# Patient Record
Sex: Female | Born: 1935 | Race: White | Hispanic: No | Marital: Married | Smoking: Former smoker
Health system: Southern US, Community
[De-identification: ages and names within clinical notes are randomized; demographics above are authoritative.]

## PROBLEM LIST (undated history)

## (undated) DIAGNOSIS — Z9989 Dependence on other enabling machines and devices: Secondary | ICD-10-CM

## (undated) DIAGNOSIS — I5042 Chronic combined systolic (congestive) and diastolic (congestive) heart failure: Secondary | ICD-10-CM

## (undated) DIAGNOSIS — N183 Chronic kidney disease, stage 3 unspecified: Secondary | ICD-10-CM

## (undated) DIAGNOSIS — I350 Nonrheumatic aortic (valve) stenosis: Secondary | ICD-10-CM

## (undated) DIAGNOSIS — G4733 Obstructive sleep apnea (adult) (pediatric): Secondary | ICD-10-CM

## (undated) DIAGNOSIS — I739 Peripheral vascular disease, unspecified: Secondary | ICD-10-CM

## (undated) DIAGNOSIS — D649 Anemia, unspecified: Secondary | ICD-10-CM

## (undated) DIAGNOSIS — M19072 Primary osteoarthritis, left ankle and foot: Secondary | ICD-10-CM

## (undated) DIAGNOSIS — E559 Vitamin D deficiency, unspecified: Secondary | ICD-10-CM

## (undated) DIAGNOSIS — I4819 Other persistent atrial fibrillation: Secondary | ICD-10-CM

## (undated) DIAGNOSIS — I779 Disorder of arteries and arterioles, unspecified: Secondary | ICD-10-CM

## (undated) DIAGNOSIS — M19071 Primary osteoarthritis, right ankle and foot: Secondary | ICD-10-CM

## (undated) DIAGNOSIS — E119 Type 2 diabetes mellitus without complications: Secondary | ICD-10-CM

## (undated) DIAGNOSIS — I10 Essential (primary) hypertension: Secondary | ICD-10-CM

## (undated) HISTORY — DX: Chronic kidney disease, stage 3 unspecified: N18.30

## (undated) HISTORY — DX: Chronic kidney disease, stage 3 (moderate): N18.3

## (undated) HISTORY — PX: CHOLECYSTECTOMY: SHX55

## (undated) HISTORY — DX: Essential (primary) hypertension: I10

## (undated) HISTORY — DX: Chronic combined systolic (congestive) and diastolic (congestive) heart failure: I50.42

## (undated) HISTORY — DX: Other persistent atrial fibrillation: I48.19

## (undated) HISTORY — DX: Nonrheumatic aortic (valve) stenosis: I35.0

---

## 2014-08-23 ENCOUNTER — Inpatient Hospital Stay (HOSPITAL_COMMUNITY)
Admission: EM | Admit: 2014-08-23 | Discharge: 2014-08-28 | DRG: 308 | Disposition: A | Discharge status: Home / Self Care | Payer: Medicare Other | Attending: Cardiology | Admitting: Cardiology

## 2014-08-23 ENCOUNTER — Encounter (HOSPITAL_COMMUNITY): Payer: Self-pay | Admitting: Emergency Medicine

## 2014-08-23 ENCOUNTER — Emergency Department (HOSPITAL_COMMUNITY): Payer: Medicare Other

## 2014-08-23 DIAGNOSIS — M19071 Primary osteoarthritis, right ankle and foot: Secondary | ICD-10-CM | POA: Diagnosis present

## 2014-08-23 DIAGNOSIS — M19072 Primary osteoarthritis, left ankle and foot: Secondary | ICD-10-CM | POA: Diagnosis present

## 2014-08-23 DIAGNOSIS — N184 Chronic kidney disease, stage 4 (severe): Secondary | ICD-10-CM | POA: Diagnosis present

## 2014-08-23 DIAGNOSIS — Z87891 Personal history of nicotine dependence: Secondary | ICD-10-CM | POA: Diagnosis not present

## 2014-08-23 DIAGNOSIS — I4891 Unspecified atrial fibrillation: Secondary | ICD-10-CM | POA: Diagnosis not present

## 2014-08-23 DIAGNOSIS — Z79899 Other long term (current) drug therapy: Secondary | ICD-10-CM

## 2014-08-23 DIAGNOSIS — I481 Persistent atrial fibrillation: Secondary | ICD-10-CM | POA: Diagnosis present

## 2014-08-23 DIAGNOSIS — E6609 Other obesity due to excess calories: Secondary | ICD-10-CM | POA: Diagnosis present

## 2014-08-23 DIAGNOSIS — K5900 Constipation, unspecified: Secondary | ICD-10-CM | POA: Diagnosis present

## 2014-08-23 DIAGNOSIS — M79605 Pain in left leg: Secondary | ICD-10-CM | POA: Diagnosis not present

## 2014-08-23 DIAGNOSIS — I10 Essential (primary) hypertension: Secondary | ICD-10-CM | POA: Diagnosis not present

## 2014-08-23 DIAGNOSIS — Q231 Congenital insufficiency of aortic valve: Secondary | ICD-10-CM

## 2014-08-23 DIAGNOSIS — D649 Anemia, unspecified: Secondary | ICD-10-CM | POA: Diagnosis present

## 2014-08-23 DIAGNOSIS — I5031 Acute diastolic (congestive) heart failure: Secondary | ICD-10-CM | POA: Diagnosis present

## 2014-08-23 DIAGNOSIS — E119 Type 2 diabetes mellitus without complications: Secondary | ICD-10-CM | POA: Diagnosis present

## 2014-08-23 DIAGNOSIS — I4510 Unspecified right bundle-branch block: Secondary | ICD-10-CM | POA: Diagnosis present

## 2014-08-23 DIAGNOSIS — N183 Chronic kidney disease, stage 3 (moderate): Secondary | ICD-10-CM

## 2014-08-23 DIAGNOSIS — E559 Vitamin D deficiency, unspecified: Secondary | ICD-10-CM | POA: Diagnosis present

## 2014-08-23 DIAGNOSIS — R609 Edema, unspecified: Secondary | ICD-10-CM

## 2014-08-23 DIAGNOSIS — I77810 Thoracic aortic ectasia: Secondary | ICD-10-CM | POA: Diagnosis present

## 2014-08-23 DIAGNOSIS — M25561 Pain in right knee: Secondary | ICD-10-CM | POA: Diagnosis not present

## 2014-08-23 DIAGNOSIS — Z794 Long term (current) use of insulin: Secondary | ICD-10-CM

## 2014-08-23 DIAGNOSIS — R0609 Other forms of dyspnea: Secondary | ICD-10-CM | POA: Diagnosis present

## 2014-08-23 DIAGNOSIS — E785 Hyperlipidemia, unspecified: Secondary | ICD-10-CM | POA: Diagnosis present

## 2014-08-23 DIAGNOSIS — G4733 Obstructive sleep apnea (adult) (pediatric): Secondary | ICD-10-CM | POA: Diagnosis present

## 2014-08-23 DIAGNOSIS — Z6838 Body mass index (BMI) 38.0-38.9, adult: Secondary | ICD-10-CM

## 2014-08-23 DIAGNOSIS — I129 Hypertensive chronic kidney disease with stage 1 through stage 4 chronic kidney disease, or unspecified chronic kidney disease: Secondary | ICD-10-CM | POA: Diagnosis present

## 2014-08-23 HISTORY — DX: Primary osteoarthritis, right ankle and foot: M19.072

## 2014-08-23 HISTORY — DX: Disorder of arteries and arterioles, unspecified: I77.9

## 2014-08-23 HISTORY — DX: Anemia, unspecified: D64.9

## 2014-08-23 HISTORY — DX: Obstructive sleep apnea (adult) (pediatric): G47.33

## 2014-08-23 HISTORY — DX: Type 2 diabetes mellitus without complications: E11.9

## 2014-08-23 HISTORY — DX: Dependence on other enabling machines and devices: Z99.89

## 2014-08-23 HISTORY — DX: Vitamin D deficiency, unspecified: E55.9

## 2014-08-23 HISTORY — DX: Primary osteoarthritis, right ankle and foot: M19.071

## 2014-08-23 HISTORY — DX: Peripheral vascular disease, unspecified: I73.9

## 2014-08-23 LAB — ABO/RH: ABO/RH(D): O POS

## 2014-08-23 LAB — COMPREHENSIVE METABOLIC PANEL
ALT: 14 U/L (ref 14–54)
ANION GAP: 9 (ref 5–15)
AST: 16 U/L (ref 15–41)
Albumin: 3.7 g/dL (ref 3.5–5.0)
Alkaline Phosphatase: 58 U/L (ref 38–126)
BUN: 16 mg/dL (ref 6–20)
CHLORIDE: 105 mmol/L (ref 101–111)
CO2: 24 mmol/L (ref 22–32)
Calcium: 8.8 mg/dL — ABNORMAL LOW (ref 8.9–10.3)
Creatinine, Ser: 1.15 mg/dL — ABNORMAL HIGH (ref 0.44–1.00)
GFR calc Af Amer: 52 mL/min — ABNORMAL LOW (ref >60)
GFR, EST NON AFRICAN AMERICAN: 45 mL/min — AB (ref >60)
Glucose, Bld: 150 mg/dL — ABNORMAL HIGH (ref 65–99)
Potassium: 4.2 mmol/L (ref 3.5–5.1)
Sodium: 138 mmol/L (ref 135–145)
TOTAL PROTEIN: 6.7 g/dL (ref 6.5–8.1)
Total Bilirubin: 0.5 mg/dL (ref 0.3–1.2)

## 2014-08-23 LAB — URINE MICROSCOPIC-ADD ON

## 2014-08-23 LAB — URINALYSIS, ROUTINE W REFLEX MICROSCOPIC
Bilirubin Urine: NEGATIVE
Glucose, UA: NEGATIVE mg/dL
Hgb urine dipstick: NEGATIVE
KETONES UR: NEGATIVE mg/dL
Nitrite: NEGATIVE
PH: 6.5 (ref 5.0–8.0)
PROTEIN: NEGATIVE mg/dL
Specific Gravity, Urine: 1.007 (ref 1.005–1.030)
UROBILINOGEN UA: 0.2 mg/dL (ref 0.0–1.0)

## 2014-08-23 LAB — PROTIME-INR
INR: 1.32 (ref 0.00–1.49)
PROTHROMBIN TIME: 16.5 s — AB (ref 11.6–15.2)

## 2014-08-23 LAB — CBC WITH DIFFERENTIAL/PLATELET
Basophils Absolute: 0.1 10*3/uL (ref 0.0–0.1)
Basophils Relative: 1 % (ref 0–1)
Eosinophils Absolute: 0.4 10*3/uL (ref 0.0–0.7)
Eosinophils Relative: 6 % — ABNORMAL HIGH (ref 0–5)
HCT: 36.8 % (ref 36.0–46.0)
Hemoglobin: 11.6 g/dL — ABNORMAL LOW (ref 12.0–15.0)
LYMPHS ABS: 1.9 10*3/uL (ref 0.7–4.0)
Lymphocytes Relative: 27 % (ref 12–46)
MCH: 27.4 pg (ref 26.0–34.0)
MCHC: 31.5 g/dL (ref 30.0–36.0)
MCV: 87 fL (ref 78.0–100.0)
MONOS PCT: 6 % (ref 3–12)
Monocytes Absolute: 0.4 10*3/uL (ref 0.1–1.0)
Neutro Abs: 4.3 10*3/uL (ref 1.7–7.7)
Neutrophils Relative %: 60 % (ref 43–77)
Platelets: 238 10*3/uL (ref 150–400)
RBC: 4.23 MIL/uL (ref 3.87–5.11)
RDW: 14.1 % (ref 11.5–15.5)
WBC: 7 10*3/uL (ref 4.0–10.5)

## 2014-08-23 LAB — BRAIN NATRIURETIC PEPTIDE: B Natriuretic Peptide: 234.7 pg/mL — ABNORMAL HIGH (ref 0.0–100.0)

## 2014-08-23 LAB — TROPONIN I
Troponin I: <0.03 ng/mL (ref <0.031)
Troponin I: <0.03 ng/mL (ref <0.031)

## 2014-08-23 LAB — GLUCOSE, CAPILLARY
GLUCOSE-CAPILLARY: 84 mg/dL (ref 65–99)
GLUCOSE-CAPILLARY: 105 mg/dL — AB (ref 65–99)

## 2014-08-23 LAB — TYPE AND SCREEN
ABO/RH(D): O POS
Antibody Screen: NEGATIVE

## 2014-08-23 LAB — MRSA PCR SCREENING: MRSA by PCR: NEGATIVE

## 2014-08-23 MED ORDER — METOPROLOL TARTRATE 12.5 MG HALF TABLET
12.5000 mg | ORAL_TABLET | Freq: Two times a day (BID) | ORAL | Status: DC
  Administered 2014-08-23: 12.5 mg via ORAL
  Filled 2014-08-23 (×3): qty 1

## 2014-08-23 MED ORDER — INSULIN ASPART 100 UNIT/ML ~~LOC~~ SOLN
0.0000 [IU] | Freq: Every day | SUBCUTANEOUS | Status: DC
  Administered 2014-08-25: 3 [IU] via SUBCUTANEOUS
  Administered 2014-08-26: 2 [IU] via SUBCUTANEOUS
  Administered 2014-08-27 (×2): 3 [IU] via SUBCUTANEOUS

## 2014-08-23 MED ORDER — HEPARIN BOLUS VIA INFUSION
4000.0000 [IU] | Freq: Once | INTRAVENOUS | Status: AC
  Administered 2014-08-23: 4000 [IU] via INTRAVENOUS
  Filled 2014-08-23: qty 4000

## 2014-08-23 MED ORDER — DILTIAZEM HCL 100 MG IV SOLR
5.0000 mg/h | INTRAVENOUS | Status: DC
  Administered 2014-08-23: 7.5 mg/h via INTRAVENOUS
  Administered 2014-08-23: 5 mg/h via INTRAVENOUS

## 2014-08-23 MED ORDER — HEPARIN (PORCINE) IN NACL 100-0.45 UNIT/ML-% IJ SOLN
1000.0000 [IU]/h | INTRAMUSCULAR | Status: DC
  Administered 2014-08-23: 1000 [IU]/h via INTRAVENOUS
  Filled 2014-08-23 (×2): qty 250

## 2014-08-23 MED ORDER — FUROSEMIDE 10 MG/ML IJ SOLN
40.0000 mg | Freq: Once | INTRAMUSCULAR | Status: AC
  Administered 2014-08-23: 40 mg via INTRAVENOUS
  Filled 2014-08-23: qty 4

## 2014-08-23 MED ORDER — ACETAMINOPHEN 325 MG PO TABS
650.0000 mg | ORAL_TABLET | ORAL | Status: DC | PRN
  Administered 2014-08-23: 650 mg via ORAL
  Filled 2014-08-23: qty 2

## 2014-08-23 MED ORDER — SODIUM CHLORIDE 0.9 % IV SOLN: INTRAVENOUS | Status: DC

## 2014-08-23 MED ORDER — INSULIN ASPART 100 UNIT/ML ~~LOC~~ SOLN
0.0000 [IU] | Freq: Three times a day (TID) | SUBCUTANEOUS | Status: DC
  Administered 2014-08-24: 2 [IU] via SUBCUTANEOUS
  Administered 2014-08-24: 5 [IU] via SUBCUTANEOUS
  Administered 2014-08-25: 2 [IU] via SUBCUTANEOUS
  Administered 2014-08-25: 7 [IU] via SUBCUTANEOUS
  Administered 2014-08-25: 2 [IU] via SUBCUTANEOUS
  Administered 2014-08-26 – 2014-08-27 (×4): 3 [IU] via SUBCUTANEOUS
  Administered 2014-08-27: 2 [IU] via SUBCUTANEOUS
  Administered 2014-08-27: 1 [IU] via SUBCUTANEOUS
  Administered 2014-08-28: 2 [IU] via SUBCUTANEOUS

## 2014-08-23 MED ORDER — ONDANSETRON HCL 4 MG/2ML IJ SOLN: 4.0000 mg | Freq: Four times a day (QID) | INTRAMUSCULAR | Status: DC | PRN

## 2014-08-23 MED ORDER — ASPIRIN EC 81 MG PO TBEC
81.0000 mg | DELAYED_RELEASE_TABLET | Freq: Every day | ORAL | Status: DC
  Administered 2014-08-23 – 2014-08-28 (×6): 81 mg via ORAL
  Filled 2014-08-23 (×6): qty 1

## 2014-08-23 MED ORDER — ALPRAZOLAM 0.25 MG PO TABS
0.2500 mg | ORAL_TABLET | Freq: Two times a day (BID) | ORAL | Status: DC | PRN
  Administered 2014-08-23: 0.25 mg via ORAL

## 2014-08-23 MED ORDER — DILTIAZEM LOAD VIA INFUSION
15.0000 mg | Freq: Once | INTRAVENOUS | Status: AC
  Administered 2014-08-23: 15 mg via INTRAVENOUS
  Filled 2014-08-23: qty 15

## 2014-08-23 NOTE — H&P (Signed)
Lisa [name] is an 79 y.o. female.    Primary Cardiologist:new  No primary care provider on file. followed at [place] Complaint:  HPI: 79 year old female with hx heart murmur, OSA with CPAP that she uses at times, +HTN and diabetes, from [place] speaks [place] as does her husband, presents from PCP - She was there for Echo for DOE at [facility] and found to be in a fib with RVR at 165.  Was given adenosine 6 mg then 12 mg by EMS without change.  Now on IV dilt HR has improved to 108.    With interpretor she states she had significant fatigue about 10 days ago.  No cardiac Hx. And no strokes.  Today she went for Echo, she does have some DOE.  Troponin pending.  . EKG A fib with RVR and RBBB & LPFB.   On visit at [facility] she denies chest pain some DOE and no awareness or racing HR.    Last Nuc 2011 per their records, I do not have results.     Past Medical History  Diagnosis Date  . CHF (congestive heart failure)   . Anemia   . Hypertension   . Diabetes mellitus without complication   . Heart murmur   . Vitamin D deficiency   . CKD (chronic kidney disease) stage 4, GFR 15-29 ml/min     here stage 3  . Carotid arterial disease   . Osteoarthritis of both ankles   . H/O aortic valve disorder   . OSA on CPAP     Past Surgical History  Procedure Laterality Date  . Cholecystectomy      History reviewed. No pertinent family history.- no CAD  Social History:  reports that she has quit smoking. She has never used smokeless tobacco. She reports that she does not drink alcohol or use illicit drugs.  Married and her husband is in [place] as well.  Allergies: No Known Allergies  OUTPATIENT MEDICATIONS: No current facility-administered medications on file prior to encounter.   No current outpatient prescriptions on file prior to encounter.   On INSULIN ONCE a Day. Water pill prn HTN meds.   Results for orders placed or performed during the hospital  encounter of 08/23/14 (from the past 48 hour(s))  Comprehensive metabolic panel     Status: Abnormal   Collection Time: 08/23/14  4:33 PM  Result Value Ref Range   Sodium 138 135 - 145 mmol/L   Potassium 4.2 3.5 - 5.1 mmol/L   Chloride 105 101 - 111 mmol/L   CO2 24 22 - 32 mmol/L   Glucose, Bld 150 (H) 65 - 99 mg/dL   BUN 16 6 - 20 mg/dL   Creatinine, Ser 1.15 (H) 0.44 - 1.00 mg/dL   Calcium 8.8 (L) 8.9 - 10.3 mg/dL   Total Protein 6.7 6.5 - 8.1 g/dL   Albumin 3.7 3.5 - 5.0 g/dL   AST 16 15 - 41 U/L   ALT 14 14 - 54 U/L   Alkaline Phosphatase 58 38 - 126 U/L   Total Bilirubin 0.5 0.3 - 1.2 mg/dL   GFR calc non Af Amer 45 (L) >60 mL/min   GFR calc Af Amer 52 (L) >60 mL/min    Comment: (NOTE) The eGFR has been calculated using the CKD EPI equation. This calculation has not been validated in all clinical situations. eGFR's persistently <60 mL/min signify possible Chronic Kidney Disease.  Anion gap 9 5 - 15  Brain natriuretic peptide     Status: Abnormal   Collection Time: 08/23/14  4:33 PM  Result Value Ref Range   B Natriuretic Peptide 234.7 (H) 0.0 - 100.0 pg/mL  Troponin I     Status: None   Collection Time: 08/23/14  4:33 PM  Result Value Ref Range   Troponin I <0.03 <0.031 ng/mL    Comment:        NO INDICATION OF MYOCARDIAL INJURY.   CBC with Differential     Status: Abnormal   Collection Time: 08/23/14  4:33 PM  Result Value Ref Range   WBC 7.0 4.0 - 10.5 K/uL   RBC 4.23 3.87 - 5.11 MIL/uL   Hemoglobin 11.6 (L) 12.0 - 15.0 g/dL   HCT 36.8 36.0 - 46.0 %   MCV 87.0 78.0 - 100.0 fL   MCH 27.4 26.0 - 34.0 pg   MCHC 31.5 30.0 - 36.0 g/dL   RDW 14.1 11.5 - 15.5 %   Platelets 238 150 - 400 K/uL   Neutrophils Relative % 60 43 - 77 %   Neutro Abs 4.3 1.7 - 7.7 K/uL   Lymphocytes Relative 27 12 - 46 %   Lymphs Abs 1.9 0.7 - 4.0 K/uL   Monocytes Relative 6 3 - 12 %   Monocytes Absolute 0.4 0.1 - 1.0 K/uL   Eosinophils Relative 6 (H) 0 - 5 %   Eosinophils Absolute  0.4 0.0 - 0.7 K/uL   Basophils Relative 1 0 - 1 %   Basophils Absolute 0.1 0.0 - 0.1 K/uL  Protime-INR     Status: Abnormal   Collection Time: 08/23/14  4:33 PM  Result Value Ref Range   Prothrombin Time 16.5 (H) 11.6 - 15.2 seconds   INR 1.32 0.00 - 1.49  Type and screen     Status: None (Preliminary result)   Collection Time: 08/23/14  4:53 PM  Result Value Ref Range   ABO/RH(D) O POS    Antibody Screen PENDING    Sample Expiration 08/26/2014    Dg Chest Port 1 View  08/23/2014   CLINICAL DATA:  Dyspnea.  Tachycardia.  Congestive heart failure.  EXAM: PORTABLE CHEST - 1 VIEW  COMPARISON:  None.  FINDINGS: Large patient habitus noted. Moderate cardiomegaly demonstrated. Diffuse interstitial prominence is suspicious for mild interstitial edema. No evidence of pulmonary consolidation.  IMPRESSION: Cardiomegaly and diffuse interstitial prominence suspicious for mild edema/congestive heart failure. No focal consolidation.   Electronically Signed   By: Earle Gell M.D.   On: 08/23/2014 17:41    ROS: Pending- pt does not speak [place], with translator. General:no colds or fevers, no weight changes Skin:no rashes or ulcers HEENT:no blurred vision, no congestion CV:see HPI PUL:see HPI GI:no diarrhea constipation or melena, no indigestion GU:no hematuria, no dysuria MS:no joint pain, no claudication, fx arm awhile back after tripping.  Neuro:no syncope, no lightheadedness Endo:+ diabetes, no thyroid disease   Blood pressure 106/89, pulse 98, temperature 98.2 F (36.8 C), temperature source Oral, resp. rate 26, SpO2 93 %. PE: General:Pleasant affect, NAD, though she becomes anxious when I put her head down briefly- to check JVD.,  Skin:Warm and dry, brisk capillary refill HEENT:normocephalic, sclera clear, mucus membranes moist Neck:supple, tr JVD flat, Increase of SOB, no bruits  Heart:Irreg irreg with soft murmur, no gallup, rub or click, chest wall with circular burn + Electrode burn  from clinic.   Lungs: with rales in bases, no rhonchi, or  wheezes KXF:GHWEX, soft, non tender, + BS, do not palpate liver spleen or masses Ext:tr lower ext edema, 2+ pedal pulses, 2+ radial pulses Neuro:appears alert and oriented answers questions approp. With interpreter . MAE, follows commands, + facial symmetry    Assessment/Plan Principal Problem:   Atrial fibrillation with RVR- now on dilt drip with improvement of HR. Troponin pending.    CBC stable. INR 1.32. Would begin IV heparin CHA2DS2Vasc score of 5.  Continue dilt.  Echo to eval. With her DOE.  Follow serial troponins. She is on HTN meds, insulin once a day and prn water pill.  Will ask family to bring in meds.   Edema will give IV LAsix.   CKD is actually 3 today.          [place] Nurse Practitioner Certified [place] Pager [phone] or after 5pm or weekends call [phone] 08/23/2014, 5:50 PM  As above, patient seen and examined. Briefly she is a 79 year old female with past medical history of diabetes mellitus and hypertension with new onset atrial fibrillation. Patient does not speak [place] and history is obtained with the assistance of an interpreter. Patient complains of dyspnea on exertion for approximately 10 days. No chest pain or palpitations. Seen in [facility] for above symptoms and found to be in atrial fibrillation and transferred. Electrocardiogram shows atrial fibrillation with right bundle branch block. Plan to admit. Check echocardiogram and TSH. Control heart rate with Cardizem. Embolic risk factors of age greater than 63, diabetes mellitus, hypertension and female sex. CHADSvasc 5. Will treat with IV heparin. If LV function normal and no further procedure necessary would then transition to apixaban. If rate control and symptoms improved could plan outpatient cardioversion in approximately 4 weeks. If she does not convert and rate is difficult to control could proceed with TEE  guided cardioversion. She is mildly volume overloaded from her atrial fibrillation. We'll gently diurese and follow renal function. Kirk Ruths

## 2014-08-23 NOTE — ED Notes (Addendum)
Per ems: pt from pcp for eval of tachycardia, pt went in for regular check up and was found to be in afib with HR of 146 initially, EMS administered 6mg  then 12 mg of adenosine with minimal change on heart monitor. Pt has no complaints, nad noted. Pt speaks ser[place].

## 2014-08-23 NOTE — ED Notes (Signed)
Pt placed in a gown and hooked up to the monitor with the 5 lead, BP cuff and pulse ox 

## 2014-08-23 NOTE — ED Notes (Signed)
[place] interpreter at bedside, Dr. Stanford Breed at bedside.

## 2014-08-23 NOTE — Progress Notes (Signed)
ANTICOAGULATION CONSULT NOTE - Initial Consult  Pharmacy Consult for heparin  Indication: atrial fibrillation  No Known Allergies  Patient Measurements: Weight: 213 lb (96.616 kg) Heparin Dosing Weight: 72.8 kg  Vital Signs: Temp: 98.2 F (36.8 C) (07/20 1635) Temp Source: Oral (07/20 1635) BP: 117/65 mmHg (07/20 1833) Pulse Rate: 130 (07/20 1833)  Labs:  Recent Labs  08/23/14 1633  HGB 11.6*  HCT 36.8  PLT 238  LABPROT 16.5*  INR 1.32  CREATININE 1.15*  TROPONINI <0.03    CrCl cannot be calculated (Unknown ideal weight.).   Medical History: Past Medical History  Diagnosis Date  . CHF (congestive heart failure)   . Anemia   . Hypertension   . Diabetes mellitus without complication   . Heart murmur   . Vitamin D deficiency   . CKD (chronic kidney disease) stage 4, GFR 15-29 ml/min     here stage 3  . Carotid arterial disease   . Osteoarthritis of both ankles   . H/O aortic valve disorder   . OSA on CPAP     Medications:   (Not in a hospital admission)  Assessment: 79 yo female with new onset AFib with RVR, rec'd adenosine pta by ems without respone, now on dilt gtt. Initiating heparin gtt, hgb 11.6, plts wnl.  Goal of Therapy:  Heparin level 0.3-0.7 units/ml Monitor platelets by anticoagulation protocol: Yes   Plan:  Heparin 4000 units x1 then 1000 units/hr Daily HL, CBC, check first level tonight Monitor s/sx bleeding     Hughes Better, PharmD, BCPS Clinical Pharmacist 08/23/2014 6:56 PM

## 2014-08-23 NOTE — ED Provider Notes (Signed)
CSN: [id number]     Arrival date & time 08/23/14  1621 History   First MD Initiated Contact with Patient 08/23/14 1623     Chief Complaint  Patient presents with  . Tachycardia     (Consider location/radiation/quality/duration/timing/severity/associated sxs/prior Treatment) HPI Significant language barrier. Interpreter phone used however interpreter was having difficulty with communication with patient. Most history is from the outpatient cardiology note brought with the patient. She was having an echocardiogram done at [facility] cardiology in [facility], she was noted to have normal cardiac anatomy but left ventricular EF low range of 45-50%. She was in H of fibrillation with rapid ventricular response and thus transferred via EMS to the emergency department for treatment. The note indicates that the patient was being seen for shortness of breath that was increased with exertion and lying supine. Via the interpreter phone the patient denies chest pain. There is no documentation of fever or productive cough. The note from the cardiology clinic indicates that the patient is taking her Eliiquis, but not a prescribed beta blocker. Past Medical History  Diagnosis Date  . CHF (congestive heart failure)   . Anemia   . Hypertension   . Diabetes mellitus without complication   . Heart murmur   . Vitamin D deficiency   . CKD (chronic kidney disease) stage 4, GFR 15-29 ml/min     here stage 3  . Carotid arterial disease   . Osteoarthritis of both ankles   . H/O aortic valve disorder   . OSA on CPAP    Past Surgical History  Procedure Laterality Date  . Cholecystectomy     History reviewed. No pertinent family history. History  Substance Use Topics  . Smoking status: Former Research scientist (life sciences)  . Smokeless tobacco: Never Used  . Alcohol Use: No   OB History    No data available     Review of Systems Unable to obtain review of systems due to communication barrier despite interpreter phone. View  systems provided in the outpatient note is reviewed without positive for.   Allergies  Review of patient's allergies indicates no known allergies.  Home Medications   Prior to Admission medications   Not on File   BP 126/70 mmHg  Pulse 85  Temp(Src) 97.6 F (36.4 C) (Oral)  Resp 23  Wt 213 lb (96.616 kg)  SpO2 97% Physical Exam  Constitutional:  Patient is moderately obese. She is alert and nontoxic. She does not have acute respiratory distress. She appears slightly pale and diaphoretic.  HENT:  Head: Normocephalic and atraumatic.  Eyes: EOM are normal. Pupils are equal, round, and reactive to light.  Neck: Neck supple.  Cardiovascular:  Tachycardic irregularly irregular heart rhythm.  Pulmonary/Chest: Effort normal and breath sounds normal. No respiratory distress.  Abdominal: Soft. Bowel sounds are normal. She exhibits no distension. There is no tenderness.  Musculoskeletal:  1-2+ pitting edema bilateral lower extremity.  Neurological: She is alert. No cranial nerve deficit. She exhibits normal muscle tone. Coordination normal.  Skin: Skin is warm and dry. There is pallor.    ED Course  Procedures (including critical care time) Labs Review Labs Reviewed  COMPREHENSIVE METABOLIC PANEL - Abnormal; Notable for the following:    Glucose, Bld 150 (*)    Creatinine, Ser 1.15 (*)    Calcium 8.8 (*)    GFR calc non Af Amer 45 (*)    GFR calc Af Amer 52 (*)    All other components within normal limits  BRAIN NATRIURETIC  PEPTIDE - Abnormal; Notable for the following:    B Natriuretic Peptide 234.7 (*)    All other components within normal limits  CBC WITH DIFFERENTIAL/PLATELET - Abnormal; Notable for the following:    Hemoglobin 11.6 (*)    Eosinophils Relative 6 (*)    All other components within normal limits  PROTIME-INR - Abnormal; Notable for the following:    Prothrombin Time 16.5 (*)    All other components within normal limits  URINALYSIS, ROUTINE W REFLEX  MICROSCOPIC (NOT AT [facility]) - Abnormal; Notable for the following:    Leukocytes, UA TRACE (*)    All other components within normal limits  GLUCOSE, CAPILLARY - Abnormal; Notable for the following:    Glucose-Capillary 105 (*)    All other components within normal limits  MRSA PCR SCREENING  TROPONIN I  TROPONIN I  URINE MICROSCOPIC-ADD ON  TSH  GLUCOSE, CAPILLARY  TROPONIN I  TROPONIN I  CBC  HEPARIN LEVEL (UNFRACTIONATED)  MAGNESIUM  T4, FREE  HEMOGLOBIN [id number]  BASIC METABOLIC PANEL  LIPID PANEL  TYPE AND SCREEN  ABO/RH    Imaging Review Dg Chest Port 1 View  08/23/2014   CLINICAL DATA:  Dyspnea.  Tachycardia.  Congestive heart failure.  EXAM: PORTABLE CHEST - 1 VIEW  COMPARISON:  None.  FINDINGS: Large patient habitus noted. Moderate cardiomegaly demonstrated. Diffuse interstitial prominence is suspicious for mild interstitial edema. No evidence of pulmonary consolidation.  IMPRESSION: Cardiomegaly and diffuse interstitial prominence suspicious for mild edema/congestive heart failure. No focal consolidation.   Electronically Signed   By: Earle Gell M.D.   On: 08/23/2014 17:41     EKG Interpretation   Date/Time:  Wednesday August 23 2014 17:08:15 EDT Ventricular Rate:  102 PR Interval:  176 QRS Duration: 144 QT Interval:  383 QTC Calculation: 499 R Axis:   88 Text Interpretation:  Sinus tachycardia with irregular rate Right atrial  enlargement Right bundle branch block agree. rate improved Confirmed by  Johnney Killian, MD, Jeannie Done [phone]) on 08/23/2014 5:53:03 PM     CRITICAL CARE Performed by: Charlesetta Shanks   Total critical care time: 45  Critical care time was exclusive of separately billable procedures and treating other patients.  Critical care was necessary to treat or prevent imminent or life-threatening deterioration.  Critical care was time spent personally by me on the following activities: development of treatment plan with patient and/or surrogate as well as  nursing, discussions with consultants, evaluation of patient's response to treatment, examination of patient, obtaining history from patient or surrogate, ordering and performing treatments and interventions, ordering and review of laboratory studies, ordering and review of radiographic studies, pulse oximetry and re-evaluation of patient's condition. MDM   Final diagnoses:  Atrial fibrillation with rapid ventricular response   Treatment was initiated with Cardizem bolus and drip. Patient responded positively to this. She denied active chest pain at the time of initiated treatment. Cardiology was consults it for further management.    Charlesetta Shanks, MD 08/24/14 [phone]

## 2014-08-23 NOTE — Progress Notes (Signed)
    Called to see patient by nursing. Patient shifting in bed, trying to get out of bed, indicating pain in her left leg behind the knee. Communication very limited, she is from [place] and speaks [place] only. No family members present, phone call placed to her son who translated for [place]. She stated that she was having a pain behind her left knee that happened suddenly and was already resolving around the time that I assessed her. She indicated that this does happen periodically. There was concern by nursing that there was an area of induration behind the knee, although not overly tender on my examination, distal pulses one plus, no definite cords. Etiology not clear, possibly a cramp or potentially Baker's cyst. DVT felt to be less likely, she is being placed on heparin in any event with associated recent diagnosis of atrial fibrillation. Patient being treated with analgesics. Continue to monitor. We will order lower extremity venous Dopplers for tomorrow morning.  Satira Sark, M.D., F.A.C.C.

## 2014-08-24 ENCOUNTER — Inpatient Hospital Stay (HOSPITAL_COMMUNITY): Payer: Medicare Other

## 2014-08-24 DIAGNOSIS — I4891 Unspecified atrial fibrillation: Secondary | ICD-10-CM

## 2014-08-24 DIAGNOSIS — I10 Essential (primary) hypertension: Secondary | ICD-10-CM

## 2014-08-24 DIAGNOSIS — M79605 Pain in left leg: Secondary | ICD-10-CM

## 2014-08-24 LAB — GLUCOSE, CAPILLARY
GLUCOSE-CAPILLARY: 190 mg/dL — AB (ref 65–99)
GLUCOSE-CAPILLARY: 267 mg/dL — AB (ref 65–99)
GLUCOSE-CAPILLARY: 173 mg/dL — AB (ref 65–99)
Glucose-Capillary: 89 mg/dL (ref 65–99)

## 2014-08-24 LAB — LIPID PANEL
Cholesterol: 128 mg/dL (ref 0–200)
HDL: 30 mg/dL — ABNORMAL LOW (ref >40)
LDL Cholesterol: 83 mg/dL (ref 0–99)
Total CHOL/HDL Ratio: 4.3 RATIO
Triglycerides: 77 mg/dL (ref <150)
VLDL: 15 mg/dL (ref 0–40)

## 2014-08-24 LAB — BASIC METABOLIC PANEL
ANION GAP: 8 (ref 5–15)
BUN: 17 mg/dL (ref 6–20)
CHLORIDE: 105 mmol/L (ref 101–111)
CO2: 27 mmol/L (ref 22–32)
Calcium: 8.9 mg/dL (ref 8.9–10.3)
Creatinine, Ser: 1.16 mg/dL — ABNORMAL HIGH (ref 0.44–1.00)
GFR calc Af Amer: 52 mL/min — ABNORMAL LOW (ref >60)
GFR calc non Af Amer: 45 mL/min — ABNORMAL LOW (ref >60)
Glucose, Bld: 134 mg/dL — ABNORMAL HIGH (ref 65–99)
Potassium: 4.1 mmol/L (ref 3.5–5.1)
Sodium: 140 mmol/L (ref 135–145)

## 2014-08-24 LAB — MAGNESIUM: Magnesium: 2.1 mg/dL (ref 1.7–2.4)

## 2014-08-24 LAB — HEPARIN LEVEL (UNFRACTIONATED)
HEPARIN UNFRACTIONATED: 1.02 [IU]/mL — AB (ref 0.30–0.70)
Heparin Unfractionated: 1.44 IU/mL — ABNORMAL HIGH (ref 0.30–0.70)
Heparin Unfractionated: 1.42 IU/mL — ABNORMAL HIGH (ref 0.30–0.70)

## 2014-08-24 LAB — T4, FREE: Free T4: 1.29 ng/dL — ABNORMAL HIGH (ref 0.61–1.12)

## 2014-08-24 LAB — CBC
HEMATOCRIT: 35 % — AB (ref 36.0–46.0)
Hemoglobin: 10.9 g/dL — ABNORMAL LOW (ref 12.0–15.0)
MCH: 27.1 pg (ref 26.0–34.0)
MCHC: 31.1 g/dL (ref 30.0–36.0)
MCV: 87.1 fL (ref 78.0–100.0)
PLATELETS: 215 10*3/uL (ref 150–400)
RBC: 4.02 MIL/uL (ref 3.87–5.11)
RDW: 14 % (ref 11.5–15.5)
WBC: 7.3 10*3/uL (ref 4.0–10.5)

## 2014-08-24 LAB — TSH: TSH: 2.057 u[IU]/mL (ref 0.350–4.500)

## 2014-08-24 LAB — TROPONIN I: Troponin I: <0.03 ng/mL (ref <0.031)

## 2014-08-24 MED ORDER — HEPARIN (PORCINE) IN NACL 100-0.45 UNIT/ML-% IJ SOLN
500.0000 [IU]/h | INTRAMUSCULAR | Status: AC
  Administered 2014-08-25: 500 [IU]/h via INTRAVENOUS
  Filled 2014-08-24: qty 250

## 2014-08-24 MED ORDER — METOPROLOL TARTRATE 50 MG PO TABS
50.0000 mg | ORAL_TABLET | Freq: Two times a day (BID) | ORAL | Status: DC
  Administered 2014-08-24 (×2): 50 mg via ORAL
  Filled 2014-08-24 (×3): qty 1
  Filled 2014-08-24: qty 2
  Filled 2014-08-24: qty 1

## 2014-08-24 MED ORDER — PERFLUTREN LIPID MICROSPHERE
1.0000 mL | INTRAVENOUS | Status: AC | PRN
  Administered 2014-08-24: 2 mL via INTRAVENOUS
  Filled 2014-08-24: qty 10

## 2014-08-24 MED ORDER — MAGNESIUM HYDROXIDE 400 MG/5ML PO SUSP
30.0000 mL | Freq: Every evening | ORAL | Status: DC | PRN
  Administered 2014-08-24: 30 mL via ORAL
  Filled 2014-08-24 (×2): qty 30

## 2014-08-24 MED ORDER — HEPARIN (PORCINE) IN NACL 100-0.45 UNIT/ML-% IJ SOLN
800.0000 [IU]/h | INTRAMUSCULAR | Status: DC
  Filled 2014-08-24: qty 250

## 2014-08-24 MED ORDER — FUROSEMIDE 10 MG/ML IJ SOLN
40.0000 mg | Freq: Once | INTRAMUSCULAR | Status: AC
  Administered 2014-08-24: 40 mg via INTRAVENOUS
  Filled 2014-08-24: qty 4

## 2014-08-24 MED ORDER — LISINOPRIL 2.5 MG PO TABS
2.5000 mg | ORAL_TABLET | Freq: Every day | ORAL | Status: DC
  Administered 2014-08-24 – 2014-08-27 (×4): 2.5 mg via ORAL
  Filled 2014-08-24 (×4): qty 1

## 2014-08-24 NOTE — Progress Notes (Signed)
Placed patient on CPAP of 6. At this time patient tolerating well. Rt will continue to monitor.

## 2014-08-24 NOTE — Progress Notes (Signed)
ANTICOAGULATION CONSULT NOTE - Follow Up Consult  Pharmacy Consult for heparin Indication: atrial fibrillation   Labs:  Recent Labs  08/23/14 1633 08/23/14 2240 08/24/14 0407 08/24/14 0615  HGB 11.6*  --  10.9*  --   HCT 36.8  --  35.0*  --   PLT 238  --  215  --   LABPROT 16.5*  --   --   --   INR 1.32  --   --   --   HEPARINUNFRC  --   --  1.44* 1.42*  CREATININE 1.15*  --  1.16*  --   TROPONINI <0.03 <0.03 <0.03 <0.03     Assessment: 79yo female supratherapeutic on heparin with initial dosing for Afib; repeated earlier lab as pt does not speak [place] and had multiple bandages on each arm so could not verify lab was drawn correctly, second attempt was verified by RN to have been drawn correctly.  Goal of Therapy:  Heparin level 0.3-0.7 units/ml   Plan:  Will hold heparin gtt x1hr then decrease rate to 800 units/hr and check level in [place], PharmD, BCPS  08/24/2014,7:33 AM

## 2014-08-24 NOTE — Progress Notes (Signed)
TELEMETRY: Reviewed telemetry pt in Atrial fibrillation rate 120s.Danley Danker Vitals:   08/24/14 0359 08/24/14 0500 08/24/14 0600 08/24/14 0727  BP: 122/68  118/38 142/66  Pulse: 67  76 94  Temp: 97.5 F (36.4 C)   97.4 F (36.3 C)  TempSrc: Oral   Oral  Resp: 18  20 23   Weight:  97.1 kg (214 lb 1.1 oz)    SpO2: 100%  99% 100%    Intake/Output Summary (Last 24 hours) at 08/24/14 0946 Last data filed at 08/24/14 0600  Gross per 24 hour  Intake 380.21 ml  Output   2175 ml  Net -1794.79 ml   Filed Weights   08/23/14 1833 08/24/14 0500  Weight: 96.616 kg (213 lb) 97.1 kg (214 lb 1.1 oz)    Subjective Patient does not speak [place]. No interpreter or family member available. Appears comfortable.   Marland Kitchen aspirin EC  81 mg Oral Daily  . insulin aspart  0-5 Units Subcutaneous QHS  . insulin aspart  0-9 Units Subcutaneous TID WC  . metoprolol tartrate  12.5 mg Oral BID   . sodium chloride 10 mL/hr at 08/23/14 2115  . diltiazem (CARDIZEM) infusion Stopped (08/24/14 0205)  . heparin      LABS: Basic Metabolic Panel:  Recent Labs  08/23/14 1633 08/24/14 0407  NA 138 140  K 4.2 4.1  CL 105 105  CO2 24 27  GLUCOSE 150* 134*  BUN 16 17  CREATININE 1.15* 1.16*  CALCIUM 8.8* 8.9  MG  --  2.1   Liver Function Tests:  Recent Labs  08/23/14 1633  AST 16  ALT 14  ALKPHOS 58  BILITOT 0.5  PROT 6.7  ALBUMIN 3.7   No results for input(s): LIPASE, AMYLASE in the last 72 hours. CBC:  Recent Labs  08/23/14 1633 08/24/14 0407  WBC 7.0 7.3  NEUTROABS 4.3  --   HGB 11.6* 10.9*  HCT 36.8 35.0*  MCV 87.0 87.1  PLT 238 215   Cardiac Enzymes:  Recent Labs  08/23/14 2240 08/24/14 0407 08/24/14 0615  TROPONINI <0.03 <0.03 <0.03   BNP: No results for input(s): PROBNP in the last 72 hours. D-Dimer: No results for input(s): DDIMER in the last 72 hours. Hemoglobin A1C: No results for input(s): HGBA1C in the last 72 hours. Fasting Lipid Panel:  Recent Labs  08/24/14 0407  CHOL 128  HDL 30*  LDLCALC 83  TRIG 77  CHOLHDL 4.3   Thyroid Function Tests:  Recent Labs  08/23/14 2240  TSH 2.057     Radiology/Studies:  Dg Chest Port 1 View  08/23/2014   CLINICAL DATA:  Dyspnea.  Tachycardia.  Congestive heart failure.  EXAM: PORTABLE CHEST - 1 VIEW  COMPARISON:  None.  FINDINGS: Large patient habitus noted. Moderate cardiomegaly demonstrated. Diffuse interstitial prominence is suspicious for mild interstitial edema. No evidence of pulmonary consolidation.  IMPRESSION: Cardiomegaly and diffuse interstitial prominence suspicious for mild edema/congestive heart failure. No focal consolidation.   Electronically Signed   By: Earle Gell M.D.   On: 08/23/2014 17:41    PHYSICAL EXAM General: Well developed, well nourished, in no acute distress. Head: Normocephalic, atraumatic, sclera non-icteric, oropharynx is clear Neck: Negative for carotid bruits. JVD not elevated. No adenopathy Lungs: Rales in bases.  Breathing is unlabored. Heart: IRRR S1 S2 with soft murmur. No  rubs, or gallops.  Abdomen: Soft, non-tender, non-distended with normoactive bowel sounds. No hepatomegaly. No rebound/guarding. No obvious abdominal masses. Msk:  Strength and tone appears  normal for age. Extremities: No clubbing, cyanosis or edema.  Distal pedal pulses are 2+ and equal bilaterally. Neuro: Alert and oriented X 3. Moves all extremities spontaneously. Psych:  Responds to questions appropriately with a normal affect.  ASSESSMENT AND PLAN: 1. Atrial fibrillation with RVR. Rate well controlled overnight on IV diltiazem. IV diltiazem turned off and rate creeping up. Will increase metoprolol to 50 mg bid. If rate still fast can resume IV diltiazem until oral meds adjusted. On IV heparin. Will need to transition to oral anticoagulation. If LV function normal will transition to Eliquis.  2. Acute CHF. EF is unknown. Awaiting Echo results. Will give Lasix 40 mg IV once. Good  diuresis with lasix yesterday.  3. CKD 4. DM on insulin  Present on Admission:  . Atrial fibrillation with RVR . HTN (hypertension)  Signed, Peter [place], [place] 08/24/2014 9:46 AM

## 2014-08-24 NOTE — Progress Notes (Signed)
  Echocardiogram 2D Echocardiogram with Definity has been performed.  Lisa [name] 08/24/2014, 11:19 AM

## 2014-08-24 NOTE — Progress Notes (Signed)
ANTICOAGULATION CONSULT NOTE - Follow Up Consult  Pharmacy Consult for heparin Indication: atrial fibrillation   Labs:  Recent Labs  08/23/14 1633 08/23/14 2240 08/24/14 0407 08/24/14 0615 08/24/14 1335  HGB 11.6*  --  10.9*  --   --   HCT 36.8  --  35.0*  --   --   PLT 238  --  215  --   --   LABPROT 16.5*  --   --   --   --   INR 1.32  --   --   --   --   HEPARINUNFRC  --   --  1.44* 1.42* 1.02*  CREATININE 1.15*  --  1.16*  --   --   TROPONINI <0.03 <0.03 <0.03 <0.03  --      Assessment: 79yo female supratherapeutic on heparin with initial dosing for Afib; repeated earlier lab as pt does not speak English  Heparin level still elevated on 800 units/hr. No bleeding noted. Will reduce rate.  Goal of Therapy:  Heparin level 0.3-0.7 units/ml   Plan:  Will hold heparin gtt x1hr then decrease rate to 500 units/hr and check level in 8hr.  Erin Hearing PharmD., BCPS Clinical Pharmacist Pager [phone] 08/24/2014 3:11 PM

## 2014-08-24 NOTE — Progress Notes (Signed)
Left lower extremity venous duplex completed.  Left:  No evidence of DVT, superficial thrombosis, or Baker's cyst.  Right:  Negative for DVT in the common femoral vein.  

## 2014-08-24 NOTE — Care Management Note (Signed)
Case Management Note  Patient Details  Name: Lisa [name] MRN: [id number] Date of Birth: September 01, 1935  Subjective/Objective:    Adm w at fib                Action/Plan: lives w husband, pcp Engineer, technical sales, has medicaid for meds   Expected Discharge Date:                  Expected Discharge Plan:  [place]  In-House Referral:     Discharge planning Services  CM Consult  Post Acute Care Choice:    Choice offered to:     DME Arranged:    DME Agency:     HH Arranged:    [place] Agency:     Status of Service:     Medicare Important Message Given:    Date Medicare IM Given:    Medicare IM give by:    Date Additional Medicare IM Given:    Additional Medicare Important Message give by:     If discussed at [place] of Stay Meetings, dates discussed:    Additional Comments: ur review done, will follow for dc needs as pt progresses  Lacretia Leigh, RN 08/24/2014, 9:11 AM

## 2014-08-24 NOTE — Progress Notes (Signed)
Upon review of telemetry over past 3-4 hrs, this RN questioned that there were some changes in the pt's QRS complex and was concerned about HR increased to 110-120s at rest and as high as 150-160s with activities such as turning and being placed on bedpan. Pt with no complaints of chest pain nor appears to be experiencing increased shortness of breath. MD on call notified and EKG performed per his order. While performing EKG, it was noticed that telemetry electrodes with poor placement and so those were changed. EKG added to paper chart and MD on call to review. At this time HR is WNL and pt continues to be in no apparent distress. Will continue to monitor patient's condition.

## 2014-08-25 DIAGNOSIS — I5031 Acute diastolic (congestive) heart failure: Secondary | ICD-10-CM | POA: Diagnosis present

## 2014-08-25 LAB — CBC
HCT: 35.5 % — ABNORMAL LOW (ref 36.0–46.0)
Hemoglobin: 11 g/dL — ABNORMAL LOW (ref 12.0–15.0)
MCH: 27.1 pg (ref 26.0–34.0)
MCHC: 31 g/dL (ref 30.0–36.0)
MCV: 87.4 fL (ref 78.0–100.0)
PLATELETS: 227 10*3/uL (ref 150–400)
RBC: 4.06 MIL/uL (ref 3.87–5.11)
RDW: 14 % (ref 11.5–15.5)
WBC: 6.3 10*3/uL (ref 4.0–10.5)

## 2014-08-25 LAB — BASIC METABOLIC PANEL
Anion gap: 8 (ref 5–15)
BUN: 24 mg/dL — ABNORMAL HIGH (ref 6–20)
CALCIUM: 9.2 mg/dL (ref 8.9–10.3)
CHLORIDE: 102 mmol/L (ref 101–111)
CO2: 31 mmol/L (ref 22–32)
Creatinine, Ser: 1.09 mg/dL — ABNORMAL HIGH (ref 0.44–1.00)
GFR calc Af Amer: 56 mL/min — ABNORMAL LOW (ref >60)
GFR calc non Af Amer: 48 mL/min — ABNORMAL LOW (ref >60)
GLUCOSE: 204 mg/dL — AB (ref 65–99)
POTASSIUM: 4.3 mmol/L (ref 3.5–5.1)
Sodium: 141 mmol/L (ref 135–145)

## 2014-08-25 LAB — HEMOGLOBIN A1C
Hgb A1c MFr Bld: 8.8 % — ABNORMAL HIGH (ref 4.8–5.6)
Mean Plasma Glucose: 206 mg/dL

## 2014-08-25 LAB — GLUCOSE, CAPILLARY
GLUCOSE-CAPILLARY: 320 mg/dL — AB (ref 65–99)
Glucose-Capillary: 192 mg/dL — ABNORMAL HIGH (ref 65–99)
Glucose-Capillary: 279 mg/dL — ABNORMAL HIGH (ref 65–99)

## 2014-08-25 LAB — HEPARIN LEVEL (UNFRACTIONATED)
HEPARIN UNFRACTIONATED: 0.51 [IU]/mL (ref 0.30–0.70)
HEPARIN UNFRACTIONATED: 0.46 [IU]/mL (ref 0.30–0.70)

## 2014-08-25 MED ORDER — INSULIN GLARGINE 100 UNIT/ML ~~LOC~~ SOLN
20.0000 [IU] | Freq: Every day | SUBCUTANEOUS | Status: DC
  Administered 2014-08-25 – 2014-08-27 (×3): 20 [IU] via SUBCUTANEOUS
  Filled 2014-08-25 (×4): qty 0.2

## 2014-08-25 MED ORDER — RIVAROXABAN 15 MG PO TABS
15.0000 mg | ORAL_TABLET | Freq: Every day | ORAL | Status: DC
  Administered 2014-08-25: 15 mg via ORAL
  Filled 2014-08-25: qty 1

## 2014-08-25 MED ORDER — FENOFIBRIC ACID 135 MG PO CPDR: 135.0000 mg | DELAYED_RELEASE_CAPSULE | Freq: Every day | ORAL | Status: DC

## 2014-08-25 MED ORDER — METOPROLOL TARTRATE 25 MG PO TABS
75.0000 mg | ORAL_TABLET | Freq: Two times a day (BID) | ORAL | Status: DC
  Administered 2014-08-25 (×2): 75 mg via ORAL
  Filled 2014-08-25 (×6): qty 1

## 2014-08-25 MED ORDER — SENNOSIDES-DOCUSATE SODIUM 8.6-50 MG PO TABS
1.0000 | ORAL_TABLET | Freq: Every day | ORAL | Status: DC | PRN
  Administered 2014-08-26 – 2014-08-27 (×2): 1 via ORAL
  Filled 2014-08-25 (×2): qty 1

## 2014-08-25 MED ORDER — ROSUVASTATIN CALCIUM 10 MG PO TABS
20.0000 mg | ORAL_TABLET | Freq: Every day | ORAL | Status: DC
  Administered 2014-08-25 – 2014-08-28 (×4): 20 mg via ORAL
  Filled 2014-08-25: qty 1
  Filled 2014-08-25 (×3): qty 2

## 2014-08-25 MED ORDER — FUROSEMIDE 20 MG PO TABS
20.0000 mg | ORAL_TABLET | Freq: Every day | ORAL | Status: DC
  Administered 2014-08-25 – 2014-08-28 (×4): 20 mg via ORAL
  Filled 2014-08-25 (×4): qty 1

## 2014-08-25 NOTE — Progress Notes (Signed)
Patient: Lisa [name] / Admit Date: 08/23/2014 / Date of Encounter: 08/25/2014, 7:59 AM   Subjective: Nurse says that she was able to communicate with patient yesterday using [place] translator on the phone. Apparently there was a difference in religious views with the [place]. We tried to get the [place] translator on the phone but unfortunately there are no [place] speakers available right now. We called her son who helped to translate over speaker phone. The patient denies any CP or SOB. She says she feels fine except needs something to help her have a stool.   Objective: Telemetry: atrial fib with variable rates, 90s-150s Physical Exam: Blood pressure 160/120, pulse 120, temperature 97.3 F (36.3 C), temperature source Oral, resp. rate 23, height 5\' 2"  (1.575 m), weight 211 lb 6.7 oz (95.9 kg), SpO2 94 %. Body mass index is 38.66 kg/(m^2). General: Well developed obese F in no acute distress. Head: Normocephalic, atraumatic, sclera non-icteric, no xanthomas, nares are without discharge. Neck: JVP not elevated. Lungs: Mild crackles at bases. No wheezes or rhonchi. Breathing is unlabored. Heart: Irregularly irregular, tachycardic at times, S1 S2 with soft SEM at RUSB. Abdomen: Soft, non-tender, non-distended with normoactive bowel sounds. No rebound/guarding. Extremities: No clubbing or cyanosis. No edema. Distal pedal pulses are 2+ and equal bilaterally. Neuro: Alert and oriented X 3. Moves all extremities spontaneously. Psych:  Responds to questions appropriately with a normal affect.   Intake/Output Summary (Last 24 hours) at 08/25/14 0759 Last data filed at 08/25/14 0700  Gross per 24 hour  Intake   1344 ml  Output   1850 ml  Net   -506 ml    Inpatient Medications:  . aspirin EC  81 mg Oral Daily  . insulin aspart  0-5 Units Subcutaneous QHS  . insulin aspart  0-9 Units Subcutaneous TID WC  . lisinopril  2.5 mg Oral Daily  . metoprolol tartrate  50 mg Oral  BID   Infusions:  . sodium chloride 10 mL/hr at 08/23/14 2115  . diltiazem (CARDIZEM) infusion Stopped (08/24/14 0205)  . heparin 500 Units/hr (08/25/14 0400)    Labs:  Recent Labs  08/24/14 0407 08/25/14 0400  NA 140 141  K 4.1 4.3  CL 105 102  CO2 27 31  GLUCOSE 134* 204*  BUN 17 24*  CREATININE 1.16* 1.09*  CALCIUM 8.9 9.2  MG 2.1  --     Recent Labs  08/23/14 1633  AST 16  ALT 14  ALKPHOS 58  BILITOT 0.5  PROT 6.7  ALBUMIN 3.7    Recent Labs  08/23/14 1633 08/24/14 0407 08/25/14 0400  WBC 7.0 7.3 6.3  NEUTROABS 4.3  --   --   HGB 11.6* 10.9* 11.0*  HCT 36.8 35.0* 35.5*  MCV 87.0 87.1 87.4  PLT 238 215 227    Recent Labs  08/23/14 1633 08/23/14 2240 08/24/14 0407 08/24/14 0615  TROPONINI <0.03 <0.03 <0.03 <0.03   Invalid input(s): POCBNP  Recent Labs  08/23/14 2240  HGBA1C 8.8*     Radiology/Studies:  Dg Chest Port 1 View  08/23/2014   CLINICAL DATA:  Dyspnea.  Tachycardia.  Congestive heart failure.  EXAM: PORTABLE CHEST - 1 VIEW  COMPARISON:  None.  FINDINGS: Large patient habitus noted. Moderate cardiomegaly demonstrated. Diffuse interstitial prominence is suspicious for mild interstitial edema. No evidence of pulmonary consolidation.  IMPRESSION: Cardiomegaly and diffuse interstitial prominence suspicious for mild edema/congestive heart failure. No focal consolidation.   Electronically Signed   By: Sharrie Rothman.D.  On: 08/23/2014 17:41     Assessment and Plan   1. Newly recognized atrial fib of unknown duration - rates still variable - overall trend is not bad (90s) but she still has spikes to the 120s-150s. She is asymptomatic with this. Will increase metoprolol to 75mg  BID. If HR remains elevated would further increase to 100mg  BID. Will place consult to transition from heparin to Xarelto per pharmacy.  2. Acute diastolic CHF - EF [id number] with no prior to compare to but Dr. [place] actually feels the EF is normal by images. He  reviewed with Dr. Meda Coffee who agrees. She diuresed well with IV Lasix. Will resume oral home Lasix today.  3. Bicuspid aortic valve with mild AS and moderately dilated ascending aorta - will need to follow clinically as outpatient to determine further workup for the dilated aorta.   4. HTN - follow BP with med changes.  5. Diabetes mellitus - uncontrolled A1C 8.8 - she was reportedly on Actos at home which may need to d/c permanently given CHF. She was reportedly on Lantus 50 units QHS. CBGs here are quite labile, 80s-260s. Will ask pharmacy to clarify the insulin dose before we re-order. She is on SSI for now.  6. Hyperlipidemia - resume home regimen.  7. Abnormal free T4 - question lab error versus subclinical hyperthyroidism. Will repeat free T4 and T3 in AM.  8. Constipation - rx PRN laxative.  Signed, Melina Copa PA-C Pager: [phone]  Patient seen and examined and history reviewed. Agree with above findings and plan. I did review Echo findings with Dr. Meda Coffee and we agree that LV function is normal. Study limited by increased HR and poor apical images. Overall HR control improved but HR jumps quickly with activity. Will push dose of metoprolol. Transition to oral anticoagulant today. Plan anticoagulation for 4 weeks then consider elective DCCV. She still has some rales on exam- will start po lasix today. May transfer to telemetry today.  Jaymarie Yeakel [place], [place] 08/25/2014 8:49 AM

## 2014-08-25 NOTE — Progress Notes (Signed)
ANTICOAGULATION CONSULT NOTE - Follow Up Consult  Pharmacy Consult for Heparin  Indication: atrial fibrillation  No Known Allergies  Patient Measurements: Height: 5\' 2"  (157.5 cm) (estimate) Weight: 214 lb 1.1 oz (97.1 kg) IBW/kg (Calculated) : 50.1  Vital Signs: Temp: 98.8 F (37.1 C) (07/21 2130) Temp Source: Oral (07/21 2130) BP: 173/81 mmHg (07/21 2319) Pulse Rate: 104 (07/21 2319)  Labs:  Recent Labs  08/23/14 1633 08/23/14 2240  08/24/14 0407 08/24/14 0615 08/24/14 1335 08/24/14 2353  HGB 11.6*  --   --  10.9*  --   --   --   HCT 36.8  --   --  35.0*  --   --   --   PLT 238  --   --  215  --   --   --   LABPROT 16.5*  --   --   --   --   --   --   INR 1.32  --   --   --   --   --   --   HEPARINUNFRC  --   --   < > 1.44* 1.42* 1.02* 0.51  CREATININE 1.15*  --   --  1.16*  --   --   --   TROPONINI <0.03 <0.03  --  <0.03 <0.03  --   --   < > = values in this interval not displayed.  Estimated Creatinine Clearance: 44.9 mL/min (by C-G formula based on Cr of 1.16).   Assessment: Therapeutic heparin level x 1 after rate multiple rate decreases  Goal of Therapy:  Heparin level 0.3-0.7 units/ml Monitor platelets by anticoagulation protocol: Yes   Plan:  -Continue heparin at 500 units/hr -Confirmatory HL with AM labs -Daily CBC/HL -Monitor for bleeding  Narda Bonds 08/25/2014,12:51 AM

## 2014-08-25 NOTE — Progress Notes (Signed)
Patient's home dose of Lantus was clarified to be 40units QHS. Diabetes coordinator recommends starting 20 QHS. I agree. Will order. Montague Corella PA-C

## 2014-08-25 NOTE — Progress Notes (Signed)
ANTICOAGULATION CONSULT NOTE - Follow Up Consult  Pharmacy Consult for Heparin  Indication: atrial fibrillation  No Known Allergies  Patient Measurements: Height: 5\' 2"  (157.5 cm) (estimate) Weight: 211 lb 6.7 oz (95.9 kg) IBW/kg (Calculated) : 50.1  Vital Signs: Temp: 97.3 F (36.3 C) (07/22 0743) Temp Source: Oral (07/22 0743) BP: 160/120 mmHg (07/22 0743) Pulse Rate: 120 (07/22 0743)  Labs:  Recent Labs  08/23/14 1633 08/23/14 2240  08/24/14 0407 08/24/14 0615 08/24/14 1335 08/24/14 2353 08/25/14 0400  HGB 11.6*  --   --  10.9*  --   --   --  11.0*  HCT 36.8  --   --  35.0*  --   --   --  35.5*  PLT 238  --   --  215  --   --   --  227  LABPROT 16.5*  --   --   --   --   --   --   --   INR 1.32  --   --   --   --   --   --   --   HEPARINUNFRC  --   --   < > 1.44* 1.42* 1.02* 0.51 0.46  CREATININE 1.15*  --   --  1.16*  --   --   --  1.09*  TROPONINI <0.03 <0.03  --  <0.03 <0.03  --   --   --   < > = values in this interval not displayed.  Estimated Creatinine Clearance: 47.4 mL/min (by C-G formula based on Cr of 1.09).   Assessment:  79 year old female admitted 08/23/2014 from PCP found to be in afib with RVR at 165. Given adenosine 6 mg then 12 mg by EMS without change.   PMH heart murmur, HTN, and diabetes  Pharmacy consulted to dose heparin.   Anticoagulation: Dopplers negative for dvt, heparin gtt for new onset AFib H/H, plt stable. HL 0.46 (goal 0.3-0.7) and therapeutic on heparin 500 units/hr  Goal of Therapy:  Heparin level 0.3-0.7 units/ml Monitor platelets by anticoagulation protocol: Yes   Plan:  -Continue heparin at 500 units/hr -Daily CBC/HL -Monitor for bleeding  Wynelle Fanny 08/25/2014,10:15 AM  Addendum: Transitioning to xarelto for afib. CrCl is <77mL/hr.   Plan: - Start xarelto 15mg  PO daily with supper - DC heparin gtt at the time of heparin discontinuation - Monitor renal fxn, S&S of bleeding  Salome Arnt, PharmD,  BCPS Pager # [phone] 08/25/2014 12:54 PM

## 2014-08-25 NOTE — Progress Notes (Addendum)
Inpatient Diabetes Program Recommendations  AACE/ADA: New Consensus Statement on Inpatient Glycemic Control (2013)  Target Ranges:  Prepandial:   less than 140 mg/dL      Peak postprandial:   less than 180 mg/dL (1-2 hours)      Critically ill patients:  140 - 180 mg/dL   Results for JOVONA, NEAL (MRN [id number]) as of 08/25/2014 11:21  Ref. Range 08/24/2014 07:27 08/24/2014 11:33 08/24/2014 16:10 08/24/2014 21:54  Glucose-Capillary Latest Ref Range: 65-99 mg/dL 89 190 (H) 267 (H) 173 (H)   Results for HASSET, STONIS (MRN [id number]) as of 08/25/2014 11:21  Ref. Range 08/25/2014 04:00  Glucose Latest Ref Range: 65-99 mg/dL 204 (H)    Admit with: A Fib w/ RVR  History: DM, CHF, HTN, CKD  Home DM Meds: Lantus 50 units QHS       Actos 45 mg daily  Current DM Orders: Novolog Sensitive SSI (0-9 units) TID AC + HS    -Note per MD notes that patient will likely need to stop taking Actos at time of discharge given CHF issues.  Agree.  -Note also that patient takes Lantus at home.  Pharmacy to clarify home dose.    MD- If you plan to restart Lantus, would not restart at home dose just yet.    Could start with weight based dose and titrate as needed to achieve desired fasting glucose level (80-130 mg/dl).  Recommend Lantus 20 units QHS (0.2 units/kg dosing based on weight of 96 kg)    Will follow Wyn Quaker RN, MSN, CDE Diabetes Coordinator Inpatient Glycemic Control Team Team Pager: [phone] (8a-5p)

## 2014-08-26 LAB — CBC
HCT: 37.2 % (ref 36.0–46.0)
Hemoglobin: 11.8 g/dL — ABNORMAL LOW (ref 12.0–15.0)
MCH: 27.4 pg (ref 26.0–34.0)
MCHC: 31.7 g/dL (ref 30.0–36.0)
MCV: 86.5 fL (ref 78.0–100.0)
Platelets: 233 10*3/uL (ref 150–400)
RBC: 4.3 MIL/uL (ref 3.87–5.11)
RDW: 13.9 % (ref 11.5–15.5)
WBC: 7.1 10*3/uL (ref 4.0–10.5)

## 2014-08-26 LAB — GLUCOSE, CAPILLARY
Glucose-Capillary: 228 mg/dL — ABNORMAL HIGH (ref 65–99)
Glucose-Capillary: 210 mg/dL — ABNORMAL HIGH (ref 65–99)
Glucose-Capillary: 239 mg/dL — ABNORMAL HIGH (ref 65–99)
Glucose-Capillary: 237 mg/dL — ABNORMAL HIGH (ref 65–99)

## 2014-08-26 LAB — BASIC METABOLIC PANEL
ANION GAP: 7 (ref 5–15)
BUN: 26 mg/dL — ABNORMAL HIGH (ref 6–20)
CALCIUM: 9.4 mg/dL (ref 8.9–10.3)
CHLORIDE: 102 mmol/L (ref 101–111)
CO2: 28 mmol/L (ref 22–32)
Creatinine, Ser: 1.05 mg/dL — ABNORMAL HIGH (ref 0.44–1.00)
GFR calc Af Amer: 58 mL/min — ABNORMAL LOW (ref >60)
GFR calc non Af Amer: 50 mL/min — ABNORMAL LOW (ref >60)
GLUCOSE: 246 mg/dL — AB (ref 65–99)
POTASSIUM: 4.2 mmol/L (ref 3.5–5.1)
SODIUM: 137 mmol/L (ref 135–145)

## 2014-08-26 LAB — T4, FREE: Free T4: 1.12 ng/dL (ref 0.61–1.12)

## 2014-08-26 MED ORDER — METOPROLOL TARTRATE 50 MG PO TABS: 50.0000 mg | ORAL_TABLET | Freq: Three times a day (TID) | ORAL | Status: DC

## 2014-08-26 MED ORDER — DIGOXIN 0.25 MG/ML IJ SOLN
0.2500 mg | Freq: Four times a day (QID) | INTRAMUSCULAR | Status: AC
  Administered 2014-08-26 (×2): 0.25 mg via INTRAVENOUS
  Filled 2014-08-26 (×3): qty 1

## 2014-08-26 MED ORDER — RIVAROXABAN 20 MG PO TABS
20.0000 mg | ORAL_TABLET | Freq: Every day | ORAL | Status: DC
  Administered 2014-08-26 – 2014-08-27 (×2): 20 mg via ORAL
  Filled 2014-08-26 (×2): qty 1

## 2014-08-26 MED ORDER — METOPROLOL TARTRATE 50 MG PO TABS
50.0000 mg | ORAL_TABLET | Freq: Three times a day (TID) | ORAL | Status: DC
  Administered 2014-08-26 – 2014-08-27 (×6): 50 mg via ORAL
  Filled 2014-08-26 (×5): qty 1

## 2014-08-26 NOTE — Progress Notes (Signed)
Subjective:  History was obtained through the translator by telephone.  She complains of constipation and some mild fullness in her epigastric area.  No shortness of breath at rest but does get tired with activity.  Continues with atrial fibrillation and is rapid at times.  Blood pressure mildly elevated.  Objective:  Vital Signs in the last 24 hours: BP 155/68 mmHg  Pulse 82  Temp(Src) 97.8 F (36.6 C) (Oral)  Resp 21  Ht 5\' 2"  (1.575 m)  Wt 96.389 kg (212 lb 8 oz)  BMI 38.86 kg/m2  SpO2 97%  Physical Exam: Obese [place] woman currently in no acute distress Lungs:  Clear  Cardiac:  Irregular rhythm, normal S1 and S2, no S3 Abdomen:  Soft, nontender, no masses, quite obese Extremities:  No edema present  Intake/Output from previous day: 07/22 0701 - 07/23 0700 In: 280 [P.O.:220; I.V.:60] Out: 450 [Urine:450] Weight Filed Weights   08/25/14 0600 08/25/14 1211 08/26/14 0601  Weight: 95.9 kg (211 lb 6.7 oz) 95.9 kg (211 lb 6.7 oz) 96.389 kg (212 lb 8 oz)    Lab Results: Basic Metabolic Panel:  Recent Labs  08/25/14 0400 08/26/14 0444  NA 141 137  K 4.3 4.2  CL 102 102  CO2 31 28  GLUCOSE 204* 246*  BUN 24* 26*  CREATININE 1.09* 1.05*    CBC:  Recent Labs  08/23/14 1633  08/25/14 0400 08/26/14 0444  WBC 7.0  < > 6.3 7.1  NEUTROABS 4.3  --   --   --   HGB 11.6*  < > 11.0* 11.8*  HCT 36.8  < > 35.5* 37.2  MCV 87.0  < > 87.4 86.5  PLT 238  < > 227 233  < > = values in this interval not displayed.  BNP    Component Value Date/Time   BNP 234.7* 08/23/2014 1633    PROTIME: Lab Results  Component Value Date   INR 1.32 08/23/2014    Telemetry: Atrial fibrillation with variable response with some rates going up as high as 150  Assessment/Plan:  1.  Persistent atrial fibrillation with variable response 2.  Long-term anticoagulation  3.  Hypertension  Recommendations:  Add Lanoxin to try to smooth out the rate-related variabilities with her  atrial fibrillation.  Discussed with patient through the translator and son separately over his phone that she'll need to be anticoagulated and then have cardioversion down the road.  Hopefully discharge Monday.      Kerry Hough  MD [facility] Card[facility]  08/26/2014, 9:24 AM

## 2014-08-26 NOTE — Progress Notes (Signed)
Pharmacy note: Xarelto  79 yo female with afib on xarelto. SCr= 1.05 and CrCl ~ 70 (using total body weight). Plans noted for cardioversion at a later time.  Plan -Change xarelto to 20mg  daily (due > 50 by TBW)  Hildred Laser, Pharm D 08/26/2014 12:53 PM

## 2014-08-27 LAB — GLUCOSE, CAPILLARY
GLUCOSE-CAPILLARY: 178 mg/dL — AB (ref 65–99)
GLUCOSE-CAPILLARY: 208 mg/dL — AB (ref 65–99)
GLUCOSE-CAPILLARY: 271 mg/dL — AB (ref 65–99)
Glucose-Capillary: 133 mg/dL — ABNORMAL HIGH (ref 65–99)

## 2014-08-27 LAB — CBC
HCT: 37.6 % (ref 36.0–46.0)
Hemoglobin: 12 g/dL (ref 12.0–15.0)
MCH: 27.2 pg (ref 26.0–34.0)
MCHC: 31.9 g/dL (ref 30.0–36.0)
MCV: 85.3 fL (ref 78.0–100.0)
Platelets: 217 10*3/uL (ref 150–400)
RBC: 4.41 MIL/uL (ref 3.87–5.11)
RDW: 13.6 % (ref 11.5–15.5)
WBC: 7.1 10*3/uL (ref 4.0–10.5)

## 2014-08-27 LAB — BASIC METABOLIC PANEL
Anion gap: 9 (ref 5–15)
BUN: 22 mg/dL — ABNORMAL HIGH (ref 6–20)
CALCIUM: 9.3 mg/dL (ref 8.9–10.3)
CO2: 28 mmol/L (ref 22–32)
Chloride: 101 mmol/L (ref 101–111)
Creatinine, Ser: 0.97 mg/dL (ref 0.44–1.00)
GFR calc Af Amer: >60 mL/min (ref >60)
GFR, EST NON AFRICAN AMERICAN: 55 mL/min — AB (ref >60)
Glucose, Bld: 154 mg/dL — ABNORMAL HIGH (ref 65–99)
Potassium: 3.6 mmol/L (ref 3.5–5.1)
SODIUM: 138 mmol/L (ref 135–145)

## 2014-08-27 MED ORDER — DIGOXIN 125 MCG PO TABS
0.1250 mg | ORAL_TABLET | Freq: Every day | ORAL | Status: DC
  Administered 2014-08-27 – 2014-08-28 (×2): 0.125 mg via ORAL
  Filled 2014-08-27 (×2): qty 1

## 2014-08-27 MED ORDER — LISINOPRIL 10 MG PO TABS
10.0000 mg | ORAL_TABLET | Freq: Every day | ORAL | Status: DC
  Administered 2014-08-27: 10 mg via ORAL
  Filled 2014-08-27: qty 1

## 2014-08-27 MED ORDER — LISINOPRIL 20 MG PO TABS
20.0000 mg | ORAL_TABLET | Freq: Every day | ORAL | Status: DC
  Administered 2014-08-28: 20 mg via ORAL
  Filled 2014-08-27: qty 1

## 2014-08-27 MED ORDER — PANTOPRAZOLE SODIUM 40 MG PO TBEC
40.0000 mg | DELAYED_RELEASE_TABLET | Freq: Every day | ORAL | Status: DC
  Administered 2014-08-27: 40 mg via ORAL
  Filled 2014-08-27: qty 1

## 2014-08-27 MED ORDER — LISINOPRIL 10 MG PO TABS
10.0000 mg | ORAL_TABLET | ORAL | Status: AC
  Administered 2014-08-27: 10 mg via ORAL
  Filled 2014-08-27: qty 1

## 2014-08-27 NOTE — Progress Notes (Signed)
Subjective:  History was obtained through the translator by telephone.  He had mild epigastric discomfort last night but better today.  Breathing is better.  Her atrial fibrillation rate with the addition of digoxin is better controlled today.    Objective:  Vital Signs in the last 24 hours: BP 178/88 mmHg  Pulse 88  Temp(Src) 98.5 F (36.9 C) (Oral)  Resp 18  Ht 5\' 2"  (1.575 m)  Wt 92.897 kg (204 lb 12.8 oz)  BMI 37.45 kg/m2  SpO2 92%  Physical Exam: Obese [place] woman currently in no acute distress Lungs:  Clear  Cardiac:  Irregular rhythm, normal S1 and S2, no S3, 1/6 systolic murmur Abdomen:  Soft, nontender, no masses, quite obese Extremities:  No edema present  Intake/Output from previous day:   Weight Filed Weights   08/25/14 1211 08/26/14 0601 08/27/14 0458  Weight: 95.9 kg (211 lb 6.7 oz) 96.389 kg (212 lb 8 oz) 92.897 kg (204 lb 12.8 oz)    Lab Results: Basic Metabolic Panel:  Recent Labs  08/26/14 0444 08/27/14 0420  NA 137 138  K 4.2 3.6  CL 102 101  CO2 28 28  GLUCOSE 246* 154*  BUN 26* 22*  CREATININE 1.05* 0.97    CBC:  Recent Labs  08/26/14 0444 08/27/14 0420  WBC 7.1 7.1  HGB 11.8* 12.0  HCT 37.2 37.6  MCV 86.5 85.3  PLT 233 217    BNP    Component Value Date/Time   BNP 234.7* 08/23/2014 1633    PROTIME: Lab Results  Component Value Date   INR 1.32 08/23/2014    Telemetry: Atrial fibrillation with variable response with some rates going up as high as 150  Assessment/Plan:  1.  Persistent atrial fibrillation with variable response 2.  Long-term anticoagulation  3.  Hypertension 4.  Combined systolic and diastolic heart failure with weight down 7 pounds  Recommendations:  Discussed with patient with translator over phone.  Plan discharge tomorrow if stable.  Atrial fibrillation rate is better control.  Add low-dose digoxin orally to help with rapid response.  Increase lisinopril dose because of blood pressure.  Add  proton pump inhibitor because of abdominal pain.   Kerry Hough  MD [facility]  08/27/2014, 9:08 AM

## 2014-08-27 NOTE — Progress Notes (Signed)
HR 58-60s on tele, still in A-fib.  0200 dose of digoxin not given d/t low HR.  Jodell Cipro

## 2014-08-28 ENCOUNTER — Telehealth: Payer: Self-pay | Admitting: Nurse Practitioner

## 2014-08-28 LAB — CBC
HCT: 40.4 % (ref 36.0–46.0)
Hemoglobin: 13.2 g/dL (ref 12.0–15.0)
MCH: 27.7 pg (ref 26.0–34.0)
MCHC: 32.7 g/dL (ref 30.0–36.0)
MCV: 84.7 fL (ref 78.0–100.0)
PLATELETS: 265 10*3/uL (ref 150–400)
RBC: 4.77 MIL/uL (ref 3.87–5.11)
RDW: 13.6 % (ref 11.5–15.5)
WBC: 9.1 10*3/uL (ref 4.0–10.5)

## 2014-08-28 LAB — T3: T3 TOTAL: 124 ng/dL (ref 71–180)

## 2014-08-28 LAB — GLUCOSE, CAPILLARY
GLUCOSE-CAPILLARY: 210 mg/dL — AB (ref 65–99)
Glucose-Capillary: 176 mg/dL — ABNORMAL HIGH (ref 65–99)

## 2014-08-28 MED ORDER — SENNOSIDES-DOCUSATE SODIUM 8.6-50 MG PO TABS: 1.0000 | ORAL_TABLET | Freq: Every day | ORAL | Status: DC | PRN

## 2014-08-28 MED ORDER — METOPROLOL TARTRATE 50 MG PO TABS
75.0000 mg | ORAL_TABLET | Freq: Two times a day (BID) | ORAL | Status: DC
  Administered 2014-08-28: 75 mg via ORAL
  Filled 2014-08-28 (×2): qty 1

## 2014-08-28 MED ORDER — LANTUS 100 UNIT/ML ~~LOC~~ SOLN: 20.0000 [IU] | Freq: Every day | SUBCUTANEOUS | Status: DC

## 2014-08-28 MED ORDER — DOCUSATE SODIUM 100 MG PO CAPS
100.0000 mg | ORAL_CAPSULE | Freq: Once | ORAL | Status: AC
  Administered 2014-08-28: 100 mg via ORAL
  Filled 2014-08-28: qty 1

## 2014-08-28 MED ORDER — FUROSEMIDE 20 MG PO TABS
20.0000 mg | ORAL_TABLET | Freq: Every day | ORAL | Status: DC
Start: 2014-08-28 — End: 2015-08-06

## 2014-08-28 MED ORDER — LISINOPRIL 20 MG PO TABS: 20.0000 mg | ORAL_TABLET | Freq: Every day | ORAL | Status: DC

## 2014-08-28 MED ORDER — DIGOXIN 125 MCG PO TABS: 0.1250 mg | ORAL_TABLET | Freq: Every day | ORAL | Status: DC

## 2014-08-28 MED ORDER — METOPROLOL TARTRATE 75 MG PO TABS: 75.0000 mg | ORAL_TABLET | Freq: Two times a day (BID) | ORAL | Status: DC

## 2014-08-28 MED ORDER — RIVAROXABAN 20 MG PO TABS: 20.0000 mg | ORAL_TABLET | Freq: Every day | ORAL | Status: DC

## 2014-08-28 NOTE — Telephone Encounter (Addendum)
Patient contacted regarding discharge from [facility] on 08/28/2014.  Patient understands to follow up with provider Tarri Fuller on 08/08 at 09:30 at [facility]. Patient understands discharge instructions? Yes Patient understands medications and regiment? Yes Patient understands to bring all medications to this visit? Yes  Pt phone is disconnected, spoke with pt Grand daughter who stated pt is still at the hospital and will be discharge today.  Spoke with pt daughter, Who stated her mom is doing great.

## 2014-08-28 NOTE — Discharge Summary (Signed)
Physician Discharge Summary     Cardiologist:  [place]  Patient ID: Lisa [name] MRN: [id number] DOB/AGE: 02/14/1935 79 y.o.  Admit date: 08/23/2014 Discharge date: 08/28/2014  Admission Diagnoses:  Afib RVR  Discharge Diagnoses:  Principal Problem:   Atrial fibrillation with RVR Active Problems:   HTN (hypertension)   DM type 2 (diabetes mellitus, type 2)   Acute diastolic CHF (congestive heart failure)   Right knee pain  Discharged Condition: stable  Hospital Course:   79 year old female with hx heart murmur, OSA with CPAP that she uses at times, +HTN and diabetes, from [place] speaks [place] as does her husband.  She presented from PCP.  She was there for Echo for DOE at [facility] and found to be in a fib with RVR at 165. Was given adenosine 6 mg then 12 mg by EMS without change. She was started on IV dilt HR has improved to 108. With interpretor, she stated she had significant fatigue about 10 days ago. No cardiac Hx. and no strokes. She does have some DOE. Troponin negative times three. EKG A fib with RVR and RBBB & LPFB.  She denied chest pain.  No awareness or racing HR. Last Nuc 2011 per their records, no results.   The patient was admitted and continued on IV dilt and started on IV heparin.  She was also volume overloaded an given IV lasix.  Her weight decreased by 12# and the dose was changed to 20mg  daily.  She ruled out for MI.  2D echo revealed and EF of 40-45% with no wall motion abnormalities, functional bicuspid AV with mild AS.  CHADSVASC score of 5 so Xarelto was started and heparin stopped.  IV diltiazem was stopped and metoprolol added.  This was increased to 75mg  BID.  Digoxin was also added and rate control was achieved.  She will be scheduled for follow up and considered for DCCV after 30 days of anticoagulation.  TSH, T#/4 WNL.  Lisinopril was also increased to 20mg  daily for better BP control.  She complained of right medial knee pain.  LE  venous dopplers were negative for DVT.  This is likely degenerative.  CBGs were labile.  Diabetes coordinator consulted and recommended decreasing lantus to 20mg .  Actos stopped due to CHF.  The patient was seen by Dr. Burt Knack who felt she was stable for DC home.   Consults: Diabetes [place]  Significant Diagnostic Studies:   Echo Study Conclusions  - Left ventricle: The cavity size was normal. There was moderate concentric hypertrophy. Systolic function was mildly to moderately reduced. The estimated ejection fraction was in the range of 40% to 45%. Wall motion was normal; there were no regional wall motion abnormalities. - Aortic valve: Functionally bicuspid; moderately thickened, moderately calcified leaflets. Valve mobility was restricted. There was mild stenosis. Mean gradient (S): 9 mm Hg. Peak gradient (S): 18 mm Hg. - Aortic root: The aortic root was normal in size. - Ascending aorta: The ascending aorta was moderately dilated measuring 44 mm. - Mitral valve: Calcified annulus. Mildly thickened leaflets . There was trivial regurgitation. - Left atrium: The atrium was mildly dilated. - Right ventricle: Systolic function was normal. - Right atrium: The atrium was normal in size. - Tricuspid valve: Structurally normal valve. There was mild-moderate regurgitation. - Pulmonary arteries: Systolic pressure was mildly increased. PA peak pressure: 39 mm Hg (S). - Inferior vena cava: The vessel was normal in size. - Pericardium, extracardiac: There was no pericardial effusion.  Impressions:  -  Mildly decreased LVEF, estimate can be affected by rapid atrial fibrillation. Functionally bicuspid aortic valve with mild aortic stenosis and moderately dilated ascending aorta measuring 44 mm.  PORTABLE CHEST - 1 VIEW  COMPARISON: None.  FINDINGS: Large patient habitus noted. Moderate cardiomegaly demonstrated. Diffuse interstitial prominence is  suspicious for mild interstitial edema. No evidence of pulmonary consolidation.  IMPRESSION: Cardiomegaly and diffuse interstitial prominence suspicious for mild edema/congestive heart failure. No focal consolidation.   Treatments:  See Above.   Discharge Exam: Blood pressure 148/82, pulse 61, temperature 98.3 F (36.8 C), temperature source Oral, resp. rate 18, height 5\' 2"  (1.575 m), weight 200 lb 4.8 oz (90.855 kg), SpO2 95 %.   Disposition: Final discharge disposition not confirmed      Discharge Instructions    Diet - low sodium heart healthy    Complete by:  As directed      Increase activity slowly    Complete by:  As directed             Medication List    STOP taking these medications        pioglitazone 45 MG tablet  Commonly known as:  ACTOS     valsartan 320 MG tablet  Commonly known as:  DIOVAN      TAKE these medications        CRESTOR 20 MG tablet  Generic drug:  rosuvastatin  Take 20 mg by mouth daily.     digoxin 0.125 MG tablet  Commonly known as:  LANOXIN  Take 1 tablet (0.125 mg total) by mouth daily.     Fenofibric Acid 135 MG Cpdr  Take 135 mg by mouth daily.     furosemide 20 MG tablet  Commonly known as:  LASIX  Take 20 mg by mouth daily.     LANTUS 100 UNIT/ML injection  Generic drug:  insulin glargine  Inject 0.2 mLs (20 Units total) into the skin at bedtime.     lisinopril 20 MG tablet  Commonly known as:  PRINIVIL,ZESTRIL  Take 1 tablet (20 mg total) by mouth daily.     Metoprolol Tartrate 75 MG Tabs  Take 75 mg by mouth 2 (two) times daily.     NAPROXEN DR 500 MG EC tablet  Generic drug:  naproxen  Take 500 mg by mouth daily.     rivaroxaban 20 MG Tabs tablet  Commonly known as:  XARELTO  Take 1 tablet (20 mg total) by mouth daily with supper.     Vitamin D (Ergocalciferol) 50000 UNITS Caps capsule  Commonly known as:  DRISDOL  Take 50,000 Units by mouth once a week.       Follow-up Information    Follow  up with Murray Hodgkins, NP On 09/07/2014.   Specialties:  Nurse Practitioner, Cardiology, Radiology   Why:  9:30 AM   Contact information:   [id number] [place] [address] [phone]      Greater than 30 minutes was spent completing the patient's discharge.   SignedTarri Fuller, [place] 08/28/2014, 9:04 AM

## 2014-08-28 NOTE — Telephone Encounter (Signed)
2wk TCM w/ Ignacia Bayley 8/4 @ 9:30 per Gaspar Bidding

## 2014-08-28 NOTE — Care Management Note (Addendum)
Case Management Note  Patient Details  Name: Lisa [name] MRN: [id number] Date of Birth: August 24, 1935  Subjective/Objective:     Pt admitted for Afib. Plan to return home with family support.               Action/Plan: CM did provide pt with 30 day free xarelto card. MD please write Rx for 30 day free no refills and the original Rx with refills. Pt should have co pay of $3.00 with Medicaid. CM did call [facility] and medication is available. No further needs from CM at this time.    Expected Discharge Date:                  Expected Discharge Plan:  Home/Self Care  In-House Referral:  NA  Discharge planning Services  CM Consult  Post Acute Care Choice:  NA Choice offered to:  NA  DME Arranged:  N/A DME Agency:  NA  HH Arranged:  NA HH Agency:  NA  Status of Service:  Completed, signed off  Medicare Important Message Given:    Date Medicare IM Given:    Medicare IM give by:    Date Additional Medicare IM Given:    Additional Medicare Important Message give by:     If discussed at [place] of Stay Meetings, dates discussed:    Additional Comments:  Bethena Roys, RN 08/28/2014, 10:20 AM

## 2014-08-28 NOTE — Progress Notes (Signed)
Subjective: Appears to still have some right   Objective: Vital signs in last 24 hours: Temp:  [98.3 F (36.8 C)-99 F (37.2 C)] 98.3 F (36.8 C) (07/25 0454) Pulse Rate:  [61-104] 61 (07/25 0454) Resp:  [18] 18 (07/24 1523) BP: (148-198)/(72-95) 148/82 mmHg (07/25 0454) SpO2:  [95 %-99 %] 95 % (07/25 0454) Weight:  [200 lb 4.8 oz (90.855 kg)] 200 lb 4.8 oz (90.855 kg) (07/25 0454) Last BM Date: 08/26/14  Intake/Output from previous day: 07/24 0701 - 07/25 0700 In: 300 [P.O.:300] Out: -  Intake/Output this shift:    Medications Scheduled Meds: . aspirin EC  81 mg Oral Daily  . digoxin  0.125 mg Oral Daily  . furosemide  20 mg Oral Daily  . insulin aspart  0-5 Units Subcutaneous QHS  . insulin aspart  0-9 Units Subcutaneous TID WC  . insulin glargine  20 Units Subcutaneous QHS  . lisinopril  20 mg Oral Daily  . metoprolol tartrate  50 mg Oral TID  . pantoprazole  40 mg Oral Q1200  . rivaroxaban  20 mg Oral Q supper  . rosuvastatin  20 mg Oral Daily   Continuous Infusions: . sodium chloride 10 mL/hr at 08/25/14 0800   PRN Meds:.acetaminophen, ALPRAZolam, magnesium hydroxide, ondansetron (ZOFRAN) IV, senna-docusate  PE: General appearance: alert, cooperative and no distress Lungs: clear to auscultation bilaterally Heart: irregularly irregular rhythm and 1/6 sys MM Extremities: No LEE Pulses: 2+ and symmetric Skin: Warm and dry Neurologic: Grossly normal  Lab Results:   Recent Labs  08/26/14 0444 08/27/14 0420 08/28/14 0307  WBC 7.1 7.1 9.1  HGB 11.8* 12.0 13.2  HCT 37.2 37.6 40.4  PLT 233 217 265   BMET  Recent Labs  08/26/14 0444 08/27/14 0420  NA 137 138  K 4.2 3.6  CL 102 101  CO2 28 28  GLUCOSE 246* 154*  BUN 26* 22*  CREATININE 1.05* 0.97  CALCIUM 9.4 9.3    Assessment/Plan  Principal Problem:   Atrial fibrillation with RVR Rate controlled on digoxin. Metoprolol 50 TID.  Also on Xarelto.  Consider DCCV in 30 days.   HTN  (hypertension) Lisinopril increased yesterday but she has not received the increased dose yet.  BP a little better this morning.  Follow up in office.      DM type 2 (diabetes mellitus, type 2)  SS and Lantus.  A1C 8.8.    Acute diastolic CHF (congestive heart failure) EF 40-45%, Wall motion normal. Wt down 12 pounds.  SCr stable.  Continue Lasix 20mg  daily.  Follow in office.     Right knee pain, medial  LE venous dopplers negative for DVT.  Foot warm.  Follow up with PCP if continues.    LOS: 5 days    HAGER, BRYAN PA-C 08/28/2014 7:46 AM  Patient seen, examined. Available data reviewed. Agree with findings, assessment, and plan as outlined by Tarri Fuller, PA-C. The patient is interviewed and examined with use of a [place] translator via telephone. The patient currently denies any cardiac symptoms. Her only complaint is medial right knee pain. The right knee is examined and there is no obvious swelling, deformity, or point tenderness. Cardiac exam shows the heart is irregularly irregular, but rate controlled, with a grade 2/6 systolic ejection murmur heard throughout. The lung fields are clear. There is no peripheral edema. I agree with the plans as outlined above. The patient will need close outpatient follow-up to ensure compliance with her medical program. Beta  blocker will be consolidated to twice daily dosing. She will continue on Xarelto for anticoagulation. Would be reasonable to consider elective cardioversion in 30 days if she is symptomatic. She appears compensated with respect to her congestive heart failure. Her weight is down 12 pounds and creatinine is stable as outlined above. I agree that she should be discharged on low-dose oral Lasix.  Sherren Mocha, M.D. 08/28/2014 8:35 AM

## 2014-08-30 NOTE — Telephone Encounter (Signed)
LMTC

## 2014-09-07 ENCOUNTER — Encounter: Payer: Self-pay | Admitting: Nurse Practitioner

## 2014-09-07 ENCOUNTER — Ambulatory Visit (INDEPENDENT_AMBULATORY_CARE_PROVIDER_SITE_OTHER): Payer: Medicare Other | Admitting: Nurse Practitioner

## 2014-09-07 ENCOUNTER — Telehealth: Payer: Self-pay | Admitting: *Deleted

## 2014-09-07 VITALS — BP 152/80 | HR 70 | Ht 62.0 in | Wt 202.0 lb

## 2014-09-07 DIAGNOSIS — Q230 Congenital stenosis of aortic valve: Secondary | ICD-10-CM | POA: Insufficient documentation

## 2014-09-07 DIAGNOSIS — I5042 Chronic combined systolic (congestive) and diastolic (congestive) heart failure: Secondary | ICD-10-CM | POA: Diagnosis not present

## 2014-09-07 DIAGNOSIS — I10 Essential (primary) hypertension: Secondary | ICD-10-CM | POA: Diagnosis not present

## 2014-09-07 DIAGNOSIS — I4819 Other persistent atrial fibrillation: Secondary | ICD-10-CM

## 2014-09-07 DIAGNOSIS — N183 Chronic kidney disease, stage 3 unspecified: Secondary | ICD-10-CM | POA: Insufficient documentation

## 2014-09-07 DIAGNOSIS — E119 Type 2 diabetes mellitus without complications: Secondary | ICD-10-CM

## 2014-09-07 DIAGNOSIS — N184 Chronic kidney disease, stage 4 (severe): Secondary | ICD-10-CM | POA: Insufficient documentation

## 2014-09-07 DIAGNOSIS — I4891 Unspecified atrial fibrillation: Secondary | ICD-10-CM

## 2014-09-07 DIAGNOSIS — G4733 Obstructive sleep apnea (adult) (pediatric): Secondary | ICD-10-CM | POA: Insufficient documentation

## 2014-09-07 DIAGNOSIS — I481 Persistent atrial fibrillation: Secondary | ICD-10-CM

## 2014-09-07 DIAGNOSIS — Z9989 Dependence on other enabling machines and devices: Secondary | ICD-10-CM

## 2014-09-07 DIAGNOSIS — I350 Nonrheumatic aortic (valve) stenosis: Secondary | ICD-10-CM | POA: Diagnosis not present

## 2014-09-07 DIAGNOSIS — I5043 Acute on chronic combined systolic (congestive) and diastolic (congestive) heart failure: Secondary | ICD-10-CM | POA: Insufficient documentation

## 2014-09-07 DIAGNOSIS — Q231 Congenital insufficiency of aortic valve: Secondary | ICD-10-CM

## 2014-09-07 MED ORDER — LANTUS 100 UNIT/ML ~~LOC~~ SOLN: 44.0000 [IU] | Freq: Every day | SUBCUTANEOUS | Status: DC

## 2014-09-07 MED ORDER — VALSARTAN 320 MG PO TABS: 320.0000 mg | ORAL_TABLET | Freq: Every day | ORAL | Status: DC

## 2014-09-07 NOTE — Progress Notes (Signed)
Patient Name: Lisa [name] Date of Encounter: 09/07/2014  Primary Care Provider:  [facility] Primary Cardiologist:  B. Stanford Breed, MD   Chief Complaint  79 year old female status post recent hospital physician for A. fib and heart failure who presents for follow-up.  Past Medical History   Past Medical History  Diagnosis Date  . Chronic combined systolic and diastolic CHF (congestive heart failure)     a. 08/2014 Echo: EF 40-45%, no rwma, bicuspid AoV, mild AS, mod dil Asc Ao, PASP 26mmHg.  Marland Kitchen Anemia   . Essential hypertension   . Diabetes mellitus without complication   . Vitamin D deficiency   . CKD (chronic kidney disease), stage III   . Carotid arterial disease   . Osteoarthritis of both ankles   . Mild aortic stenosis     a. 08/2014 Echo: mild AS, bicuspid AoV.  . OSA on CPAP   . Persistent atrial fibrillation     a. Dx 08/2013, CHA2DS2VASc=5-->Xarelto.   Past Surgical History  Procedure Laterality Date  . Cholecystectomy      Allergies  No Known Allergies  HPI  79 year old female with a prior history of hypertension, diabetes, stage III kidney disease, obesity, and sleep apnea. She had been experiencing progressive dyspnea on exertion and was sent for an echocardiogram and subsequent found to be in A. fib with RVR. She was admitted to [facility] where she was volume overloaded. She was diuresed and lost 12 pounds with clinical improvement. With regards to atrial fibrillation, she was placed on metoprolol and digoxin with reasonable rate control. Xarelto was added. Echocardiogram showed an EF of 40-45% and she was placed on beta blocker and ACE inhibitor therapy. Plan was for rate control and anticoagulation for 4 weeks followed by cardioversion.  Since discharge, she has done very well. Her weight has been stable and she has had no recurrence of dyspnea or edema. She denies chest pain or palpitations. She has been having a nagging cough which she attributes to  taking lisinopril. Notably, she had previously been on valsartan but this was apparently discontinued at discharge in favor of lisinopril. It is not clear if the inpatient team realized that she was previously on valsartan. She denies PND, orthopnea, dizziness, syncope, or early satiety. She has not been weighing herself daily.  Home Medications  Prior to Admission medications   Medication Sig Start Date End Date Taking? Authorizing Provider  B-D INS SYR ULTRAFINE .5CC/29G 29G X 1/2" 0.5 ML MISC USE UTD TO INJECT INSULIN TWICE DAILY 06/03/14  Yes Historical Provider, MD  Choline Fenofibrate (FENOFIBRIC ACID) 135 MG CPDR Take 135 mg by mouth daily. 08/08/14  Yes Historical Provider, MD  CRESTOR 20 MG tablet Take 20 mg by mouth daily. 06/03/14  Yes Historical Provider, MD  digoxin (LANOXIN) 0.125 MG tablet Take 1 tablet (0.125 mg total) by mouth daily. 08/28/14  Yes Brett Canales, PA-C  furosemide (LASIX) 20 MG tablet Take 1 tablet (20 mg total) by mouth daily. 08/28/14  Yes Brett Canales, PA-C  LANTUS 100 UNIT/ML injection Inject 0.2 mLs (20 Units total) into the skin at bedtime. 08/28/14  Yes Brett Canales, PA-C  lisinopril (PRINIVIL,ZESTRIL) 20 MG tablet Take 1 tablet (20 mg total) by mouth daily. 08/28/14  Yes Brett Canales, PA-C  metoprolol tartrate (LOPRESSOR) 25 MG tablet TK 3 TS PO BID 08/28/14  Yes Historical Provider, MD  NAPROXEN DR 500 MG EC tablet Take 500 mg by mouth daily. 06/21/14  Yes Historical  Provider, MD  rivaroxaban (XARELTO) 20 MG TABS tablet Take 1 tablet (20 mg total) by mouth daily with supper. 08/28/14  Yes Einar Pheasant Hager, PA-C  senna-docusate (SENOKOT-S) 8.6-50 MG per tablet Take 1 tablet by mouth daily as needed for mild constipation. 08/28/14  Yes Brett Canales, PA-C  Vitamin D, Ergocalciferol, (DRISDOL) 50000 UNITS CAPS capsule Take 50,000 Units by mouth once a week. 08/08/14  Yes Historical Provider, MD    Review of Systems  As above, patient has been doing well since discharge.  She denies chest pain, dyspnea, palpitations, PND, orthopnea, dizziness, syncope, edema.  All other systems reviewed and are otherwise negative except as noted above.  Physical Exam  VS:  BP 152/80 mmHg  Pulse 70  Ht 5\' 2"  (1.575 m)  Wt 202 lb (91.627 kg)  BMI 36.94 kg/m2 , BMI Body mass index is 36.94 kg/(m^2). GEN: Well nourished, well developed, in no acute distress. HEENT: normal. Neck: Supple,  obese, no JVD, carotid bruits, or masses. Cardiac: Irregularly irregular, 2/6 systolic ejection murmur at the right upper sternal border, no rubs, or gallops. No clubbing, cyanosis, edema.  Radials/DP/PT 2+ and equal bilaterally.  Respiratory:  Respirations regular and unlabored, clear to auscultation bilaterally. GI: Soft, nontender, nondistended, BS + x 4. MS: no deformity or atrophy. Skin: warm and dry, no rash. Neuro:  Strength and sensation are intact. Psych: Normal affect.  Accessory Clinical Findings  ECG - atrial fibrillation, 70, right bundle branch block, no acute ST or T changes.  Assessment & Plan  1.  Persistent atrial fibrillation: Patient was recently hospitalized with A. fib and diastolic heart failure. She has been rate controlled on beta blocker and digoxin therapy and has since been doing well. She denies palpitations, chest pain, or dyspnea. Heart rate is 70 today. She is anticoagulated with Xarelto and is tolerating well. Her granddaughter says that she had labs drawn at [facility] the other day. We discussed through an interpreter, the management of atrial fibrillation and role of anticoagulation and eventual cardioversion. Patient has verbalized understanding and has been compliant with anticoagulation. She is not sure that she would want to proceed with cardioversion if she continues to feel well. That being the case, we will continue her current beta blocker and digoxin dosing and plan to follow-up in 2-3 weeks to reevaluate her clinical status and decide at that  point whether or not we would pursue cardioversion versus leaving her in A. fib with ongoing anticoagulation.  2. Chronic combined systolic and diastolic congestive heart failure: EF 40-45% by echo during hospitalization. She was diuresed 12 pounds. Her weight is stable compared to her discharge weight and she has been feeling well without recurrence of dyspnea or edema. She has not been weighing herself daily and I have recommended to her family that they purchased a scale so that she may or weigh herself.  We discussed the importance of daily weights, sodium restriction, medication compliance, and symptom reporting and she verbalizes understanding. Her blood pressure is elevated today. She had previously been on valsartan 320 mg however this was discontinued during hospitalization and instead she was placed on lisinopril 20 mg. She's been coughing with lisinopril. I am going to discontinue lisinopril and I will resume her previous home dose of valsartan. Continue beta blocker therapy along with digoxin.  3. Essential hypertension: As above, blood pressure is elevated today. We will discontinue lisinopril in favor of the higher dose of ARB therapy that she was previously taking.  4. Stage 3-4 chronic kidney disease: Creatinine was stable throughout her hospitalization and she was prescribed the normal dose of Xarelto. Her granddaughter says that she had labs checked earlier this week at [place] and therefore I will not repeat labs today. We will need to monitor her renal function closely as an outpatient as if renal function worsened, we would have to reduce her dose of Xarelto.  5. Type 2 diabetes mellitus: At discharge, patient was advised to discontinue pioglitazone therapy secondary to LV dysfunction. She has been taking it daily. I have reiterated that she needs to discontinue pioglitazone therapy and have asked her to increase her Lantus to 44 units at bedtime. She will have ongoing diabetes  follow-up at [facility].  6. Morbid obesity: The patient is relatively sedentary and has no plans to begin an exercise regimen.  7. Mild aortic stenosis/bicuspid aortic valve: This was noted on recent echo. Follow-up echo in 1 year.  8.  Disposition: Follow-up with Dr. Stanford Breed in 2-3 weeks to reevaluate rhythm and volume status and determine whether or not she would be willing to undergo cardioversion.    Murray Hodgkins, NP 09/07/2014, 10:28 AM

## 2014-09-07 NOTE — Patient Instructions (Addendum)
Medication Instructions:   START TAKING  VALSARTAN 320 MG ONCE A DAY  LANTUS 44 UNITS AT BEDTIME   STOP TAKING LISNOPRIL AND PIOGLITAZONE    Labwork:  NONE ORDER TODAY   Testing/Procedures:  NONE ORDER TODAY  Follow-Up:  IN 2 TO 3 WEEKS  Any Other Special Instructions Will Be Listed Below (If Applicable).

## 2014-09-28 ENCOUNTER — Encounter: Payer: Self-pay | Admitting: Cardiology

## 2014-09-28 ENCOUNTER — Ambulatory Visit (INDEPENDENT_AMBULATORY_CARE_PROVIDER_SITE_OTHER): Payer: Medicare Other | Admitting: Cardiology

## 2014-09-28 VITALS — BP 154/90 | HR 92 | Ht 62.0 in | Wt 201.8 lb

## 2014-09-28 DIAGNOSIS — I5031 Acute diastolic (congestive) heart failure: Secondary | ICD-10-CM

## 2014-09-28 DIAGNOSIS — I481 Persistent atrial fibrillation: Secondary | ICD-10-CM | POA: Diagnosis not present

## 2014-09-28 DIAGNOSIS — I4891 Unspecified atrial fibrillation: Secondary | ICD-10-CM

## 2014-09-28 DIAGNOSIS — I4819 Other persistent atrial fibrillation: Secondary | ICD-10-CM

## 2014-09-28 DIAGNOSIS — I10 Essential (primary) hypertension: Secondary | ICD-10-CM | POA: Diagnosis not present

## 2014-09-28 DIAGNOSIS — I350 Nonrheumatic aortic (valve) stenosis: Secondary | ICD-10-CM

## 2014-09-28 MED ORDER — METOPROLOL TARTRATE 25 MG PO TABS: 100.0000 mg | ORAL_TABLET | Freq: Two times a day (BID) | ORAL | Status: DC

## 2014-09-28 MED ORDER — ROSUVASTATIN CALCIUM 10 MG PO TABS: 10.0000 mg | ORAL_TABLET | Freq: Every day | ORAL | Status: DC

## 2014-09-28 NOTE — Progress Notes (Signed)
Cardiology Office Note   Date:  09/28/2014   ID:  Clydie, Schenker October 15, 1935, MRN [id number]  PCP:  No primary care provider on file.  Cardiologist:  Dr. Oval Linsey - she does not remember Dr. Stanford Breed    Chief Complaint  Patient presents with  . Atrial Fibrillation    feels the fluttering at times      History of Present Illness: Lisa [name] is a 79 y.o. female who presents for atrial fib follow up.  Pt is from [place] and speaks no english.  She is here with her husband and interpreter.   prior history of hypertension, diabetes, stage III kidney disease, obesity, and sleep apnea. She had been experiencing progressive dyspnea on exertion and was sent for an echocardiogram and subsequent found to be in A. fib with RVR. She was admitted to [facility] where she was volume overloaded. She was diuresed and lost 12 pounds with clinical improvement. With regards to atrial fibrillation, she was placed on metoprolol and digoxin with reasonable rate control. Xarelto was added. Echocardiogram showed an EF of 40-45% and she was placed on beta blocker and ACE inhibitor therapy. Plan was for rate control and anticoagulation for 4 weeks followed by cardioversion.  CHADSVASC score of 5 so continues on Xarelto and has not missed any doses.    She is doing well no complaints, no SOB, no chest pain.  She does feel a fluttering at times.  Her BP is elevated today.  Her HR is 92 as well.    We discussed at length DCCV, but pt does not seem to grasp what we have described, her husband does, but part of the pt's fear is that she will not wake up.   She states someone stopped her crestor.  Past Medical History  Diagnosis Date  . Chronic combined systolic and diastolic CHF (congestive heart failure)     a. 08/2014 Echo: EF 40-45%, no rwma, bicuspid AoV, mild AS, mod dil Asc Ao, PASP 43mmHg.  Marland Kitchen Anemia   . Essential hypertension   . Diabetes mellitus without complication   . Vitamin D deficiency     . CKD (chronic kidney disease), stage III   . Carotid arterial disease   . Osteoarthritis of both ankles   . Mild aortic stenosis     a. 08/2014 Echo: mild AS, bicuspid AoV.  . OSA on CPAP   . Persistent atrial fibrillation     a. Dx 08/2013, CHA2DS2VASc=5-->Xarelto.    Past Surgical History  Procedure Laterality Date  . Cholecystectomy       Current Outpatient Prescriptions  Medication Sig Dispense Refill  . B-D INS SYR ULTRAFINE .5CC/29G 29G X 1/2" 0.5 ML MISC USE UTD TO INJECT INSULIN TWICE DAILY  6  . digoxin (LANOXIN) 0.125 MG tablet Take 1 tablet (0.125 mg total) by mouth daily. 30 tablet 11  . furosemide (LASIX) 20 MG tablet Take 1 tablet (20 mg total) by mouth daily. 30 tablet 11  . LANTUS 100 UNIT/ML injection Inject 0.44 mLs (44 Units total) into the skin at bedtime. 10 mL 2  . metoprolol tartrate (LOPRESSOR) 25 MG tablet Take 4 tablets (100 mg total) by mouth 2 (two) times daily. 360 tablet 11  . rivaroxaban (XARELTO) 20 MG TABS tablet Take 1 tablet (20 mg total) by mouth daily with supper. 30 tablet 11  . valsartan (DIOVAN) 320 MG tablet Take 1 tablet (320 mg total) by mouth daily. 30 tablet 6  . rosuvastatin (CRESTOR) 10  MG tablet Take 1 tablet (10 mg total) by mouth daily. 90 tablet 3   No current facility-administered medications for this visit.    Allergies:   Lisinopril    Social History:  The patient  reports that she has quit smoking. She has never used smokeless tobacco. She reports that she does not drink alcohol or use illicit drugs.   Family History:  The patient's family history includes Stroke in her sister. There is no history of Heart attack.    ROS:  General:no colds or fevers,  weight has decreased Skin:no rashes or ulcers HEENT:no blurred vision, no congestion CV:see HPI PUL:see HPI GI:no diarrhea constipation or melena, no indigestion GU:no hematuria, no dysuria MS:no joint pain, no claudication Neuro:no syncope, no lightheadedness Endo:+  diabetes mostly stable unless she eats more carbs, no thyroid disease  Wt Readings from Last 3 Encounters:  09/28/14 201 lb 12.8 oz (91.536 kg)  09/07/14 202 lb (91.627 kg)  08/28/14 200 lb 4.8 oz (90.855 kg)     PHYSICAL EXAM: VS:  BP 154/90 mmHg  Pulse 92  Ht 5\' 2"  (1.575 m)  Wt 201 lb 12.8 oz (91.536 kg)  BMI 36.90 kg/m2 , BMI Body mass index is 36.9 kg/(m^2). General:Pleasant affect, NAD Skin:Warm and dry, brisk capillary refill HEENT:normocephalic, sclera clear, mucus membranes moist Neck:supple, no JVD, + carotid bruits  Heart:Irreg irreg  With 2/6 systolic murmur, no gallup, rub or click Lungs:clear without rales, rhonchi, or wheezes [id number], non tender, + BS, do not palpate liver spleen or masses Ext:no to trace lower ext edema, 1+ pedal pulses, 2+ radial pulses Neuro:alert and oriented X 3, MAE, follows commands, + facial symmetry    EKG:  EKG is ordered today. The ekg ordered today demonstrates atrial fib with RBBB no acute changes. Rate of 92.   Recent Labs: 08/23/2014: ALT 14; B Natriuretic Peptide 234.7*; TSH 2.057 08/24/2014: Magnesium 2.1 08/27/2014: BUN 22*; Creatinine, Ser 0.97; Potassium 3.6; Sodium 138 08/28/2014: Hemoglobin 13.2; Platelets 265    Lipid Panel    Component Value Date/Time   CHOL 128 08/24/2014 0407   TRIG 77 08/24/2014 0407   HDL 30* 08/24/2014 0407   CHOLHDL 4.3 08/24/2014 0407   VLDL 15 08/24/2014 0407   LDLCALC 83 08/24/2014 0407       Other studies Reviewed: Additional studies/ records that were reviewed today include: .  ECHO: Study Conclusions  - Left ventricle: The cavity size was normal. There was moderate concentric hypertrophy. Systolic function was mildly to moderately reduced. The estimated ejection fraction was in the range of 40% to 45%. Wall motion was normal; there were no regional wall motion abnormalities. - Aortic valve: Functionally bicuspid; moderately thickened, moderately calcified leaflets.  Valve mobility was restricted. There was mild stenosis. Mean gradient (S): 9 mm Hg. Peak gradient (S): 18 mm Hg. - Aortic root: The aortic root was normal in size. - Ascending aorta: The ascending aorta was moderately dilated measuring 44 mm. - Mitral valve: Calcified annulus. Mildly thickened leaflets . There was trivial regurgitation. - Left atrium: The atrium was mildly dilated. - Right ventricle: Systolic function was normal. - Right atrium: The atrium was normal in size. - Tricuspid valve: Structurally normal valve. There was mild-moderate regurgitation. - Pulmonary arteries: Systolic pressure was mildly increased. PA peak pressure: 39 mm Hg (S). - Inferior vena cava: The vessel was normal in size. - Pericardium, extracardiac: There was no pericardial effusion.  Impressions:  - Mildly decreased LVEF, estimate can be affected by  rapid atrial fibrillation. Functionally bicuspid aortic valve with mild aortic stenosis and moderately dilated ascending aorta measuring 44 mm.   ASSESSMENT AND PLAN:  1.  A fib with decreased rate control at times.  BP is elevated also  Increase metoprolol to 100 BID, we discussed DCCV today at length but pt is unsure, we agreed to have better control and follow up with Dr. Oval Linsey in 3 weeks or so with interpreter to further discuss.  Instructed not to miss any xarelto.  We did give pamphlet on DCCV and interpreter will go over.  2. No chest pain. troponins were negative.  3. Decreased EF but is on ARB. May be lower due to the a fib.   4. Re added crestor 10   Current medicines are reviewed with the patient today.  The patient Has no concerns regarding medicines.  The following changes have been made:  See above Labs/ tests ordered today include:see above  Disposition:   FU:  see above  Signed, Isaiah Serge, NP  09/28/2014 1:23 PM    [facility] [place], [place] [place], [place] Phone: [phone]; Fax: [phone] cv

## 2014-09-28 NOTE — Patient Instructions (Addendum)
Medication Instructions:  Your physician has recommended you make the following change in your medication:  1.  Start Crestor 10 mg take 1 tablet daily 2.  Increase your Metoprolol 25 mg taking 4 tablets twice a day   Labwork: None ordered  Testing/Procedures: None ordered  Follow-Up: Your physician recommends that you schedule a follow-up appointment with Dr. Skeet Latch in 2-3 weeks   Any Other Special Instructions Will Be Listed Below (If Applicable).     Electrical Cardioversion Electrical cardioversion is the delivery of a jolt of electricity to change the rhythm of the heart. Sticky patches or metal paddles are placed on the chest to deliver the electricity from a device. This is done to restore a normal rhythm. A rhythm that is too fast or not regular keeps the heart from pumping well. Electrical cardioversion is done in an emergency if:   There is low or no blood pressure as a result of the heart rhythm.   Normal rhythm must be restored as fast as possible to protect the brain and heart from further damage.   It may save a life. Cardioversion may be done for heart rhythms that are not immediately life threatening, such as atrial fibrillation or flutter, in which:   The heart is beating too fast or is not regular.   Medicine to change the rhythm has not worked.   It is safe to wait in order to allow time for preparation.  Symptoms of the abnormal rhythm are bothersome.  The risk of stroke and other serious problems can be reduced. LET [facility] CARE PROVIDER KNOW ABOUT:   Any allergies you have.  All medicines you are taking, including vitamins, herbs, eye drops, creams, and over-the-counter medicines.  Previous problems you or members of your family have had with the use of anesthetics.   Any blood disorders you have.   Previous surgeries you have had.   Medical conditions you have. RISKS AND COMPLICATIONS  Generally, this is a safe  procedure. However, problems can occur and include:   Breathing problems related to the anesthetic used.  A blood clot that breaks free and travels to other parts of your body. This could cause a stroke or other problems. The risk of this is lowered by use of blood-thinning medicine (anticoagulant) prior to the procedure.  Cardiac arrest (rare). BEFORE THE PROCEDURE   You may have tests to detect blood clots in your heart and to evaluate heart function.  You may start taking anticoagulants so your blood does not clot as easily.   Medicines may be given to help stabilize your heart rate and rhythm. PROCEDURE  You will be given medicine through an IV tube to reduce discomfort and make you sleepy (sedative).   An electrical shock will be delivered. AFTER THE PROCEDURE Your heart rhythm will be watched to make sure it does not change.  Document Released: 01/10/2002 Document Revised: 06/06/2013 Document Reviewed: 08/04/2012 [facility] Patient Information 2015 [place], [place]. This information is not intended to replace advice given to you by your health care provider. Make sure you discuss any questions you have with your health care provider.

## 2014-10-31 NOTE — Progress Notes (Signed)
Cardiology Office Note   Date:  11/01/2014   ID:  Riva Walli, DOB 1935-04-03, MRN [id number]  PCP:  No primary care provider on file.  Cardiologist:   Sharol Harness, MD   Chief Complaint  Patient presents with  . Follow-up  . Dizziness    A FEW DAYS      History of Present Illness: Lisa [name] is a 79 y.o. female with hypertension, persistent atrial fibrillation, chronic systolic (LVEF [id number]) and diastolic heart failure, bicuspid aortic valve with mild AS, OSA, CKD IV and DM Type 2 with who presents for a follow up on atrial fibrillation.  She is accompanied by her husband and speaks via a Optometrist.  Ms. Gonterman was seen by Cecilie Kicks on 8/25 and discussed DCCV.  However, she did not understand the concept and presents today to continue the discussion.  Her heart rate was 97 bpm and metoprolol was increased to 100 mg twice a day. Sensing lower she is feeling much better. She denies any chest pain, palpitations, lightheadedness, dizziness, shortness of breath or lower extremity edema. She is feeling quite well and is without complaint at this time.  She continues to take Xarelto as prescribed    Past Medical History  Diagnosis Date  . Chronic combined systolic and diastolic CHF (congestive heart failure)     a. 08/2014 Echo: EF 40-45%, no rwma, bicuspid AoV, mild AS, mod dil Asc Ao, PASP 45mmHg.  Marland Kitchen Anemia   . Essential hypertension   . Diabetes mellitus without complication   . Vitamin D deficiency   . CKD (chronic kidney disease), stage III   . Carotid arterial disease   . Osteoarthritis of both ankles   . Mild aortic stenosis     a. 08/2014 Echo: mild AS, bicuspid AoV.  . OSA on CPAP   . Persistent atrial fibrillation     a. Dx 08/2013, CHA2DS2VASc=5-->Xarelto.    Past Surgical History  Procedure Laterality Date  . Cholecystectomy       Current Outpatient Prescriptions  Medication Sig Dispense Refill  . B-D INS SYR ULTRAFINE .5CC/29G 29G X 1/2"  0.5 ML MISC USE UTD TO INJECT INSULIN TWICE DAILY  6  . digoxin (LANOXIN) 0.125 MG tablet Take 1 tablet (0.125 mg total) by mouth daily. 30 tablet 11  . furosemide (LASIX) 20 MG tablet Take 1 tablet (20 mg total) by mouth daily. 30 tablet 11  . LANTUS 100 UNIT/ML injection Inject 0.44 mLs (44 Units total) into the skin at bedtime. 10 mL 2  . metoprolol tartrate (LOPRESSOR) 25 MG tablet Take 4 tablets (100 mg total) by mouth 2 (two) times daily. 360 tablet 11  . rivaroxaban (XARELTO) 20 MG TABS tablet Take 1 tablet (20 mg total) by mouth daily with supper. 30 tablet 11  . rosuvastatin (CRESTOR) 10 MG tablet Take 1 tablet (10 mg total) by mouth daily. 90 tablet 3  . valsartan (DIOVAN) 320 MG tablet Take 1 tablet (320 mg total) by mouth daily. 30 tablet 6   No current facility-administered medications for this visit.    Allergies:   Lisinopril    Social History:  The patient  reports that she has quit smoking. She has never used smokeless tobacco. She reports that she does not drink alcohol or use illicit drugs.   Family History:  The patient's family history includes Stroke in her sister. There is no history of Heart attack.    ROS:  Please see the history of present  illness.   Otherwise, review of systems are positive for none.   All other systems are reviewed and negative.    PHYSICAL EXAM: VS:  BP 158/80 mmHg  Pulse 77  Ht 5' (1.524 m)  Wt 86.637 kg (191 lb)  BMI 37.30 kg/m2 , BMI Body mass index is 37.3 kg/(m^2). GENERAL:  Well appearing HEENT:  Pupils equal round and reactive, fundi not visualized, oral mucosa unremarkable NECK:  No jugular venous distention, waveform within normal limits, carotid upstroke brisk and symmetric, no bruits, no thyromegaly LYMPHATICS:  No cervical adenopathy LUNGS:  Clear to auscultation bilaterally HEART:  Irregularly irregular. PMI not displaced or sustained,S1 and S2 within normal limits, no S3, no S4, no clicks, no rubs, III/VI early-peaking  crescendo-decrescendo murmur at LUSB with radiation to the carotids ABD:  Flat, positive bowel sounds normal in frequency in pitch, no bruits, no rebound, no guarding, no midline pulsatile mass, no hepatomegaly, no splenomegaly EXT:  2 plus pulses throughout, no edema, no cyanosis no clubbing SKIN:  No rashes no nodules NEURO:  Cranial nerves II through XII grossly intact, motor grossly intact throughout PSYCH:  Cognitively intact, oriented to person place and time    EKG:  EKG is ordered today. The ekg ordered today demonstrates atrial fibrillation rate 77 bpm.  RBBB.   Recent Labs: 08/23/2014: ALT 14; B Natriuretic Peptide 234.7*; TSH 2.057 08/24/2014: Magnesium 2.1 08/27/2014: BUN 22*; Creatinine, Ser 0.97; Potassium 3.6; Sodium 138 08/28/2014: Hemoglobin 13.2; Platelets 265    Lipid Panel    Component Value Date/Time   CHOL 128 08/24/2014 0407   TRIG 77 08/24/2014 0407   HDL 30* 08/24/2014 0407   CHOLHDL 4.3 08/24/2014 0407   VLDL 15 08/24/2014 0407   LDLCALC 83 08/24/2014 0407      Wt Readings from Last 3 Encounters:  11/01/14 86.637 kg (191 lb)  09/28/14 91.536 kg (201 lb 12.8 oz)  09/07/14 91.627 kg (202 lb)    Echo 08/24/14: Study Conclusions  - Left ventricle: The cavity size was normal. There was moderate concentric hypertrophy. Systolic function was mildly to moderately reduced. The estimated ejection fraction was in the range of 40% to 45%. Wall motion was normal; there were no regional wall motion abnormalities. - Aortic valve: Functionally bicuspid; moderately thickened, moderately calcified leaflets. Valve mobility was restricted. There was mild stenosis. Mean gradient (S): 9 mm Hg. Peak gradient (S): 18 mm Hg. - Aortic root: The aortic root was normal in size. - Ascending aorta: The ascending aorta was moderately dilated measuring 44 mm. - Mitral valve: Calcified annulus. Mildly thickened leaflets . There was trivial regurgitation. -  Left atrium: The atrium was mildly dilated. - Right ventricle: Systolic function was normal. - Right atrium: The atrium was normal in size. - Tricuspid valve: Structurally normal valve. There was mild-moderate regurgitation. - Pulmonary arteries: Systolic pressure was mildly increased. PA peak pressure: 39 mm Hg (S). - Inferior vena cava: The vessel was normal in size. - Pericardium, extracardiac: There was no pericardial effusion.  Other studies Reviewed: Additional studies/ records that were reviewed today include: . Review of the above records demonstrates:  Please see elsewhere in the note.     ASSESSMENT AND PLAN:  # Persistent atrial fibrillation:  CHA2DS2-VASc is 5.  Rates are better controlled on the increased dose of metoprolol. She does not have any symptoms at this time. She would prefer not to undergo DC cardioversion and she is feeling well. This seems reasonable. - Continue metoprolol 100mg  bid -  Continue Xarelto - Continue digoxin  # Hypertension: BP is not well controlled today. She states that she has not yet taken her morning medications. She checks her blood pressure at home and it typically is [id number] to [id number] systolic. She also endorses some white coat hypertension. Therefore we will not make any changes to her regimen at this time. She is to continue metoprolol, and valsartan.  # Chronic systolic heart failure: LVEF 45%.  She is euvolemic on exam today. She is to continue metoprolol, losartan  and digoxin. She is to continue her Lasix at current dosing.  # Mild aortic stenosis/bicuspid aortic valve: Euvolemic.  Repeat echo in 2 years.  Current medicines are reviewed at length with the patient today.  The patient does not have concerns regarding medicines.  The following changes have been made:  no change  Labs/ tests ordered today include:  No orders of the defined types were placed in this encounter.     Disposition:   FU with Tiffany C. Oval Linsey, MD in 6  months.    Signed, Sharol Harness, MD  11/01/2014 10:40 AM    [place]

## 2014-11-01 ENCOUNTER — Ambulatory Visit (INDEPENDENT_AMBULATORY_CARE_PROVIDER_SITE_OTHER): Payer: Medicare Other | Admitting: Cardiovascular Disease

## 2014-11-01 ENCOUNTER — Encounter: Payer: Self-pay | Admitting: Cardiovascular Disease

## 2014-11-01 VITALS — BP 158/80 | HR 77 | Ht 60.0 in | Wt 191.0 lb

## 2014-11-01 DIAGNOSIS — I10 Essential (primary) hypertension: Secondary | ICD-10-CM

## 2014-11-01 DIAGNOSIS — Q231 Congenital insufficiency of aortic valve: Secondary | ICD-10-CM

## 2014-11-01 DIAGNOSIS — Q230 Congenital stenosis of aortic valve: Secondary | ICD-10-CM

## 2014-11-01 DIAGNOSIS — I480 Paroxysmal atrial fibrillation: Secondary | ICD-10-CM | POA: Diagnosis not present

## 2014-11-01 NOTE — Patient Instructions (Signed)
Your physician wants you to follow-up in: 6 months with Dr. Oval Linsey.  You will receive a reminder letter in the mail two months in advance. If you don't receive a letter, please call our office to schedule the follow-up appointment.

## 2014-11-06 ENCOUNTER — Other Ambulatory Visit: Payer: Self-pay | Admitting: *Deleted

## 2014-12-21 ENCOUNTER — Other Ambulatory Visit: Payer: Self-pay | Admitting: Physician Assistant

## 2015-01-01 ENCOUNTER — Other Ambulatory Visit: Payer: Self-pay | Admitting: Physician Assistant

## 2015-01-02 NOTE — Telephone Encounter (Signed)
Rx(s) sent to pharmacy electronically.  

## 2015-02-07 NOTE — Telephone Encounter (Signed)
LMOVM TO MAKE SURE GRANDMOTHER INCREASE LANTUS UNITS AT BEDTIME TO 44 UNITS DUE TO THE FACT IT WASNT ON THE ORGINAL OFFICE NOTE GIVEN. MENTION WILL CALL BACK TO VERFIY MESSAGE RECIEVED

## 2015-05-07 ENCOUNTER — Ambulatory Visit: Payer: Medicare Other | Admitting: Cardiovascular Disease

## 2015-06-01 ENCOUNTER — Ambulatory Visit (INDEPENDENT_AMBULATORY_CARE_PROVIDER_SITE_OTHER): Payer: Medicare Other | Admitting: Cardiovascular Disease

## 2015-06-01 VITALS — BP 186/80 | HR 78 | Ht 60.0 in | Wt 173.2 lb

## 2015-06-01 DIAGNOSIS — I10 Essential (primary) hypertension: Secondary | ICD-10-CM

## 2015-06-01 DIAGNOSIS — I5042 Chronic combined systolic (congestive) and diastolic (congestive) heart failure: Secondary | ICD-10-CM | POA: Diagnosis not present

## 2015-06-01 DIAGNOSIS — I350 Nonrheumatic aortic (valve) stenosis: Secondary | ICD-10-CM | POA: Diagnosis not present

## 2015-06-01 DIAGNOSIS — I4819 Other persistent atrial fibrillation: Secondary | ICD-10-CM

## 2015-06-01 DIAGNOSIS — Q231 Congenital insufficiency of aortic valve: Secondary | ICD-10-CM

## 2015-06-01 DIAGNOSIS — I481 Persistent atrial fibrillation: Secondary | ICD-10-CM

## 2015-06-01 MED ORDER — CARVEDILOL 25 MG PO TABS: 25.0000 mg | ORAL_TABLET | Freq: Two times a day (BID) | ORAL | Status: DC

## 2015-06-01 NOTE — Patient Instructions (Signed)
Medication Instructions:  STOP METOPROLOL   START CARVEDILOL 25 MG TWICE A DAY   Labwork: NONE  Testing/Procedures: NONE  Follow-Up: Your physician recommends that you schedule a follow-up appointment in: 1 MONTH OV  If you need a refill on your cardiac medications before your next appointment, please call your pharmacy.

## 2015-06-01 NOTE — Progress Notes (Signed)
Cardiology Office Note   Date:  06/01/2015   ID:  Lisa [name], DOB 07/09/1935, MRN [id number]  PCP:  No PCP Per Patient  Cardiologist:   Skeet Latch, MD   Chief Complaint  Patient presents with  . Follow-up    6 month--persistent AFIB  pt states daughter sick--cancer; c/o dizziness when blood sugar is low, no other Sx.   Patient ID: Lisa [name] is a 80 y.o. female with hypertension, persistent atrial fibrillation, chronic systolic (LVEF [id number]) and diastolic heart failure, bicuspid aortic valve with mild AS, OSA, CKD IV and DM Type 2 with who presents for a follow up on atrial fibrillation.    Interval History 06/01/15: At her last appointment [name]'s blood pressure was poorly-controlled.  She has been feeling well.  She complains of leg pain.  Vitamin D helps but it is expensive.  She is also struggling with her insulin injections.  They are expensive and they don't give her enough syringes.  She denies chest pain, shortness of breath, palpitations, lower extremity edema, orthopnea or PND.  She has been using her CPAP machine, which helps a lot  Ms. Stanke walks every morning for 30 minutes to 1 hour.  She also tries to do exercises in her room daily.    History of Present Illness 10/31/14: She is accompanied by her husband and speaks via a Optometrist.  [name] was seen by Cecilie Kicks on 8/25 and discussed DCCV.  However, she did not understand the concept and presents today to continue the discussion.  Her heart rate was 97 bpm and metoprolol was increased to 100 mg twice a day. Sensing lower she is feeling much better. She denies any chest pain, palpitations, lightheadedness, dizziness, shortness of breath or lower extremity edema. She is feeling quite well and is without complaint at this time.  She continues to take Xarelto as prescribed    Past Medical History  Diagnosis Date  . Chronic combined systolic and diastolic CHF (congestive heart failure)       a. 08/2014 Echo: EF 40-45%, no rwma, bicuspid AoV, mild AS, mod dil Asc Ao, PASP 71mmHg.  Marland Kitchen Anemia   . Essential hypertension   . Diabetes mellitus without complication   . Vitamin D deficiency   . CKD (chronic kidney disease), stage III   . Carotid arterial disease   . Osteoarthritis of both ankles   . Mild aortic stenosis     a. 08/2014 Echo: mild AS, bicuspid AoV.  . OSA on CPAP   . Persistent atrial fibrillation     a. Dx 08/2013, CHA2DS2VASc=5-->Xarelto.    Past Surgical History  Procedure Laterality Date  . Cholecystectomy       Current Outpatient Prescriptions  Medication Sig Dispense Refill  . B-D INS SYR ULTRAFINE .5CC/29G 29G X 1/2" 0.5 ML MISC USE UTD TO INJECT INSULIN TWICE DAILY  6  . digoxin (LANOXIN) 0.125 MG tablet Take 1 tablet (0.125 mg total) by mouth daily. 30 tablet 11  . furosemide (LASIX) 20 MG tablet Take 1 tablet (20 mg total) by mouth daily. 30 tablet 11  . LANTUS 100 UNIT/ML injection Inject 0.44 mLs (44 Units total) into the skin at bedtime. 10 mL 2  . LANTUS 100 UNIT/ML injection INJECT 20 UNITS UNDER THE SKIN EVERY NIGHT AT BEDTIME 10 mL 0  . metoprolol tartrate (LOPRESSOR) 25 MG tablet Take 4 tablets (100 mg total) by mouth 2 (two) times daily. 360 tablet 11  . rivaroxaban (XARELTO)  20 MG TABS tablet Take 1 tablet (20 mg total) by mouth daily with supper. 30 tablet 11  . rosuvastatin (CRESTOR) 10 MG tablet Take 1 tablet (10 mg total) by mouth daily. 90 tablet 3  . valsartan (DIOVAN) 320 MG tablet Take 1 tablet (320 mg total) by mouth daily. 30 tablet 6   No current facility-administered medications for this visit.    Allergies:   Lisinopril    Social History:  The patient  reports that she has quit smoking. She has never used smokeless tobacco. She reports that she does not drink alcohol or use illicit drugs.   Family History:  The patient's family history includes Stroke in her sister. There is no history of Heart attack.    ROS:  Please  see the history of present illness.   Otherwise, review of systems are positive for none.   All other systems are reviewed and negative.    PHYSICAL EXAM: VS:  BP 186/80 mmHg  Pulse 78  Ht 5' (1.524 m)  Wt 78.563 kg (173 lb 3.2 oz)  BMI 33.83 kg/m2 , BMI Body mass index is 33.83 kg/(m^2). GENERAL:  Well appearing HEENT:  Pupils equal round and reactive, fundi not visualized, oral mucosa unremarkable NECK:  No jugular venous distention, waveform within normal limits, carotid upstroke brisk and symmetric, no bruits, no thyromegaly LYMPHATICS:  No cervical adenopathy LUNGS:  Clear to auscultation bilaterally HEART:  Irregularly irregular. PMI not displaced or sustained,S1 and S2 within normal limits, no S3, no S4, no clicks, no rubs, III/VI early-peaking crescendo-decrescendo murmur at LUSB with radiation to the carotids ABD:  Flat, positive bowel sounds normal in frequency in pitch, no bruits, no rebound, no guarding, no midline pulsatile mass, no hepatomegaly, no splenomegaly EXT:  2 plus pulses throughout, no edema, no cyanosis no clubbing SKIN:  No rashes no nodules NEURO:  Cranial nerves II through XII grossly intact, motor grossly intact throughout PSYCH:  Cognitively intact, oriented to person place and time    EKG:  EKG is ordered today. The ekg ordered today demonstrates atrial fibrillation rate 78 bpm.  RBBB.  LPFB.     Recent Labs: 08/23/2014: ALT 14; B Natriuretic Peptide 234.7*; TSH 2.057 08/24/2014: Magnesium 2.1 08/27/2014: BUN 22*; Creatinine, Ser 0.97; Potassium 3.6; Sodium 138 08/28/2014: Hemoglobin 13.2; Platelets 265    Lipid Panel    Component Value Date/Time   CHOL 128 08/24/2014 0407   TRIG 77 08/24/2014 0407   HDL 30* 08/24/2014 0407   CHOLHDL 4.3 08/24/2014 0407   VLDL 15 08/24/2014 0407   LDLCALC 83 08/24/2014 0407      Wt Readings from Last 3 Encounters:  06/01/15 78.563 kg (173 lb 3.2 oz)  11/01/14 86.637 kg (191 lb)  09/28/14 91.536 kg (201 lb  12.8 oz)    Echo 08/24/14: Study Conclusions  - Left ventricle: The cavity size was normal. There was moderate concentric hypertrophy. Systolic function was mildly to moderately reduced. The estimated ejection fraction was in the range of 40% to 45%. Wall motion was normal; there were no regional wall motion abnormalities. - Aortic valve: Functionally bicuspid; moderately thickened, moderately calcified leaflets. Valve mobility was restricted. There was mild stenosis. Mean gradient (S): 9 mm Hg. Peak gradient (S): 18 mm Hg. - Aortic root: The aortic root was normal in size. - Ascending aorta: The ascending aorta was moderately dilated measuring 44 mm. - Mitral valve: Calcified annulus. Mildly thickened leaflets . There was trivial regurgitation. - Left atrium: The atrium  was mildly dilated. - Right ventricle: Systolic function was normal. - Right atrium: The atrium was normal in size. - Tricuspid valve: Structurally normal valve. There was mild-moderate regurgitation. - Pulmonary arteries: Systolic pressure was mildly increased. PA peak pressure: 39 mm Hg (S). - Inferior vena cava: The vessel was normal in size. - Pericardium, extracardiac: There was no pericardial effusion.  Other studies Reviewed: Additional studies/ records that were reviewed today include: . Review of the above records demonstrates:  Please see elsewhere in the note.     ASSESSMENT AND PLAN:  # Hypertension: BP is not well controlled today. She has taken her morning medications already.  We will stop metoprolol and start carvedilol 25 mg bid.  Continue valsartan.  # Persistent atrial fibrillation:  CHA2DS2-VASc is 5.  Rate is well-controlled.  We are switching from metoprolol for better BP control as above.  Continue Xarelto and digoxin.  Check renal function and digoxin level at the next appointment.  # Chronic systolic and diastolic heart failure: LVEF 45%.  She is euvolemic on exam  today. She is to continue losartan, lasix and digoxin. Start carvedilol as above.  # Mild aortic stenosis/bicuspid aortic valve: Euvolemic.  Repeat echo in 2 years.  Current medicines are reviewed at length with the patient today.  The patient does not have concerns regarding medicines.  The following changes have been made:  Stop metoprolol and start carvedilol.  Labs/ tests ordered today include:  No orders of the defined types were placed in this encounter.     Disposition:   FU with Lelia Jons C. Oval Linsey, MD in 1 month.    Signed, Skeet Latch, MD  06/01/2015 4:01 PM    [facility]

## 2015-06-02 ENCOUNTER — Encounter: Payer: Self-pay | Admitting: Cardiovascular Disease

## 2015-06-05 ENCOUNTER — Telehealth: Payer: Self-pay | Admitting: *Deleted

## 2015-06-05 DIAGNOSIS — E119 Type 2 diabetes mellitus without complications: Principal | ICD-10-CM

## 2015-06-05 DIAGNOSIS — IMO0001 Reserved for inherently not codable concepts without codable children: Secondary | ICD-10-CM

## 2015-06-05 DIAGNOSIS — Z794 Long term (current) use of insulin: Principal | ICD-10-CM

## 2015-06-05 NOTE — Telephone Encounter (Signed)
Dr Oval Linsey requested referral to endocrinology for his diabetes Referral placed in Epic

## 2015-06-12 NOTE — Telephone Encounter (Signed)
Appointment in [place] on 6/29

## 2015-06-29 ENCOUNTER — Encounter: Payer: Self-pay | Admitting: Cardiovascular Disease

## 2015-06-29 ENCOUNTER — Ambulatory Visit (INDEPENDENT_AMBULATORY_CARE_PROVIDER_SITE_OTHER): Payer: Medicare Other | Admitting: Cardiovascular Disease

## 2015-06-29 VITALS — BP 188/81 | HR 61 | Ht 60.0 in | Wt 176.8 lb

## 2015-06-29 DIAGNOSIS — I5042 Chronic combined systolic (congestive) and diastolic (congestive) heart failure: Secondary | ICD-10-CM

## 2015-06-29 DIAGNOSIS — I481 Persistent atrial fibrillation: Secondary | ICD-10-CM | POA: Diagnosis not present

## 2015-06-29 DIAGNOSIS — Z79899 Other long term (current) drug therapy: Secondary | ICD-10-CM

## 2015-06-29 DIAGNOSIS — I4819 Other persistent atrial fibrillation: Secondary | ICD-10-CM

## 2015-06-29 DIAGNOSIS — I350 Nonrheumatic aortic (valve) stenosis: Secondary | ICD-10-CM

## 2015-06-29 DIAGNOSIS — I110 Hypertensive heart disease with heart failure: Secondary | ICD-10-CM | POA: Diagnosis not present

## 2015-06-29 MED ORDER — RIVAROXABAN 20 MG PO TABS: 20.0000 mg | ORAL_TABLET | Freq: Every day | ORAL | Status: DC

## 2015-06-29 NOTE — Progress Notes (Signed)
Cardiology Office Note   Date:  06/29/2015   ID:  Lisa [name], DOB Dec 20, 1935, MRN [id number]  PCP:  No PCP Per Patient  Cardiologist:   Skeet Latch, MD   No chief complaint on file.  Patient ID: Lisa [name] is a 80 y.o. female from [place] with hypertension, persistent atrial fibrillation, chronic systolic (LVEF [id number]) and diastolic heart failure, bicuspid aortic valve with mild AS, OSA, CKD IV and DM Type 2 with who presents for a follow up on atrial fibrillation.  Her heart rate has been controlled on metoprolol.  At her last appointment her blood pressure was poorly-controlled so metoprolol was switched to carvedilol.  She notes that her BP remains poorly controlled. She brought her medications and it appears that she is taking both metoprolol and carvedilol but only takes carvedilol once daily.  This am her SBP was 120, but otherwise it has been in the 140-150s.  She endorses headache at times.  Lisa [name] also reports that her blood glucose has been very elevated.  It has been mostly in the 300s-400s.  She reports only taking aspart twice daily because she only eats twice daily.      She is accompanied by her husband and speaks via a Optometrist.     Past Medical History  Diagnosis Date  . Chronic combined systolic and diastolic CHF (congestive heart failure) ([place])     a. 08/2014 Echo: EF 40-45%, no rwma, bicuspid AoV, mild AS, mod dil Asc Ao, PASP 70mmHg.  Marland Kitchen Anemia   . Essential hypertension   . Diabetes mellitus without complication ([place])   . Vitamin D deficiency   . CKD (chronic kidney disease), stage III   . Carotid arterial disease ([place])   . Osteoarthritis of both ankles   . Mild aortic stenosis     a. 08/2014 Echo: mild AS, bicuspid AoV.  . OSA on CPAP   . Persistent atrial fibrillation ([place])     a. Dx 08/2013, CHA2DS2VASc=5-->Xarelto.    Past Surgical History  Procedure Laterality Date  . Cholecystectomy       Current Outpatient Prescriptions    Medication Sig Dispense Refill  . B-D INS SYR ULTRAFINE .5CC/29G 29G X 1/2" 0.5 ML MISC USE UTD TO INJECT INSULIN TWICE DAILY  6  . carvedilol (COREG) 25 MG tablet Take 1 tablet (25 mg total) by mouth 2 (two) times daily. 60 tablet 5  . digoxin (LANOXIN) 0.125 MG tablet Take 1 tablet (0.125 mg total) by mouth daily. 30 tablet 11  . furosemide (LASIX) 20 MG tablet Take 1 tablet (20 mg total) by mouth daily. 30 tablet 11  . LANTUS 100 UNIT/ML injection Inject 0.44 mLs (44 Units total) into the skin at bedtime. 10 mL 2  . LANTUS 100 UNIT/ML injection INJECT 20 UNITS UNDER THE SKIN EVERY NIGHT AT BEDTIME 10 mL 0  . rivaroxaban (XARELTO) 20 MG TABS tablet Take 1 tablet (20 mg total) by mouth daily with supper. 30 tablet 11  . rosuvastatin (CRESTOR) 10 MG tablet Take 1 tablet (10 mg total) by mouth daily. 90 tablet 3  . valsartan (DIOVAN) 320 MG tablet Take 1 tablet (320 mg total) by mouth daily. 30 tablet 6   No current facility-administered medications for this visit.    Allergies:   Lisinopril    Social History:  The patient  reports that she has quit smoking. She has never used smokeless tobacco. She reports that she does not drink alcohol or use illicit drugs.  Family History:  The patient's family history includes Stroke in her sister. There is no history of Heart attack.    ROS:  Please see the history of present illness.   Otherwise, review of systems are positive for none.   All other systems are reviewed and negative.    PHYSICAL EXAM: VS:  BP 188/81 mmHg  Pulse 61  Ht 5' (1.524 m)  Wt 80.196 kg (176 lb 12.8 oz)  BMI 34.53 kg/m2 , BMI Body mass index is 34.53 kg/(m^2). GENERAL:  Well appearing HEENT:  Pupils equal round and reactive, fundi not visualized, oral mucosa unremarkable NECK:  No jugular venous distention, waveform within normal limits, carotid upstroke brisk and symmetric, no bruits, no thyromegaly LYMPHATICS:  No cervical adenopathy LUNGS:  Clear to auscultation  bilaterally HEART:  Irregularly irregular. PMI not displaced or sustained,S1 and S2 within normal limits, no S3, no S4, no clicks, no rubs, III/VI early-peaking crescendo-decrescendo murmur at LUSB with radiation to the carotids ABD:  Flat, positive bowel sounds normal in frequency in pitch, no bruits, no rebound, no guarding, no midline pulsatile mass, no hepatomegaly, no splenomegaly EXT:  2 plus pulses throughout, no edema, no cyanosis no clubbing SKIN:  No rashes no nodules NEURO:  Cranial nerves II through XII grossly intact, motor grossly intact throughout PSYCH:  Cognitively intact, oriented to person place and time   EKG:  EKG is ordered today. The ekg ordered today demonstrates atrial fibrillation rate 78 bpm.  RBBB.  LPFB.     Recent Labs: 08/23/2014: ALT 14; B Natriuretic Peptide 234.7*; TSH 2.057 08/24/2014: Magnesium 2.1 08/27/2014: BUN 22*; Creatinine, Ser 0.97; Potassium 3.6; Sodium 138 08/28/2014: Hemoglobin 13.2; Platelets 265    Lipid Panel    Component Value Date/Time   CHOL 128 08/24/2014 0407   TRIG 77 08/24/2014 0407   HDL 30* 08/24/2014 0407   CHOLHDL 4.3 08/24/2014 0407   VLDL 15 08/24/2014 0407   LDLCALC 83 08/24/2014 0407      Wt Readings from Last 3 Encounters:  06/29/15 80.196 kg (176 lb 12.8 oz)  06/01/15 78.563 kg (173 lb 3.2 oz)  11/01/14 86.637 kg (191 lb)    Echo 08/24/14: Study Conclusions  - Left ventricle: The cavity size was normal. There was moderate concentric hypertrophy. Systolic function was mildly to moderately reduced. The estimated ejection fraction was in the range of 40% to 45%. Wall motion was normal; there were no regional wall motion abnormalities. - Aortic valve: Functionally bicuspid; moderately thickened, moderately calcified leaflets. Valve mobility was restricted. There was mild stenosis. Mean gradient (S): 9 mm Hg. Peak gradient (S): 18 mm Hg. - Aortic root: The aortic root was normal in size. -  Ascending aorta: The ascending aorta was moderately dilated measuring 44 mm. - Mitral valve: Calcified annulus. Mildly thickened leaflets . There was trivial regurgitation. - Left atrium: The atrium was mildly dilated. - Right ventricle: Systolic function was normal. - Right atrium: The atrium was normal in size. - Tricuspid valve: Structurally normal valve. There was mild-moderate regurgitation. - Pulmonary arteries: Systolic pressure was mildly increased. PA peak pressure: 39 mm Hg (S). - Inferior vena cava: The vessel was normal in size. - Pericardium, extracardiac: There was no pericardial effusion.  Other studies Reviewed: Additional studies/ records that were reviewed today include: . Review of the above records demonstrates:  Please see elsewhere in the note.     ASSESSMENT AND PLAN:  # Hypertension: BP is not well controlled today.  She is taking  both carvedilol and metoprolol but is only taking carvedilol bid.  She will stop taking metoprolol and take carvedilol bid.  Continue valsartan.   # Persistent atrial fibrillation:  CHA2DS2-VASc is 5.  Rate is well-controlled.  She remains in atrial fibrillation.  Continue carvedilol, Xarelto and digoxin.  We will check a BMP and a digoxin level.    # Chronic systolic and diastolic heart failure: LVEF 45%.  She is euvolemic on exam today. She is to continue carvedilol, losartan, lasix and digoxin.   # Mild aortic stenosis/bicuspid aortic valve: Euvolemic.  Repeat echo in 2 years.  # Diabetes mellitus type 2: Ms. Raiford reports very poorly-controlled glucose levels.  She only takes aspart twice daily because she eats two meals daily.  We will increase lantus to 55 units nightly and increase aspart to 25 units before meals. She is scheduled to see an endocrinologist next month.   Current medicines are reviewed at length with the patient today.  The patient does not have concerns regarding medicines.  The following changes  have been made:  Stop metoprolol and start carvedilol.  Labs/ tests ordered today include:  No orders of the defined types were placed in this encounter.    Time spent: 45 minutes-Greater than 50% of this time was spent in counseling, explanation of diagnosis, planning of further management, and coordination of care.   Disposition:   FU with Zaccheaus Storlie C. Oval Linsey, MD in 1 month.    Signed, Skeet Latch, MD  06/29/2015 3:38 PM    [place]

## 2015-06-29 NOTE — Patient Instructions (Addendum)
Medication Instructions:  INCREASE YOUR NOVOLOG TO 25 UNITS THREE TIMES A DAY WITH MEALS  INCREASE YOU LANTUS TO 55 UNITS AT BEDTIME   YOU NEED TO START TAKING THE XARELTO, DAILY AND CARVEDILOL TWICE A DAY  STOP THE METOPROLOL   Labwork: BMET/DIG LEVEL DOWNSTAIRS AT [facility] ON [facility]   Testing/Procedures: NONE  Follow-Up: Your physician recommends that you schedule a follow-up appointment in: 2 WEEKS WITH PHARM D FOR BLOOD PRESSURE  Your physician recommends that you schedule a follow-up appointment in: 2 MONTH OV  If you need a refill on your cardiac medications before your next appointment, please call your pharmacy.

## 2015-06-30 LAB — BASIC METABOLIC PANEL
BUN: 27 mg/dL — ABNORMAL HIGH (ref 7–25)
CHLORIDE: 101 mmol/L (ref 98–110)
CO2: 21 mmol/L (ref 20–31)
Calcium: 8.4 mg/dL — ABNORMAL LOW (ref 8.6–10.4)
Creat: 0.99 mg/dL — ABNORMAL HIGH (ref 0.60–0.93)
Glucose, Bld: 459 mg/dL — ABNORMAL HIGH (ref 65–99)
POTASSIUM: 4.9 mmol/L (ref 3.5–5.3)
SODIUM: 133 mmol/L — AB (ref 135–146)

## 2015-06-30 LAB — DIGOXIN LEVEL: DIGOXIN LVL: 1.1 ug/L (ref 0.8–2.0)

## 2015-07-01 ENCOUNTER — Encounter: Payer: Self-pay | Admitting: Cardiovascular Disease

## 2015-07-03 ENCOUNTER — Telehealth: Payer: Self-pay | Admitting: Cardiovascular Disease

## 2015-07-03 NOTE — Telephone Encounter (Signed)
New message     The lab tech needs to give a lab results for the pt.

## 2015-07-03 NOTE — Telephone Encounter (Signed)
Lab tech reporting lab results for patient's glucose - 459 for test performed 06/29/15, test was repeated and verified. Review of chart shows Dr. Oval Linsey has already reviewed and acknowledged result.

## 2015-07-17 ENCOUNTER — Ambulatory Visit (INDEPENDENT_AMBULATORY_CARE_PROVIDER_SITE_OTHER): Payer: Medicare Other | Admitting: Pharmacist Clinician (PhC)/ Clinical Pharmacy Specialist

## 2015-07-17 VITALS — BP 176/84 | HR 60 | Ht 60.0 in | Wt 177.4 lb

## 2015-07-17 DIAGNOSIS — I10 Essential (primary) hypertension: Secondary | ICD-10-CM

## 2015-07-17 NOTE — Patient Instructions (Signed)
Return for a a follow up appointment with Dr. Oval Linsey in July  Your blood pressure today is 176/84   Check your blood pressure at home daily and keep record of the readings.  Take your BP meds as follows: continue with valsartan 320 mg once daily and carvedilol 25 mg twice daily.  Take all morning medications before coming to see Dr. Oval Linsey in July  Bring all of your meds, your BP cuff and your record of home blood pressures to your next appointment.  Exercise as you're able, try to walk approximately 30 minutes per day.  Keep salt intake to a minimum, especially watch canned and prepared boxed foods.  Eat more fresh fruits and vegetables and fewer canned items.  Avoid eating in fast food restaurants.    HOW TO TAKE YOUR BLOOD PRESSURE: . Rest 5 minutes before taking your blood pressure. .  Don't smoke or drink caffeinated beverages for at least 30 minutes before. . Take your blood pressure before (not after) you eat. . Sit comfortably with your back supported and both feet on the floor (don't cross your legs). . Elevate your arm to heart level on a table or a desk. . Use the proper sized cuff. It should fit smoothly and snugly around your bare upper arm. There should be enough room to slip a fingertip under the cuff. The bottom edge of the cuff should be 1 inch above the crease of the elbow. . Ideally, take 3 measurements at one sitting and record the average.

## 2015-07-18 NOTE — Assessment & Plan Note (Signed)
Today her pressure remains elevated in the office at 176/84.  Her home wrist cuff from [facility] read within 10 points.  I have asked that she keep track of her home BP readings on a log sheet and bring that in when she sees Dr. Oval Linsey in July.  It could be that she does have some white coat hypertension, but would like to see her actual home readings before making that determination.  Because she is easily confused about medications, if we need to add anything I would consider HCTZ added to her valsartan.  She does have a diagnosis of stage 4 kidney disease, but her last SCr was 0.99 with a CrCl of 60.3.  She was encouraged to take all of her normal morning medications on the day she comes to see Dr. Oval Linsey

## 2015-07-18 NOTE — Progress Notes (Signed)
07/18/2015 Lisa [name] [birth year] [id number]   HPI:  Lisa [name] is a 80 y.o. female patient of Lisa [name], with a [place] below who presents today for hypertension clinic evaluation.  Lisa [name] is originally from [place] and is here today with a Optometrist.  She is easily confused about her medications, in part because she doesn't read English and therefore has problems with changes made at medical visits.  When she saw Lisa. Oval [name] last month she was taking both metoprolol and carvedilol.  The metoprolol was discontinued.  She should also be taking valsartan, but she isn't sure what any of her medications are.  Because no one has made any changes to valsartan in a year or so, I am assuming that she still takes it every day.  She did not take her medications this morning.    Blood Pressure Goal:  140/90 (DM)  Current Medications: valsartan 320 mg qd, carvedilol 25 mg bid  Cardiac Hx: persistent AD, CHF (LVEF 45%), bicuspid aortic valve with mild AS. OSA, CKD IV and DM  Social Hx: no tobacco or alcohol, drinks 2 cups of coffee per day  Diet: eats at home, does not add salt when cooking  Exercise: tries to walk daily, unsure of how far/how long  Home BP readings: states home readings all [id number] systolic, with occasional increases to 150-160's.  Her home cuff shows readings ranging from 120-220, but she states the higher numbers belong to her husband.  Unsure of which readings belong to whom.  Intolerances: lisinopril caused cough  Wt Readings from Last 3 Encounters:  07/18/15 177 lb 6.4 oz (80.468 kg)  06/29/15 176 lb 12.8 oz (80.196 kg)  06/01/15 173 lb 3.2 oz (78.563 kg)   BP Readings from Last 3 Encounters:  07/18/15 176/84  06/29/15 188/81  06/01/15 186/80   Pulse Readings from Last 3 Encounters:  07/18/15 60  06/29/15 61  06/01/15 78    Current Outpatient Prescriptions  Medication Sig Dispense Refill  . B-D INS SYR ULTRAFINE .5CC/29G 29G X 1/2" 0.5 ML  MISC USE UTD TO INJECT INSULIN TWICE DAILY  6  . carvedilol (COREG) 25 MG tablet Take 1 tablet (25 mg total) by mouth 2 (two) times daily. 60 tablet 5  . digoxin (LANOXIN) 0.125 MG tablet Take 1 tablet (0.125 mg total) by mouth daily. 30 tablet 11  . furosemide (LASIX) 20 MG tablet Take 1 tablet (20 mg total) by mouth daily. 30 tablet 11  . insulin aspart (NOVOLOG) 100 UNIT/ML injection Inject 25 Units into the skin 3 (three) times daily before meals.    . insulin glargine (LANTUS) 100 UNIT/ML injection Inject 55 Units into the skin at bedtime.    . rivaroxaban (XARELTO) 20 MG TABS tablet Take 1 tablet (20 mg total) by mouth daily with supper. 30 tablet 11  . rosuvastatin (CRESTOR) 10 MG tablet Take 1 tablet (10 mg total) by mouth daily. 90 tablet 3  . valsartan (DIOVAN) 320 MG tablet Take 1 tablet (320 mg total) by mouth daily. 30 tablet 6   No current facility-administered medications for this visit.    Allergies  Allergen Reactions  . Lisinopril Cough    COUGH    Past Medical History  Diagnosis Date  . Chronic combined systolic and diastolic CHF (congestive heart failure) ([place])     a. 08/2014 Echo: EF 40-45%, no rwma, bicuspid AoV, mild AS, mod dil Asc Ao, PASP 26mmHg.  Marland Kitchen Anemia   . Essential hypertension   .  Diabetes mellitus without complication ([place])   . Vitamin D deficiency   . CKD (chronic kidney disease), stage III   . Carotid arterial disease ([place])   . Osteoarthritis of both ankles   . Mild aortic stenosis     a. 08/2014 Echo: mild AS, bicuspid AoV.  . OSA on CPAP   . Persistent atrial fibrillation ([place])     a. Dx 08/2013, CHA2DS2VASc=5-->Xarelto.    Blood pressure 176/84, pulse 60, height 5' (1.524 m), weight 177 lb 6.4 oz (80.468 kg).    Tommy Medal PharmD CPP [facility]

## 2015-07-24 ENCOUNTER — Other Ambulatory Visit: Payer: Self-pay

## 2015-07-24 MED ORDER — VALSARTAN 320 MG PO TABS: 320.0000 mg | ORAL_TABLET | Freq: Every day | ORAL | Status: DC

## 2015-08-02 ENCOUNTER — Ambulatory Visit (INDEPENDENT_AMBULATORY_CARE_PROVIDER_SITE_OTHER): Payer: Medicare Other | Admitting: Internal Medicine

## 2015-08-02 ENCOUNTER — Encounter: Payer: Self-pay | Admitting: Internal Medicine

## 2015-08-02 VITALS — BP 140/90 | HR 67 | Ht 61.0 in | Wt 177.0 lb

## 2015-08-02 DIAGNOSIS — Z794 Long term (current) use of insulin: Secondary | ICD-10-CM

## 2015-08-02 DIAGNOSIS — E109 Type 1 diabetes mellitus without complications: Secondary | ICD-10-CM

## 2015-08-02 DIAGNOSIS — E1059 Type 1 diabetes mellitus with other circulatory complications: Secondary | ICD-10-CM

## 2015-08-02 DIAGNOSIS — E1165 Type 2 diabetes mellitus with hyperglycemia: Secondary | ICD-10-CM | POA: Diagnosis not present

## 2015-08-02 DIAGNOSIS — IMO0002 Reserved for concepts with insufficient information to code with codable children: Secondary | ICD-10-CM

## 2015-08-02 DIAGNOSIS — E1159 Type 2 diabetes mellitus with other circulatory complications: Secondary | ICD-10-CM

## 2015-08-02 LAB — POCT GLYCOSYLATED HEMOGLOBIN (HGB A1C): Hemoglobin A1C: 12.2

## 2015-08-02 MED ORDER — INSULIN ASPART 100 UNIT/ML FLEXPEN: 15.0000 [IU] | PEN_INJECTOR | Freq: Three times a day (TID) | SUBCUTANEOUS | Status: DC

## 2015-08-02 MED ORDER — INSULIN GLARGINE 100 UNIT/ML SOLOSTAR PEN: 55.0000 [IU] | PEN_INJECTOR | Freq: Every day | SUBCUTANEOUS | Status: DC

## 2015-08-02 MED ORDER — ACCU-CHEK FASTCLIX LANCETS MISC: Status: DC

## 2015-08-02 MED ORDER — GLUCOSE BLOOD VI STRP: ORAL_STRIP | Status: DC

## 2015-08-02 MED ORDER — METFORMIN HCL 500 MG PO TABS: 500.0000 mg | ORAL_TABLET | Freq: Two times a day (BID) | ORAL | Status: DC

## 2015-08-02 MED ORDER — INSULIN PEN NEEDLE 32G X 4 MM MISC: Status: DC

## 2015-08-02 NOTE — Patient Instructions (Addendum)
Please continue Lantus at 55 units bedtime.  Take Novolog 10-15 min before each meal - 3x a day - 15 units before a smaller meal - 20 units before a regular meal - 25 units before  alarger meals  Please start Metformin 500 mg with breakfast and 500 mg with dinner. In 4 days, please increase to 1000 mg 2x a day: with breakfast and 1000 mg with dinner.  Please let me know if the sugars are consistently <80 or >200.  Please schedule an appt with Leonia Reader for diabetes education.  Please return in 1.5 months with your sugar log.   PATIENT INSTRUCTIONS FOR TYPE 2 DIABETES:  DIET AND EXERCISE Diet and exercise is an important part of diabetic treatment.  We recommended aerobic exercise in the form of brisk walking (working between 40-60% of maximal aerobic capacity, similar to brisk walking) for 150 minutes per week (such as 30 minutes five days per week) along with 3 times per week performing 'resistance' training (using various gauge rubber tubes with handles) 5-10 exercises involving the major muscle groups (upper body, lower body and core) performing 10-15 repetitions (or near fatigue) each exercise. Start at half the above goal but build slowly to reach the above goals. If limited by weight, joint pain, or disability, we recommend daily walking in a swimming pool with water up to waist to reduce pressure from joints while allow for adequate exercise.    BLOOD GLUCOSES Monitoring your blood glucoses is important for continued management of your diabetes. Please check your blood glucoses 2-4 times a day: fasting, before meals and at bedtime (you can rotate these measurements - e.g. one day check before the 3 meals, the next day check before 2 of the meals and before bedtime, etc.).   HYPOGLYCEMIA (low blood sugar) Hypoglycemia is usually a reaction to not eating, exercising, or taking too much insulin/ other diabetes drugs.  Symptoms include tremors, sweating, hunger, confusion,  headache, etc. Treat IMMEDIATELY with 15 grams of Carbs: . 4 glucose tablets .  cup regular juice/soda . 2 tablespoons raisins . 4 teaspoons sugar . 1 tablespoon honey Recheck blood glucose in 15 mins and repeat above if still symptomatic/blood glucose <100.  RECOMMENDATIONS TO REDUCE YOUR RISK OF DIABETIC COMPLICATIONS: * Take your prescribed MEDICATION(S) * Follow a DIABETIC diet: Complex carbs, fiber rich foods, (monounsaturated and polyunsaturated) fats * AVOID saturated/trans fats, high fat foods, >2,300 mg salt per day. * EXERCISE at least 5 times a week for 30 minutes or preferably daily.  * DO NOT SMOKE OR DRINK more than 1 drink a day. * Check your FEET every day. Do not wear tightfitting shoes. Contact us if you develop an ulcer * See your EYE doctor once a year or more if needed * Get a FLU shot once a year * Get a PNEUMONIA vaccine once before and once after age 106 years  GOALS:  * Your Hemoglobin A1c of <7%  * fasting sugars need to be <130 * after meals sugars need to be <180 (2h after you start eating) * Your Systolic BP should be [id number] or lower  * Your Diastolic BP should be 80 or lower  * Your HDL (Good Cholesterol) should be 40 or higher  * Your LDL (Bad Cholesterol) should be 100 or lower. * Your Triglycerides should be 150 or lower  * Your Urine microalbumin (kidney function) should be <30 * Your Body Mass Index should be 25 or lower    Please consider  the following ways to cut down carbs and fat and increase fiber and micronutrients in your diet: - substitute whole grain for white bread or pasta - substitute brown rice for white rice - substitute 90-calorie flat bread pieces for slices of bread when possible - substitute sweet potatoes or yams for white potatoes - substitute humus for margarine - substitute tofu for cheese when possible - substitute almond or rice milk for regular milk (would not drink soy milk daily due to concern for soy estrogen  influence on breast cancer risk) - substitute dark chocolate for other sweets when possible - substitute water - can add lemon or orange slices for taste - for diet sodas (artificial sweeteners will trick your body that you can eat sweets without getting calories and will lead you to overeating and weight gain in the long run) - do not skip breakfast or other meals (this will slow down the metabolism and will result in more weight gain over time)  - can try smoothies made from fruit and almond/rice milk in am instead of regular breakfast - can also try old-fashioned (not instant) oatmeal made with almond/rice milk in am - order the dressing on the side when eating salad at a restaurant (pour less than half of the dressing on the salad) - eat as little meat as possible - can try juicing, but should not forget that juicing will get rid of the fiber, so would alternate with eating raw veg./fruits or drinking smoothies - use as little oil as possible, even when using olive oil - can dress a salad with a mix of balsamic vinegar and lemon juice, for e.g. - use agave nectar, stevia sugar, or regular sugar rather than artificial sweateners - steam or broil/roast veggies  - snack on veggies/fruit/nuts (unsalted, preferably) when possible, rather than processed foods - reduce or eliminate aspartame in diet (it is in diet sodas, chewing gum, etc) Read the labels!  Try to read Dr. Janene Harvey book: "Program for Reversing Diabetes" for other ideas for healthy eating.

## 2015-08-02 NOTE — Progress Notes (Signed)
Patient ID: Lisa [name], female   DOB: 1935-08-22, 80 y.o.   MRN: [id number]  HPI: Lisa [name] is a 80 y.o.-year-old [place] female, referred by her cardiologist, Dr. Oval Linsey, for management of DM2, dx in ~2001, insulin-dependent since 2014, uncontrolled, with complications (CHF - systolic and diastolic, CKD stage III). She is here accompanied by her husband and the [place] interpreter who translates for [place].  Patient tells me that she has been using insulin injections for her diabetes since 2014, and yesterday, after am insulin injection >> CBG 19 (lowest CBG she ever had) >> almost blacked out >> went to another room (!) and took honey >> 134.  Last hemoglobin A1c was: Lab Results  Component Value Date   HGBA1C 8.8* 08/23/2014   Pt is on a regimen of: - Lantus 55 units at bedtime (vials) - Novolog 25 units x a day at 9 am and 4 pm  (vials). She eats b'fast at 10:30 am and 4-5 pm. She and her husband have pbs with the syringes >> hands shaking.  Pt checks her sugars 3-4x a day and they are: - am: 200-300, 497 (highest) - 2h after b'fast: checks if feels low: 19x1, 40-70s - before lunch: 200s-300 - 2h after lunch: n/c - before dinner (5 pm): 200-300s - 2h after dinner: n/c - bedtime: 180-200s - nighttime: n/c + lows. Lowest sugar was 19x1, 40-70s, always in am after first NovoLog dose; she has hypoglycemia awareness at 70-80.  Highest sugar was 500s.  Glucometer: ?  Pt's meals are: - Breakfast: salad, 2 boiled eggs, sour cream; fruit: apples, banana, strawberries - Lunch: polenta + beans + cabbage + potatoes + chicken  - Dinner - small:  bread + sour cream + leftover for lunch, fruit - Snacks: fruit: banana or pear Occasionally juice.  - + CKD, last BUN/creatinine:  Lab Results  Component Value Date   BUN 27* 06/29/2015   BUN 22* 08/27/2014   CREATININE 0.99* 06/29/2015   CREATININE 0.97 08/27/2014  On Diovan.  - last set of lipids: Lab Results  Component Value  Date   CHOL 128 08/24/2014   HDL 30* 08/24/2014   LDLCALC 83 08/24/2014   TRIG 77 08/24/2014   CHOLHDL 4.3 08/24/2014  On Crestor 10. - No previous eye exams!!! - no numbness and tingling in her feet.  Pt has FH of DM in M.  ROS: Constitutional: + Fatigue, weight loss, increased appetite Eyes: no blurry vision, no xerophthalmia ENT: no sore throat, no nodules palpated in throat, no dysphagia/odynophagia, no hoarseness, + decreased hearing, + tinnitus Cardiovascular: + CP/+ SOB/no palpitations/+ leg swelling Respiratory: + cough/+ SOB Gastrointestinal: no N/V/D/C Musculoskeletal: no muscle/+ joint aches Skin: no rashes Neurological: no tremors/numbness/tingling/dizziness  Past Medical History  Diagnosis Date  . Chronic combined systolic and diastolic CHF (congestive heart failure) ([place])     a. 08/2014 Echo: EF 40-45%, no rwma, bicuspid AoV, mild AS, mod dil Asc Ao, PASP 34mmHg.  Marland Kitchen Anemia   . Essential hypertension   . Diabetes mellitus without complication ([place])   . Vitamin D deficiency   . CKD (chronic kidney disease), stage III   . Carotid arterial disease ([place])   . Osteoarthritis of both ankles   . Mild aortic stenosis     a. 08/2014 Echo: mild AS, bicuspid AoV.  . OSA on CPAP   . Persistent atrial fibrillation ([place])     a. Dx 08/2013, CHA2DS2VASc=5-->Xarelto.   Past Surgical History  Procedure Laterality Date  . Cholecystectomy  Social History   Social History  . Marital Status: Married    Spouse Name: N/A  . Number of Children: 3   Occupational History  . Housewife    Social History Main Topics  . Smoking status: Former Research scientist (life sciences)  . Smokeless tobacco: Never Used  . Alcohol Use: No  . Drug Use: No   Current Outpatient Prescriptions on File Prior to Visit  Medication Sig Dispense Refill  . B-D INS SYR ULTRAFINE .5CC/29G 29G X 1/2" 0.5 ML MISC USE UTD TO INJECT INSULIN TWICE DAILY  6  . carvedilol (COREG) 25 MG tablet Take 1 tablet (25 mg total) by mouth 2  (two) times daily. 60 tablet 5  . digoxin (LANOXIN) 0.125 MG tablet Take 1 tablet (0.125 mg total) by mouth daily. 30 tablet 11  . furosemide (LASIX) 20 MG tablet Take 1 tablet (20 mg total) by mouth daily. 30 tablet 11  . insulin aspart (NOVOLOG) 100 UNIT/ML injection Inject 25 Units into the skin 3 (three) times daily before meals.    . insulin glargine (LANTUS) 100 UNIT/ML injection Inject 55 Units into the skin at bedtime.    . rivaroxaban (XARELTO) 20 MG TABS tablet Take 1 tablet (20 mg total) by mouth daily with supper. 30 tablet 11  . rosuvastatin (CRESTOR) 10 MG tablet Take 1 tablet (10 mg total) by mouth daily. 90 tablet 3  . valsartan (DIOVAN) 320 MG tablet Take 1 tablet (320 mg total) by mouth daily. 30 tablet 6   No current facility-administered medications on file prior to visit.   Allergies  Allergen Reactions  . Lisinopril Cough    COUGH   Family History  Problem Relation Age of Onset  . Heart attack Neg Hx   . Stroke Sister   . Diabetes Mother   . Hyperlipidemia Mother   . Hypertension Mother    PE: BP 140/90 mmHg  Pulse 67  Ht 5\' 1"  (1.549 m)  Wt 177 lb (80.287 kg)  BMI 33.46 kg/m2  SpO2 94% Wt Readings from Last 3 Encounters:  08/02/15 177 lb (80.287 kg)  07/18/15 177 lb 6.4 oz (80.468 kg)  06/29/15 176 lb 12.8 oz (80.196 kg)   Constitutional: obese, in NAD Eyes: PERRLA, EOMI, no exophthalmos ENT: moist mucous membranes, no thyromegaly, no cervical lymphadenopathy Cardiovascular: RRR, No MRG, + B nonpitting edema Respiratory: CTA B Gastrointestinal: abdomen soft, NT, ND, BS+ Musculoskeletal: no deformities, strength intact in all 4 Skin: moist, warm, no rashes Neurological: no tremor with outstretched hands, DTR normal in all 4  ASSESSMENT: 1. DM2, insulin-dependent, uncontrolled, with complications - Systolic and diastolic CHF - CKD stage III  PLAN:  1. Patient with long-standing, uncontrolled diabetes, on basal-bolus insulin regimen, which  became insufficient. Today, her HbA1c was 12.2%. She is taking large doses of NovoLog without a clear relationship to the meal. She usually takes her NovoLog at 9 AM and eats at 10:30 AM (!). Due to this temporal mismatch, she had a severe low yesterday, at 85. She rates her sugars appropriately with honey and the next reading was much improved. - Through the interpreter, we discussed about designing a new, more flexible, basal-bolus insulin regimen for her and also to switch to insulin pens which should be covered by her insurance and are much easier to inject. I advised her to use the abdomen for injection, as now they are using the arms (her husband is giving her the injections). This is ok especially for NovoLog, but preferentially they would  use the abdomen.  - We'll also decrease the NovoLog to avoid further hypoglycemic episodes, but I advised her to take it with all 3 of her meals (now taking it with only 2) - will try to add Metformin to improve her insulin resistance  - patient is very interested in a pump although I have doubt that she can manage it. I will refer her to the diabetes educator for a discussion about pumps enough that this will need a carb counting tutorial. Again, I have doubt that she can do this, especially since she is not [place] speaking and not much [place] with her medical care possibly because of this reason, but will give her the opportunity. - I suggested to:  Patient Instructions  Please continue Lantus at 55 units bedtime.  Take Novolog 10-15 min before each meal - 3x a day - 15 units before a smaller meal - 20 units before a regular meal - 25 units before  alarger meals  Please start Metformin 500 mg with breakfast and 500 mg with dinner. In 4 days, please increase to 1000 mg 2x a day: with breakfast and 1000 mg with dinner.  Please let me know if the sugars are consistently <80 or >200.  Please schedule an appt with Leonia Reader for diabetes  education.  Please return in 1.5 months with your sugar log.   - continue checking sugars at different times of the day - check 3-4 times a day, rotating checks - we gave her Accu-Chek guide meter and demonstrated use - given sugar log and advised how to fill it and to bring it at next appt  - given foot care handout and explained the principles  - given instructions for hypoglycemia management "15-15 rule"  - advised for yearly eye exams >>  she needs an appointment !!! - Return to clinic in 1.5 mo with sugar log   - time spent with the patient: 1 hour, of which >50% was spent in obtaining information about her symptoms, reviewing her previous labs, evaluations, and treatments, counseling her about her condition (please see the discussed topics above), and developing a plan to further investigate it; she had a number of questions which I addressed.   Philemon Kingdom, MD PhD [facility] Endocrinology

## 2015-08-06 ENCOUNTER — Other Ambulatory Visit: Payer: Self-pay

## 2015-08-06 MED ORDER — DIGOXIN 125 MCG PO TABS
0.1250 mg | ORAL_TABLET | Freq: Every day | ORAL | Status: DC
Start: 2015-08-06 — End: 2015-09-25

## 2015-08-06 MED ORDER — FUROSEMIDE 20 MG PO TABS: 20.0000 mg | ORAL_TABLET | Freq: Every day | ORAL | Status: DC

## 2015-08-15 ENCOUNTER — Encounter: Payer: Medicare Other | Admitting: Nutrition

## 2015-08-29 ENCOUNTER — Ambulatory Visit: Payer: Medicare Other | Admitting: Cardiovascular Disease

## 2015-09-14 ENCOUNTER — Encounter: Payer: Self-pay | Admitting: Internal Medicine

## 2015-09-14 ENCOUNTER — Ambulatory Visit (INDEPENDENT_AMBULATORY_CARE_PROVIDER_SITE_OTHER): Payer: Medicare Other | Admitting: Internal Medicine

## 2015-09-14 VITALS — BP 112/72 | HR 64 | Wt 176.0 lb

## 2015-09-14 DIAGNOSIS — Z794 Long term (current) use of insulin: Secondary | ICD-10-CM | POA: Diagnosis not present

## 2015-09-14 DIAGNOSIS — E1165 Type 2 diabetes mellitus with hyperglycemia: Secondary | ICD-10-CM | POA: Diagnosis not present

## 2015-09-14 DIAGNOSIS — IMO0002 Reserved for concepts with insufficient information to code with codable children: Secondary | ICD-10-CM

## 2015-09-14 DIAGNOSIS — E1159 Type 2 diabetes mellitus with other circulatory complications: Secondary | ICD-10-CM | POA: Diagnosis not present

## 2015-09-14 NOTE — Progress Notes (Signed)
Patient ID: Lisa [name], female   DOB: January 23, 1936, 80 y.o.   MRN: [id number]  HPI: Lisa [name] is a 80 y.o.-year-old [place] female, initially referred by her cardiologist, Dr. Oval Linsey, for management of DM2, dx in ~2001, insulin-dependent since 2014, uncontrolled, with complications (CHF - systolic and diastolic, CKD stage III). She is here accompanied by her husband and the [place] interpreter who translates for [place]. Last visit 1.5 months ago.  Since last visit, she had an AMI >> was hospitalized at [facility] >> discharged med list does not include Metformin. Lantus was decreased to 30 units at bedtime 2/2 lows while in the hospital.  No CP now, but has some AP.  Yesterday am, CBG before lunch was 38. AM CBG was 127.  She still takes Novolog after she eats despite advise to move this before the meal at last visit!!  Last hemoglobin A1c was: Lab Results  Component Value Date   HGBA1C 12.2 08/02/2015   HGBA1C 8.8 (H) 08/23/2014   Pt was on a regimen of: - Lantus 55 units at bedtime (vials) - Novolog 25 units x a day at 9 am and 4 pm  (vials). She eats b'fast at 10:30 am and 4-5 pm. She and her husband have pbs with the syringes >> hands shaking.  At last visit we changed to: - Lantus 55 >> 30 units bedtime. - Novolog 10-15 min before each meal - 3x a day - if sugars <100, she takes 10 units, if >100, she takes 20 units  - Metformin 500 mg 2x a day  Pt checks her sugars 3-4x a day and they are much better - + lows (as mentioned above): - am: 200-300, 497 (highest) >> 117-228 - 2h after b'fast: checks if feels low: 19x1, 40-70s >> n/c - before lunch: 200s-300 >> 38x1, 96-123 - 2h after lunch: n/c - before dinner (5 pm): 200-300s >> n/c - 2h after dinner: n/c - bedtime: 180-200s >> 127-193, 225 - nighttime: n/c >> 58x1, 122-269, 300 + lows. Lowest sugar was 19x1 ( after am insulin injection >> CBG 19 in 07/2015 (lowest CBG she ever had >> almost blacked out >> went  to another room (!) and took honey >> 134) >> now 58!; she has hypoglycemia awareness at 70-80.  Highest sugar was 500s >> 260s.  Glucometer: Accu-Chek guide  Pt's meals are: - Breakfast: salad, 2 boiled eggs, sour cream; fruit: apples, banana, strawberries - Lunch: polenta + beans + cabbage + potatoes + chicken  - Dinner - small:  bread + sour cream + leftover for lunch, fruit - Snacks: fruit: banana or pear Occasionally juice.  - + CKD, last BUN/creatinine:  Lab Results  Component Value Date   BUN 27 (H) 06/29/2015   BUN 22 (H) 08/27/2014   CREATININE 0.99 (H) 06/29/2015   CREATININE 0.97 08/27/2014  On Diovan.  - last set of lipids: Lab Results  Component Value Date   CHOL 128 08/24/2014   HDL 30 (L) 08/24/2014   LDLCALC 83 08/24/2014   TRIG 77 08/24/2014   CHOLHDL 4.3 08/24/2014  On Crestor 10. - No previous eye exams!!! - no numbness and tingling in her feet.  ROS: Constitutional: + Fatigue, no weight gain/loss Eyes: no blurry vision, no xerophthalmia ENT: no sore throat, no nodules palpated in throat, no dysphagia/odynophagia, no hoarseness Cardiovascular: no CP/+ SOB/no palpitations/+ leg swelling Respiratory: no cough/+ SOB Gastrointestinal: no N/V/D/C Musculoskeletal: no muscle/joint aches Skin: no rashes Neurological: no tremors/numbness/tingling/dizziness  I reviewed  pt's medications, allergies, PMH, social hx, family hx, and changes were documented in the history of present illness. Otherwise, unchanged from my initial visit note.  Past Medical History:  Diagnosis Date  . Anemia   . Carotid arterial disease ([place])   . Chronic combined systolic and diastolic CHF (congestive heart failure) ([place])    a. 08/2014 Echo: EF 40-45%, no rwma, bicuspid AoV, mild AS, mod dil Asc Ao, PASP 69mmHg.  . CKD (chronic kidney disease), stage III   . Diabetes mellitus without complication ([place])   . Essential hypertension   . Mild aortic stenosis    a. 08/2014 Echo: mild AS,  bicuspid AoV.  . OSA on CPAP   . Osteoarthritis of both ankles   . Persistent atrial fibrillation ([place])    a. Dx 08/2013, CHA2DS2VASc=5-->Xarelto.  . Vitamin D deficiency    Past Surgical History:  Procedure Laterality Date  . CHOLECYSTECTOMY     Social History   Social History  . Marital Status: Married    Spouse Name: N/A  . Number of Children: 3   Occupational History  . Housewife    Social History Main Topics  . Smoking status: Former Research scientist (life sciences)  . Smokeless tobacco: Never Used  . Alcohol Use: No  . Drug Use: No   Current Outpatient Prescriptions on File Prior to Visit  Medication Sig Dispense Refill  . ACCU-CHEK FASTCLIX LANCETS MISC Use 4x  day 200 each 4  . B-D INS SYR ULTRAFINE .5CC/29G 29G X 1/2" 0.5 ML MISC USE UTD TO INJECT INSULIN TWICE DAILY  6  . carvedilol (COREG) 25 MG tablet Take 1 tablet (25 mg total) by mouth 2 (two) times daily. 60 tablet 5  . digoxin (LANOXIN) 0.125 MG tablet Take 1 tablet (0.125 mg total) by mouth daily. 30 tablet 6  . furosemide (LASIX) 20 MG tablet Take 1 tablet (20 mg total) by mouth daily. 30 tablet 6  . glucose blood (ACCU-CHEK GUIDE) test strip Use as instructed 100 each 12  . insulin aspart (NOVOLOG) 100 UNIT/ML FlexPen Inject 15-25 Units into the skin 3 (three) times daily with meals. 15 mL 2  . Insulin Glargine (LANTUS) 100 UNIT/ML Solostar Pen Inject 55 Units into the skin daily at 10 pm. 15 mL 2  . Insulin Pen Needle (CARETOUCH PEN NEEDLES) 32G X 4 MM MISC Use 4x a day 200 each 4  . metFORMIN (GLUCOPHAGE) 500 MG tablet Take 1 tablet (500 mg total) by mouth 2 (two) times daily with a meal. 180 tablet 3  . rivaroxaban (XARELTO) 20 MG TABS tablet Take 1 tablet (20 mg total) by mouth daily with supper. 30 tablet 11  . rosuvastatin (CRESTOR) 10 MG tablet Take 1 tablet (10 mg total) by mouth daily. 90 tablet 3  . valsartan (DIOVAN) 320 MG tablet Take 1 tablet (320 mg total) by mouth daily. 30 tablet 6   No current facility-administered  medications on file prior to visit.    Allergies  Allergen Reactions  . Lisinopril Cough    COUGH   Family History  Problem Relation Age of Onset  . Heart attack Neg Hx   . Stroke Sister   . Diabetes Mother   . Hyperlipidemia Mother   . Hypertension Mother    PE: BP 112/72 (BP Location: Left Arm, Patient Position: Sitting)   Pulse 64   Wt 176 lb (79.8 kg)   SpO2 93%   BMI 33.25 kg/m  Wt Readings from Last 3 Encounters:  09/14/15 176  lb (79.8 kg)  08/02/15 177 lb (80.3 kg)  07/18/15 177 lb 6.4 oz (80.5 kg)   Constitutional: obese, in NAD, In wheelchair Eyes: PERRLA, EOMI, no exophthalmos ENT: moist mucous membranes, no thyromegaly, no cervical lymphadenopathy Cardiovascular: RRR, No MRG, + B nonpitting edema Respiratory: CTA B Gastrointestinal: abdomen soft, NT, ND, BS+ Musculoskeletal: no deformities, strength intact in all 4 Skin: moist, warm, no rashes Neurological: no tremor with outstretched hands, DTR normal in all 4  ASSESSMENT: 1. DM2, insulin-dependent, uncontrolled, with complications - Systolic and diastolic CHF - CKD stage III  PLAN:  1. Patient with long-standing, uncontrolled diabetes, on basal-bolus insulin regimen, with poor control (HbA1c was 12.2% at last visit).  - Before last visit, she was taking large doses of NovoLog without a clear relationship to the meal.  Subsequently, she had a low blood sugar, of 19 after she took the morning insulin without eating. I strongly advised her to take the NovoLog 15 minutes before meals and we also adjusted the NovoLog doses at last visit. She is still taking the Novolog after meals and takes a different dose than suggested.  - We added metformin, also, to improve her insulin resistance. She is now on half-max dose Metformin (did not go up to 1000 mg 2x a day) >> will continue for now as she has some nondescript AP.  - Her Lantus dose was decreased b/c lows >> will continue 30 units daily at bedtime. She had a low  CBG at night (58) and one before lunch (38) but I think these were caused by her taking Novolog after meals. We will move this before meals and decrease the dose.  - patient was very interested in a pump although I had doubts that she can manage it. I refered her to the diabetes educator for a discussion about pumps. They missed the appt >> advised to rescheduled. - I suggested to:  Patient Instructions  Please continue: - Metformin 500 mg 2x a day - Lantus 30 units at bedtime - Novolog 10-15 min before each meal - 3x a day - 10 units before a smaller meal - 15 units before a regular meal If sugars 80-100 before a meal, take only 8 units If sugars 60-80 before a meal, take only 6 units If sugars <60 before a meal, do not take any insulin with that meal  Please return in 1.5 months with your sugar log.   - continue checking sugars at different times of the day - check 3-4 times a day, rotating checks - advised for yearly eye exams >>  she needs an appointment ! - Return to clinic in 1.5 mo with sugar log   - time spent with the patient, her husband and the interpreter: 40 min, of which >50% was spent in reviewing her sugars, reviewing discharge info from [facility], reviewing and adjusting her insulin regimen and corrected her insulin administration to avoid further hypo and hyperglycemia    Philemon Kingdom, MD PhD [facility] Endocrinology

## 2015-09-14 NOTE — Patient Instructions (Addendum)
Please continue: - Metformin 500 mg 2x a day - Lantus 30 units at bedtime - Novolog 10-15 min before each meal - 3x a day - 10 units before a smaller meal - 15 units before a regular meal If sugars 80-100 before a meal, take only 8 units If sugars 60-80 before a meal, take only 6 units If sugars <60 before a meal, do not take any insulin with that meal  Please return in 1.5 months with your sugar log.

## 2015-09-17 ENCOUNTER — Inpatient Hospital Stay (HOSPITAL_COMMUNITY)
Admission: EM | Admit: 2015-09-17 | Discharge: 2015-09-25 | DRG: 291 | Disposition: A | Discharge status: Home Health | Payer: Medicare Other | Attending: Internal Medicine | Admitting: Internal Medicine

## 2015-09-17 ENCOUNTER — Emergency Department (HOSPITAL_COMMUNITY): Payer: Medicare Other

## 2015-09-17 ENCOUNTER — Ambulatory Visit (INDEPENDENT_AMBULATORY_CARE_PROVIDER_SITE_OTHER): Payer: Medicare Other | Admitting: Cardiovascular Disease

## 2015-09-17 ENCOUNTER — Encounter: Payer: Self-pay | Admitting: Cardiovascular Disease

## 2015-09-17 ENCOUNTER — Encounter (HOSPITAL_COMMUNITY): Payer: Self-pay | Admitting: Emergency Medicine

## 2015-09-17 VITALS — BP 108/64 | HR 59 | Ht 61.0 in | Wt 173.0 lb

## 2015-09-17 DIAGNOSIS — R778 Other specified abnormalities of plasma proteins: Secondary | ICD-10-CM | POA: Diagnosis present

## 2015-09-17 DIAGNOSIS — Z7984 Long term (current) use of oral hypoglycemic drugs: Secondary | ICD-10-CM

## 2015-09-17 DIAGNOSIS — E1159 Type 2 diabetes mellitus with other circulatory complications: Secondary | ICD-10-CM | POA: Diagnosis present

## 2015-09-17 DIAGNOSIS — E11649 Type 2 diabetes mellitus with hypoglycemia without coma: Secondary | ICD-10-CM | POA: Diagnosis present

## 2015-09-17 DIAGNOSIS — I10 Essential (primary) hypertension: Secondary | ICD-10-CM

## 2015-09-17 DIAGNOSIS — Z888 Allergy status to other drugs, medicaments and biological substances status: Secondary | ICD-10-CM

## 2015-09-17 DIAGNOSIS — I509 Heart failure, unspecified: Secondary | ICD-10-CM | POA: Diagnosis not present

## 2015-09-17 DIAGNOSIS — R0789 Other chest pain: Secondary | ICD-10-CM | POA: Diagnosis not present

## 2015-09-17 DIAGNOSIS — I2510 Atherosclerotic heart disease of native coronary artery without angina pectoris: Secondary | ICD-10-CM | POA: Diagnosis present

## 2015-09-17 DIAGNOSIS — K769 Liver disease, unspecified: Secondary | ICD-10-CM | POA: Diagnosis present

## 2015-09-17 DIAGNOSIS — R195 Other fecal abnormalities: Secondary | ICD-10-CM | POA: Diagnosis present

## 2015-09-17 DIAGNOSIS — Z8249 Family history of ischemic heart disease and other diseases of the circulatory system: Secondary | ICD-10-CM

## 2015-09-17 DIAGNOSIS — I4819 Other persistent atrial fibrillation: Secondary | ICD-10-CM

## 2015-09-17 DIAGNOSIS — I455 Other specified heart block: Secondary | ICD-10-CM | POA: Diagnosis present

## 2015-09-17 DIAGNOSIS — R079 Chest pain, unspecified: Secondary | ICD-10-CM | POA: Diagnosis not present

## 2015-09-17 DIAGNOSIS — Z794 Long term (current) use of insulin: Secondary | ICD-10-CM

## 2015-09-17 DIAGNOSIS — I5042 Chronic combined systolic (congestive) and diastolic (congestive) heart failure: Secondary | ICD-10-CM

## 2015-09-17 DIAGNOSIS — N184 Chronic kidney disease, stage 4 (severe): Secondary | ICD-10-CM | POA: Diagnosis present

## 2015-09-17 DIAGNOSIS — I252 Old myocardial infarction: Secondary | ICD-10-CM | POA: Diagnosis not present

## 2015-09-17 DIAGNOSIS — N179 Acute kidney failure, unspecified: Secondary | ICD-10-CM | POA: Diagnosis present

## 2015-09-17 DIAGNOSIS — Z87891 Personal history of nicotine dependence: Secondary | ICD-10-CM

## 2015-09-17 DIAGNOSIS — I350 Nonrheumatic aortic (valve) stenosis: Secondary | ICD-10-CM | POA: Diagnosis present

## 2015-09-17 DIAGNOSIS — I248 Other forms of acute ischemic heart disease: Secondary | ICD-10-CM | POA: Diagnosis present

## 2015-09-17 DIAGNOSIS — E1165 Type 2 diabetes mellitus with hyperglycemia: Secondary | ICD-10-CM | POA: Diagnosis present

## 2015-09-17 DIAGNOSIS — R1013 Epigastric pain: Secondary | ICD-10-CM | POA: Diagnosis present

## 2015-09-17 DIAGNOSIS — G4733 Obstructive sleep apnea (adult) (pediatric): Secondary | ICD-10-CM

## 2015-09-17 DIAGNOSIS — R0602 Shortness of breath: Secondary | ICD-10-CM | POA: Diagnosis not present

## 2015-09-17 DIAGNOSIS — I481 Persistent atrial fibrillation: Secondary | ICD-10-CM | POA: Diagnosis present

## 2015-09-17 DIAGNOSIS — E876 Hypokalemia: Secondary | ICD-10-CM | POA: Diagnosis present

## 2015-09-17 DIAGNOSIS — E559 Vitamin D deficiency, unspecified: Secondary | ICD-10-CM | POA: Diagnosis present

## 2015-09-17 DIAGNOSIS — R7989 Other specified abnormal findings of blood chemistry: Secondary | ICD-10-CM | POA: Diagnosis not present

## 2015-09-17 DIAGNOSIS — I130 Hypertensive heart and chronic kidney disease with heart failure and stage 1 through stage 4 chronic kidney disease, or unspecified chronic kidney disease: Secondary | ICD-10-CM | POA: Diagnosis present

## 2015-09-17 DIAGNOSIS — Z79899 Other long term (current) drug therapy: Secondary | ICD-10-CM

## 2015-09-17 DIAGNOSIS — K5900 Constipation, unspecified: Secondary | ICD-10-CM | POA: Diagnosis not present

## 2015-09-17 DIAGNOSIS — I5043 Acute on chronic combined systolic (congestive) and diastolic (congestive) heart failure: Secondary | ICD-10-CM | POA: Diagnosis present

## 2015-09-17 DIAGNOSIS — IMO0002 Reserved for concepts with insufficient information to code with codable children: Secondary | ICD-10-CM

## 2015-09-17 DIAGNOSIS — H9190 Unspecified hearing loss, unspecified ear: Secondary | ICD-10-CM | POA: Diagnosis present

## 2015-09-17 DIAGNOSIS — Z7901 Long term (current) use of anticoagulants: Secondary | ICD-10-CM

## 2015-09-17 DIAGNOSIS — I255 Ischemic cardiomyopathy: Secondary | ICD-10-CM | POA: Diagnosis not present

## 2015-09-17 DIAGNOSIS — D72829 Elevated white blood cell count, unspecified: Secondary | ICD-10-CM | POA: Diagnosis present

## 2015-09-17 DIAGNOSIS — E1122 Type 2 diabetes mellitus with diabetic chronic kidney disease: Secondary | ICD-10-CM | POA: Diagnosis present

## 2015-09-17 DIAGNOSIS — I4510 Unspecified right bundle-branch block: Secondary | ICD-10-CM | POA: Diagnosis present

## 2015-09-17 DIAGNOSIS — R109 Unspecified abdominal pain: Secondary | ICD-10-CM | POA: Diagnosis present

## 2015-09-17 DIAGNOSIS — Z833 Family history of diabetes mellitus: Secondary | ICD-10-CM

## 2015-09-17 DIAGNOSIS — Z9989 Dependence on other enabling machines and devices: Secondary | ICD-10-CM

## 2015-09-17 LAB — CBG MONITORING, ED
GLUCOSE-CAPILLARY: 140 mg/dL — AB (ref 65–99)
Glucose-Capillary: 62 mg/dL — ABNORMAL LOW (ref 65–99)

## 2015-09-17 LAB — URINALYSIS, ROUTINE W REFLEX MICROSCOPIC
BILIRUBIN URINE: NEGATIVE
GLUCOSE, UA: NEGATIVE mg/dL
HGB URINE DIPSTICK: NEGATIVE
Ketones, ur: NEGATIVE mg/dL
Leukocytes, UA: NEGATIVE
Nitrite: NEGATIVE
PROTEIN: NEGATIVE mg/dL
Specific Gravity, Urine: 1.008 (ref 1.005–1.030)
pH: 6 (ref 5.0–8.0)

## 2015-09-17 LAB — COMPREHENSIVE METABOLIC PANEL
ALT: 23 U/L (ref 14–54)
AST: 28 U/L (ref 15–41)
Albumin: 3.6 g/dL (ref 3.5–5.0)
Alkaline Phosphatase: 85 U/L (ref 38–126)
Anion gap: 8 (ref 5–15)
BUN: 48 mg/dL — ABNORMAL HIGH (ref 6–20)
CHLORIDE: 105 mmol/L (ref 101–111)
CO2: 26 mmol/L (ref 22–32)
CREATININE: 1.76 mg/dL — AB (ref 0.44–1.00)
Calcium: 8.8 mg/dL — ABNORMAL LOW (ref 8.9–10.3)
GFR calc Af Amer: 31 mL/min — ABNORMAL LOW (ref >60)
GFR calc non Af Amer: 27 mL/min — ABNORMAL LOW (ref >60)
GLUCOSE: 71 mg/dL (ref 65–99)
Potassium: 4.4 mmol/L (ref 3.5–5.1)
SODIUM: 139 mmol/L (ref 135–145)
Total Bilirubin: 1 mg/dL (ref 0.3–1.2)
Total Protein: 7.1 g/dL (ref 6.5–8.1)

## 2015-09-17 LAB — CBC
HCT: 35.1 % — ABNORMAL LOW (ref 36.0–46.0)
Hemoglobin: 11.4 g/dL — ABNORMAL LOW (ref 12.0–15.0)
MCH: 26.9 pg (ref 26.0–34.0)
MCHC: 32.5 g/dL (ref 30.0–36.0)
MCV: 82.8 fL (ref 78.0–100.0)
PLATELETS: 279 10*3/uL (ref 150–400)
RBC: 4.24 MIL/uL (ref 3.87–5.11)
RDW: 14.9 % (ref 11.5–15.5)
WBC: 15.6 10*3/uL — ABNORMAL HIGH (ref 4.0–10.5)

## 2015-09-17 LAB — GLUCOSE, CAPILLARY: GLUCOSE-CAPILLARY: 176 mg/dL — AB (ref 65–99)

## 2015-09-17 LAB — SODIUM, URINE, RANDOM: SODIUM UR: 90 mmol/L

## 2015-09-17 LAB — TROPONIN I
TROPONIN I: 0.12 ng/mL — AB (ref <0.03)
Troponin I: 0.09 ng/mL (ref <0.03)

## 2015-09-17 LAB — LIPASE, BLOOD: LIPASE: 18 U/L (ref 11–51)

## 2015-09-17 LAB — CREATININE, URINE, RANDOM: CREATININE, URINE: 41.99 mg/dL

## 2015-09-17 LAB — BRAIN NATRIURETIC PEPTIDE: B NATRIURETIC PEPTIDE 5: 1492.9 pg/mL — AB (ref 0.0–100.0)

## 2015-09-17 MED ORDER — ACETAMINOPHEN 325 MG PO TABS
650.0000 mg | ORAL_TABLET | ORAL | Status: DC | PRN
  Administered 2015-09-17 – 2015-09-24 (×9): 650 mg via ORAL
  Filled 2015-09-17 (×10): qty 2

## 2015-09-17 MED ORDER — ONDANSETRON HCL 4 MG/2ML IJ SOLN: 4.0000 mg | Freq: Four times a day (QID) | INTRAMUSCULAR | Status: DC | PRN

## 2015-09-17 MED ORDER — ROSUVASTATIN CALCIUM 10 MG PO TABS
10.0000 mg | ORAL_TABLET | Freq: Every day | ORAL | Status: DC
  Administered 2015-09-18 – 2015-09-25 (×8): 10 mg via ORAL
  Filled 2015-09-17 (×8): qty 1

## 2015-09-17 MED ORDER — INSULIN GLARGINE 100 UNIT/ML ~~LOC~~ SOLN
15.0000 [IU] | Freq: Every day | SUBCUTANEOUS | Status: DC
  Administered 2015-09-17 – 2015-09-19 (×3): 15 [IU] via SUBCUTANEOUS
  Filled 2015-09-17 (×3): qty 0.15

## 2015-09-17 MED ORDER — DIGOXIN 125 MCG PO TABS
0.1250 mg | ORAL_TABLET | Freq: Every day | ORAL | Status: DC
  Administered 2015-09-18: 0.125 mg via ORAL
  Filled 2015-09-17: qty 1

## 2015-09-17 MED ORDER — INSULIN ASPART 100 UNIT/ML ~~LOC~~ SOLN
0.0000 [IU] | SUBCUTANEOUS | Status: DC
  Administered 2015-09-18: 2 [IU] via SUBCUTANEOUS
  Administered 2015-09-18: 7 [IU] via SUBCUTANEOUS

## 2015-09-17 MED ORDER — ZOLPIDEM TARTRATE 5 MG PO TABS
5.0000 mg | ORAL_TABLET | Freq: Every evening | ORAL | Status: DC | PRN
  Administered 2015-09-17 – 2015-09-22 (×5): 5 mg via ORAL
  Filled 2015-09-17 (×5): qty 1

## 2015-09-17 MED ORDER — SODIUM CHLORIDE 0.9 % IV SOLN: 250.0000 mL | INTRAVENOUS | Status: DC | PRN

## 2015-09-17 MED ORDER — SODIUM CHLORIDE 0.9% FLUSH: 3.0000 mL | INTRAVENOUS | Status: DC | PRN

## 2015-09-17 MED ORDER — SODIUM CHLORIDE 0.9% FLUSH
3.0000 mL | Freq: Two times a day (BID) | INTRAVENOUS | Status: DC
  Administered 2015-09-17 – 2015-09-25 (×13): 3 mL via INTRAVENOUS

## 2015-09-17 MED ORDER — CARVEDILOL 25 MG PO TABS
25.0000 mg | ORAL_TABLET | Freq: Two times a day (BID) | ORAL | Status: DC
  Administered 2015-09-17: 25 mg via ORAL
  Filled 2015-09-17: qty 1

## 2015-09-17 MED ORDER — FUROSEMIDE 10 MG/ML IJ SOLN
40.0000 mg | INTRAMUSCULAR | Status: AC
  Administered 2015-09-17: 40 mg via INTRAVENOUS
  Filled 2015-09-17: qty 4

## 2015-09-17 MED ORDER — FUROSEMIDE 10 MG/ML IJ SOLN
40.0000 mg | Freq: Two times a day (BID) | INTRAMUSCULAR | Status: DC
  Administered 2015-09-18: 40 mg via INTRAVENOUS
  Filled 2015-09-17: qty 4

## 2015-09-17 MED ORDER — GUAIFENESIN ER 600 MG PO TB12
600.0000 mg | ORAL_TABLET | Freq: Two times a day (BID) | ORAL | Status: DC
  Administered 2015-09-17 – 2015-09-25 (×16): 600 mg via ORAL
  Filled 2015-09-17 (×16): qty 1

## 2015-09-17 NOTE — H&P (Addendum)
[name] DOB: 1935-07-25 DOA: 09/17/2015     PCP: Colonel Bald, MD  In [facility]: cardiology [place]. [place]/ [place], endocrinology [place] Patient coming from:  home Lives With family    Chief Complaint: Abdominal pain  HPI: Lisa [name] is a 80 y.o. female with medical history significant of HTN, CKD, Chronic combined CHF,  A. Fib on AC,  heart murmur, OSA with CPAP, DM  Presented with abdominal pain   for the past monthoccasionally radiating to her chest and lightheadedness not associated constipation or diarrhea she has been endorsing orthopnea and lower extremity edema she is worried that she is retaining fluid in her abdomen she had attempted to use pain medications for this it's also been associated with dark stools she endorses some nausea and vomiting. She was seen today by her cardiologist. Her weight was not ataxic come down from 177 pounds in June down to 173 pounds today. She has not been taking her medications correctly instead of switching from metoprolol to carve EDO she's been taking both the metoprol and Coreg but only taking Coreg once day. According to Cardiologist notes she appears to be euvolemic on exam earlier today.  Recently she have had episodes of hypoglycemia were to be secondary to taking NovoLog in large doses without relationship to food hemoglobin A1c recently was up to 12.2 Her blood sugars recently has been very elevated she is scheduled to see endocrinologist next week.  2 hours after seeing cardiology she presented to [facility] emergency department and plain and abdominal pain  Regarding pertinent Chronic problems: Was admitted in 2016 for A. fib with RVR at the time 2D echo revealed and EF of 40-45% with no wall motion abnormalities, functional bicuspid AV with mild AS. CHADSVASC score of 5 so Xarelto was started it has been maintained on carvedilol and digoxin  IN ER: HR 64 blood pressure  112/72 Blood sugar initially on arrival 62 currently up to 140 Lipase 18 Sodium 139 creatinine 176 which is up from baseline of 0.99 WBC 15.6 Hg 11.4  trop 0.09 Abdominal ultrasound: non-acute, possible hemangioma on the liver and used to be followed up CT of her abdomen no acute findings, but possible bilateral pleural effusions suggest CHF small pericardial effusion Etc. also worrisome for mild interstitial edema  Hospitalist was called for admission for acute renal failure and possible acute on chronic CHF exacerbation  Review of Systems:    Pertinent positives include: shortness of breath at rest. dyspnea on exertion, Bilateral lower extremity swelling   Constitutional:  No weight loss, night sweats, Fevers, chills, fatigue, weight loss  HEENT:  No headaches, Difficulty swallowing,Tooth/dental problems,Sore throat,  No sneezing, itching, ear ache, nasal congestion, post nasal drip,  Cardio-vascular:  No chest pain, Orthopnea, PND, anasarca, dizziness, palpitations.no GI:  No heartburn, indigestion, abdominal pain, nausea, vomiting, diarrhea, change in bowel habits, loss of appetite, melena, blood in stool, hematemesis Resp:  no No  No excess mucus, no productive cough, No non-productive cough, No coughing up of blood.No change in color of mucus.No wheezing. Skin:  no rash or lesions. No jaundice GU:  no dysuria, change in color of urine, no urgency or frequency. No straining to urinate.  No flank pain.  Musculoskeletal:  No joint pain or no joint swelling. No decreased range of motion. No back pain.  Psych:  No change in mood or affect. No depression or anxiety. No memory loss.  Neuro: no localizing neurological complaints, no tingling,  no weakness, no double vision, no gait abnormality, no slurred speech, no confusion  As per HPI otherwise 10 point review of systems negative.   Past Medical History: Past Medical History:  Diagnosis Date  . Anemia   . Carotid arterial  disease ([place])   . Chronic combined systolic and diastolic CHF (congestive heart failure) ([place])    a. 08/2014 Echo: EF 40-45%, no rwma, bicuspid AoV, mild AS, mod dil Asc Ao, PASP 9mmHg.  . CKD (chronic kidney disease), stage III   . Diabetes mellitus without complication ([place])   . Essential hypertension   . Mild aortic stenosis    a. 08/2014 Echo: mild AS, bicuspid AoV.  . OSA on CPAP   . Osteoarthritis of both ankles   . Persistent atrial fibrillation ([place])    a. Dx 08/2013, CHA2DS2VASc=5-->Xarelto.  . Vitamin D deficiency    Past Surgical History:  Procedure Laterality Date  . CHOLECYSTECTOMY       Social History:  Ambulatory   independently     reports that she has quit smoking. She has never used smokeless tobacco. She reports that she does not drink alcohol or use drugs.  Allergies:   Allergies  Allergen Reactions  . Lisinopril Cough       Family History:   Family History  Problem Relation Age of Onset  . Diabetes Mother   . Hyperlipidemia Mother   . Hypertension Mother   . Stroke Sister   . Heart attack Neg Hx     Medications: Prior to Admission medications   Medication Sig Start Date End Date Taking? Authorizing Provider  carvedilol (COREG) 25 MG tablet Take 1 tablet (25 mg total) by mouth 2 (two) times daily. 06/01/15  Yes Skeet Latch, MD  digoxin (LANOXIN) 0.125 MG tablet Take 1 tablet (0.125 mg total) by mouth daily. 08/06/15  Yes Skeet Latch, MD  furosemide (LASIX) 40 MG tablet Take 40 mg by mouth 3 (three) times daily.   Yes Historical Provider, MD  insulin aspart (NOVOLOG FLEXPEN) 100 UNIT/ML FlexPen Inject 10-15 Units into the skin 3 (three) times daily with meals. Pt uses per sliding scale.   Yes Historical Provider, MD  insulin glargine (LANTUS) 100 unit/mL SOPN Inject 30 Units into the skin at bedtime.   Yes Historical Provider, MD  metFORMIN (GLUCOPHAGE) 500 MG tablet Take 1 tablet (500 mg total) by mouth 2 (two) times daily with a meal.  08/02/15  Yes Philemon Kingdom, MD  rivaroxaban (XARELTO) 20 MG TABS tablet Take 1 tablet (20 mg total) by mouth daily with supper. 06/29/15  Yes Skeet Latch, MD  rosuvastatin (CRESTOR) 10 MG tablet Take 1 tablet (10 mg total) by mouth daily. 09/28/14  Yes Isaiah Serge, NP  valsartan (DIOVAN) 320 MG tablet Take 1 tablet (320 mg total) by mouth daily. 07/24/15  Yes Skeet Latch, MD    Physical Exam: Patient Vitals for the past 24 hrs:  BP Pulse Resp SpO2  09/17/15 1556 170/94 84 16 95 %  09/17/15 1222 120/62 (!) 59 17 96 %    1. General:  in No Acute distress 2. Psychological: Alert and   OrientedTo situation and not ANSWERING questions appropriately 3. Head/ENT:   Moist  Mucous Membranes                          Head Non traumatic, neck supple  Poor Dentition 4. SKIN: normal  kin turgor,  Skin clean Dry and intact no rash 5. Heart: Regular rate and rhythm mild systolic Murmur, but notRub or gallop 6. Lungs:  no wheezes some crackles   7. Abdomen: Soft, non-tender,   distended 8. Lower extremities: no clubbing, cyanosis, trace edema 9. Neurologically Grossly intact, moving all 4 extremities equally 10. MSK: Normal range of motion   body mass index is unknown because there is no height or weight on file.  Labs on Admission:   Labs on Admission: I have personally reviewed following labs and imaging studies  CBC:  Recent Labs Lab 09/17/15 1307  WBC 15.6*  HGB 11.4*  HCT 35.1*  MCV 82.8  PLT 123XX123   Basic Metabolic Panel:  Recent Labs Lab 09/17/15 1307  NA 139  K 4.4  CL 105  CO2 26  GLUCOSE 71  BUN 48*  CREATININE 1.76*  CALCIUM 8.8*   GFR: Estimated Creatinine Clearance: 25.4 mL/min (by C-G formula based on SCr of 1.76 mg/dL). Liver Function Tests:  Recent Labs Lab 09/17/15 1307  AST 28  ALT 23  ALKPHOS 85  BILITOT 1.0  PROT 7.1  ALBUMIN 3.6    Recent Labs Lab 09/17/15 1307  LIPASE 18   No results for input(s):  AMMONIA in the last 168 hours. Coagulation Profile: No results for input(s): INR, PROTIME in the last 168 hours. Cardiac Enzymes:  Recent Labs Lab 09/17/15 1307  TROPONINI 0.09*   BNP (last 3 results) No results for input(s): PROBNP in the last 8760 hours. HbA1C: No results for input(s): HGBA1C in the last 72 hours. CBG:  Recent Labs Lab 09/17/15 1407 09/17/15 1712  GLUCAP 62* 140*   Lipid Profile: No results for input(s): CHOL, HDL, LDLCALC, TRIG, CHOLHDL, LDLDIRECT in the last 72 hours. Thyroid Function Tests: No results for input(s): TSH, T4TOTAL, FREET4, T3FREE, THYROIDAB in the last 72 hours. Anemia Panel: No results for input(s): VITAMINB12, FOLATE, FERRITIN, TIBC, IRON, RETICCTPCT in the last 72 hours. Urine analysis:    Component Value Date/Time   COLORURINE YELLOW 09/17/2015 [place] 09/17/2015 1644   LABSPEC 1.008 09/17/2015 1644   PHURINE 6.0 09/17/2015 1644   GLUCOSEU NEGATIVE 09/17/2015 1644   HGBUR NEGATIVE 09/17/2015 1644   BILIRUBINUR NEGATIVE 09/17/2015 1644   KETONESUR NEGATIVE 09/17/2015 1644   PROTEINUR NEGATIVE 09/17/2015 1644   UROBILINOGEN 0.2 08/23/2014 1838   NITRITE NEGATIVE 09/17/2015 1644   LEUKOCYTESUR NEGATIVE 09/17/2015 1644   Sepsis Labs: @LABRCNTIP (procalcitonin:4,lacticidven:4) )No results found for this or any previous visit (from the past 240 hour(s)).    UA   no evidence of UTI  Lab Results  Component Value Date   HGBA1C 12.2 08/02/2015    Estimated Creatinine Clearance: 25.4 mL/min (by C-G formula based on SCr of 1.76 mg/dL).  BNP (last 3 results) No results for input(s): PROBNP in the last 8760 hours.   ECG REPORT  Independently reviewed Rate: 75  Rhythm: a.fib, RBBB ST&T Change: Nonspecific changes QTC 451  There were no vitals filed for this visit.   Cultures: No results found for: SDES, [place], CULT, REPTSTATUS   Radiological Exams on Admission: Dg Abdomen Acute W/chest  Result  Date: 09/17/2015 CLINICAL DATA:  Abdominal pain, chest pain EXAM: DG ABDOMEN ACUTE W/ 1V CHEST COMPARISON:  None. FINDINGS: Central vascular congestion and mild perihilar interstitial prominence suspicious for mild interstitial edema. Small right pleural effusion with right basilar atelectasis or infiltrate. There is normal small bowel gas pattern.  No evidence of free abdominal air. IMPRESSION: Central mild vascular congestion and mild perihilar interstitial prominence suspicious for mild interstitial edema. Small right pleural effusion with right basilar atelectasis or infiltrate. Normal small bowel gas pattern. Electronically Signed   By: Lahoma Crocker M.D.   On: 09/17/2015 14:54   Ct Renal Stone Study  Result Date: 09/17/2015 CLINICAL DATA:  Left lower abdominal pain and nausea. Congestive heart failure and chronic kidney disease stage 3. EXAM: CT ABDOMEN AND PELVIS WITHOUT CONTRAST TECHNIQUE: Multidetector CT imaging of the abdomen and pelvis was performed following the standard protocol without IV contrast. COMPARISON:  None. FINDINGS: Lower chest: Small bilateral pleural effusions and bibasilar atelectasis. Bibasilar interstitial prominence suspicious for pulmonary edema. Moderate cardiomegaly and small pericardial effusion. Aortic atherosclerosis and coronary artery calcification. Hepatobiliary: No mass visualized on this un-enhanced exam. Pancreas: No mass or inflammatory process identified on this un-enhanced exam. Spleen: Within normal limits in size. Adrenals/Urinary Tract: No evidence of urolithiasis or hydronephrosis. Unopacified urinary bladder is unremarkable in appearance. Stomach/Bowel: No evidence of obstruction, inflammatory process, or abnormal fluid collections. Normal appendix visualized. Vascular/Lymphatic: No pathologically enlarged lymph nodes. No evidence of abdominal aortic aneurysm. Aortic atherosclerosis noted. Reproductive: No mass or other significant abnormality. Other: None.  Musculoskeletal:  No suspicious bone lesions identified. IMPRESSION: No acute findings within the abdomen or pelvis. Cardiomegaly, bibasilar pulmonary interstitial prominence and small bilateral pleural effusions, suspicious for congestive heart failure. Small pericardial effusion. Aortic atherosclerosis and coronary artery calcification. Electronically Signed   By: Earle Gell M.D.   On: 09/17/2015 15:52   US Abdomen Limited Ruq  Result Date: 09/17/2015 CLINICAL DATA:  Epigastric abdominal pain. EXAM: US ABDOMEN LIMITED - RIGHT UPPER QUADRANT COMPARISON:  CT abdomen and pelvis 09/17/2015. Report of CT abdomen and pelvis with contrast 07/14/2008 and MRI abdomen 08/06/2007 FINDINGS: Gallbladder: Surgically absence Common bile duct: Diameter: Within normal limits following cholecystectomy and 11 mm. No obstructing mass lesion is present. Liver: A focal hyperechoic lesion is present at the inferior aspect of the right lobe of the liver measuring 3.3 x 1.9 x 2.9 cm. No other focal lesions are present. A right pleural effusion is noted. IMPRESSION: 1. No acute or focal lesion to explain the patient's abdominal pain. 2. **An incidental finding of potential clinical significance has been found. 3.3 cm hyperechoic lesion at the inferior aspect of the right lobe of the liver was not previously reported. This is nonspecific and most likely represents a hemangioma. This lesion was not discretely seen on the noncontrast CT of the abdomen and pelvis. It was not described on previous CT or MRI from 2010 and 2009. Follow-up CT of the abdomen only or MRI without and with contrast is recommended for further evaluation.** Electronically Signed   By: San Morelle M.D.   On: 09/17/2015 18:19    Chart has been reviewed    Assessment/Plan  80 y.o. female with medical history significant of HTN, CKD, Chronic combined CHF,  A. Fib on AC,  heart murmur, OSA with CPAP, DM being admitted with what seems to be acute on  chronic CHF with elevated troponin intermittent chest pain  Present on Admission: Acute on chronic combined heart failure- chest x-ray peripheral edema suggestive of fluid overload although weight has come down. Also elevated creatinine makes this somewhat more difficult to ascertain the patient does endorses orthopnea and she feels fluid overloaded. We'll continue gentle diuresis appreciate cardiology input . Chest pain patient unable to provide detailed history but seems like it's  coming and going. Currently patient states it has resolved. Will admit to telemetry continue to cycle cardiac enzymes . CKD (chronic kidney disease) stage 4, GFR 15-29 ml/min (HCC) - currently creatinine up to 1.7 but patient endorses feeling overloaded with fluid with peripheral edema as well as abdominal distention. Obtain urine electrolytes. Monitor creatinine carefully watched 10 to degenerative diureses . Essential hypertension continue home medications currently stable  Diabetes mellitus - would've frequent blood sugar checks. Given hypoglycemia initially on arrival. Decrease dose of Lantus down to 15 units. Concerned the patient may be noncompliant will need diabetic education . OSA on CPAP -will reorder C Pap . Persistent atrial fibrillation (HCC) - CHADSVASC score of 5 was on Xarelto for now will hold to evaluate Dark stools given renal insufficiency continue carvedilol and digoxin. Check digoxin level . Leukocytosis - unclear etiology, no history of fever chest x-ray and UA unremarkable CT scan of abdomen showing no evidence of infectious process continue to monitor . Elevated troponin - given intermittent chest pain and evidence of fluid overload we'll continue to cycle cardiac enzymes appreciate cardiology consult patient is currently on anticoagulation Dark stool - obtain Hemoccult stool patient unable to provide more detailed history regarding this  Hg 11.4 Close to baseline in  November 2016 Other plan as per  orders.  DVT prophylaxis: scd  Code Status:  FULL CODE    Family Communication:   Family not at  Bedside    Disposition Plan:    To home once workup is complete and patient is stable   Consults called: emailed cardiology   Admission status:    inpatient      Level of care    tele            I have spent a total of 56 min on this admission   Shandel Busic 09/17/2015, 8:31 PM    [facility]  Pager [phone]   after 2 AM please page floor coverage PA If 7AM-7PM, please contact the day team taking care of the patient  [email or web address]  Password [email or web address]

## 2015-09-17 NOTE — ED Provider Notes (Signed)
[facility] DEPT Provider Note   CSN: [id number] Arrival date & time: 09/17/15  1209     History   Chief Complaint Chief Complaint  Patient presents with  . Abdominal Pain    HPI Lisa [name] is a 80 y.o. female. Patient is a 80 year old female with history of CHF, CAD, CKD stage III, DM, HTN, OSA on CPAP, persistent afib, she presents to emergency department this afternoon with complaint of abdominal pain. She was at her cardiologist's office for a follow-up visit when she had severe abdominal pain which radiated across her lower abdomen and up to her chest. She felt lightheaded and her cardiologist encouraged her to come to the ER for workup. After arriving in the ER she reports that she has 0 pain but she has had episodes of pain like this at least twice a day for the last month.  She also complains of headache across the front of her head, fluid retention in her legs which improved over the past 4 days and orthopnea.  She denies any urinary symptoms, denies any bowel changes.  Recent medical history is somewhat confusing with language interpretation and lack of records. She reports a recent MI which was treated with presumed catheterization at [facility], there is no documentation of this provided chart. This is apparently the last month.  She also states that she had some bladder catheterization procedure, but clarification of this with language interpretation was not successful.     The history is provided by the patient. The history is limited by a language barrier. A language interpreter was used (language interpreter at bedside).    Past Medical History:  Diagnosis Date  . Anemia   . Carotid arterial disease ([place])   . Chronic combined systolic and diastolic CHF (congestive heart failure) ([place])    a. 08/2014 Echo: EF 40-45%, no rwma, bicuspid AoV, mild AS, mod dil Asc Ao, PASP 24mmHg.  . CKD (chronic kidney disease), stage III   . Diabetes mellitus without  complication ([place])   . Essential hypertension   . Mild aortic stenosis    a. 08/2014 Echo: mild AS, bicuspid AoV.  . OSA on CPAP   . Osteoarthritis of both ankles   . Persistent atrial fibrillation ([place])    a. Dx 08/2013, CHA2DS2VASc=5-->Xarelto.  . Vitamin D deficiency     Patient Active Problem List   Diagnosis Date Noted  . Uncontrolled type 2 diabetes mellitus with circulatory disorder, with long-term current use of insulin ([place]) 08/02/2015  . Essential hypertension   . Chronic combined systolic and diastolic CHF (congestive heart failure) ([place])   . CKD (chronic kidney disease) stage 4, GFR 15-29 ml/min (HCC)   . Mild aortic stenosis   . OSA on CPAP   . Persistent atrial fibrillation ([place])   . CKD (chronic kidney disease), stage III   . Acute diastolic CHF (congestive heart failure) ([place]) 08/25/2014  . Atrial fibrillation with RVR ([place]) 08/23/2014  . HTN (hypertension) 08/23/2014    Past Surgical History:  Procedure Laterality Date  . CHOLECYSTECTOMY      OB History    No data available       Home Medications    Prior to Admission medications   Medication Sig Start Date End Date Taking? Authorizing Provider  carvedilol (COREG) 25 MG tablet Take 1 tablet (25 mg total) by mouth 2 (two) times daily. 06/01/15  Yes Skeet Latch, MD  digoxin (LANOXIN) 0.125 MG tablet Take 1 tablet (0.125 mg total) by  mouth daily. 08/06/15  Yes Skeet Latch, MD  furosemide (LASIX) 40 MG tablet Take 40 mg by mouth 3 (three) times daily.   Yes Historical Provider, MD  insulin aspart (NOVOLOG FLEXPEN) 100 UNIT/ML FlexPen Inject 10-15 Units into the skin 3 (three) times daily with meals. Pt uses per sliding scale.   Yes Historical Provider, MD  insulin glargine (LANTUS) 100 unit/mL SOPN Inject 30 Units into the skin at bedtime.   Yes Historical Provider, MD  metFORMIN (GLUCOPHAGE) 500 MG tablet Take 1 tablet (500 mg total) by mouth 2 (two) times daily with a meal. 08/02/15  Yes Philemon Kingdom, MD  rivaroxaban (XARELTO) 20 MG TABS tablet Take 1 tablet (20 mg total) by mouth daily with supper. 06/29/15  Yes Skeet Latch, MD  rosuvastatin (CRESTOR) 10 MG tablet Take 1 tablet (10 mg total) by mouth daily. 09/28/14  Yes Isaiah Serge, NP  valsartan (DIOVAN) 320 MG tablet Take 1 tablet (320 mg total) by mouth daily. 07/24/15  Yes Skeet Latch, MD    Family History Family History  Problem Relation Age of Onset  . Diabetes Mother   . Hyperlipidemia Mother   . Hypertension Mother   . Stroke Sister   . Heart attack Neg Hx     Social History Social History  Substance Use Topics  . Smoking status: Former Research scientist (life sciences)  . Smokeless tobacco: Never Used  . Alcohol use No     Allergies   Lisinopril   Review of Systems Review of Systems   Physical Exam Updated Vital Signs BP 120/62   Pulse (!) 59   Resp 17   SpO2 96%   Physical Exam  Constitutional: She is oriented to person, place, and time. She appears well-developed and well-nourished. No distress.  HENT:  Head: Normocephalic and atraumatic.  Nose: Nose normal.  Mouth/Throat: Oropharynx is clear and moist. No oropharyngeal exudate.  Eyes: Conjunctivae and EOM are normal. Pupils are equal, round, and reactive to light. Right eye exhibits no discharge. Left eye exhibits no discharge. No scleral icterus.  Neck: Normal range of motion. JVD present. No tracheal deviation present. No thyromegaly present.  Cardiovascular: Normal rate, regular rhythm, normal heart sounds and intact distal pulses.  Exam reveals no gallop and no friction rub.   No murmur heard. Bilateral lower extremity edema, 1+  Pulmonary/Chest: Effort normal. No respiratory distress. She has no wheezes. She has rales. She exhibits no tenderness.  Epigastric abdominal tenderness to palpation  Abdominal: Soft. Bowel sounds are normal. She exhibits no distension and no mass. There is tenderness. There is no rebound and no guarding.  Musculoskeletal:  Normal range of motion. She exhibits no edema or tenderness.  Lymphadenopathy:    She has no cervical adenopathy.  Neurological: She is alert and oriented to person, place, and time. She exhibits normal muscle tone. Coordination normal.  Skin: Skin is warm and dry. Capillary refill takes less than 2 seconds. No rash noted. She is not diaphoretic. No erythema. No pallor.  Psychiatric: She has a normal mood and affect. Her behavior is normal. Judgment and thought content normal.  Nursing note and vitals reviewed.    ED Treatments / Results  Labs (all labs ordered are listed, but only abnormal results are displayed) Labs Reviewed  COMPREHENSIVE METABOLIC PANEL - Abnormal; Notable for the following:       Result Value   BUN 48 (*)    Creatinine, Ser 1.76 (*)    Calcium 8.8 (*)    GFR  calc non Af Amer 27 (*)    GFR calc Af Amer 31 (*)    All other components within normal limits  CBC - Abnormal; Notable for the following:    WBC 15.6 (*)    Hemoglobin 11.4 (*)    HCT 35.1 (*)    All other components within normal limits  TROPONIN I - Abnormal; Notable for the following:    Troponin I 0.09 (*)    All other components within normal limits  BRAIN NATRIURETIC PEPTIDE - Abnormal; Notable for the following:    B Natriuretic Peptide 1,492.9 (*)    All other components within normal limits  CBG MONITORING, ED - Abnormal; Notable for the following:    Glucose-Capillary 62 (*)    All other components within normal limits  LIPASE, BLOOD  URINALYSIS, ROUTINE W REFLEX MICROSCOPIC (NOT AT [facility])  POC OCCULT BLOOD, ED    EKG  EKG Interpretation  Date/Time:  Monday September 17 2015 14:12:57 EDT Ventricular Rate:  75 PR Interval:    QRS Duration: 148 QT Interval:  403 QTC Calculation: 451 R Axis:   66 Text Interpretation:  Atrial fibrillation IVCD, consider atypical RBBB Anterolateral infarct, age indeterminate slight morphology change to v2, v3.  Confirmed by Tyrone Nine MD, DANIEL [phone]) on  09/17/2015 3:02:29 PM       Radiology Dg Abdomen Acute W/chest  Result Date: 09/17/2015 CLINICAL DATA:  Abdominal pain, chest pain EXAM: DG ABDOMEN ACUTE W/ 1V CHEST COMPARISON:  None. FINDINGS: Central vascular congestion and mild perihilar interstitial prominence suspicious for mild interstitial edema. Small right pleural effusion with right basilar atelectasis or infiltrate. There is normal small bowel gas pattern. No evidence of free abdominal air. IMPRESSION: Central mild vascular congestion and mild perihilar interstitial prominence suspicious for mild interstitial edema. Small right pleural effusion with right basilar atelectasis or infiltrate. Normal small bowel gas pattern. Electronically Signed   By: Lahoma Crocker M.D.   On: 09/17/2015 14:54    Procedures Procedures (including critical care time)  Medications Ordered in ED Medications  furosemide (LASIX) injection 40 mg (not administered)     Initial Impression / Assessment and Plan / ED Course  I have reviewed the triage vital signs and the nursing notes.  Pertinent labs & imaging results that were available during my care of the patient were reviewed by me and considered in my medical decision making (see chart for details).  Clinical Course  Comment By Time  Interpreter [phone], Audrea Muscat, PA-C 08/14 [id number]    Pt with colicky abdominal pain and concerns of CHF, worked up cardiac, pt clinically hypervolemic with +JVD, rales, and mild LE edema. Pt had epigastric abdominal tenderness on exam, will work up abdomen.  Basic labs, troponin, EKG, CXR, BNP, UA.   3:41 PM I spoke with Dr. Oval Linsey who evaluated the patient this morning in clinic. She was told similar history that she had a heart attack and was admitted to [facility] which she states that she was unable to obtain any records, it is unclear what procedures if any she had had while there. The patient reported that fluid was drained out of her lungs  and her abdomen. Dr. Oval Linsey stated that with positive troponin and BNP she would likely need to come in for repeat echo and diuresis.    I have initiated with the ED secretary attempts to obtain any medical records from [facility].  Patient's history given is colicky abdominal pain, possible urinary catheterization  when she was last admitted in [place], will r/o kidney stone and cholecystitis with studies. Right upper quadrant ultrasound and CT renal stone study are currently pending. Her lab work is significant for leukocytosis of 15.6.  BUN and creatinine is elevated, significant for acute kidney injury, and a contrasted scan was avoided at this point. Urinalysis is currently pending, as reportedly obtained but not labeled correctly and may need to be recollected. Patient CBG was low at 62, she was given juice to drink. She was given first  IV dose of Lasix.    She will need to be admitted for further work up and treatment of CHF, AKI, and positive troponin (suspect demand ischemia).  Pending is RUQ [place] and UA.  Hospitalist to admit for CHF work up, Dr. Oval Linsey request repeat ECHO given no records available since a reported recent MI.  Hand off to Gay Filler, PA-C at shift change, who will admit pt after work up resulted.   Final Clinical Impressions(s) / ED Diagnoses   Final diagnoses:  None    New Prescriptions New Prescriptions   No medications on file     Delsa Grana, PA-C 09/17/15 [place], DO 09/17/15 1620

## 2015-09-17 NOTE — ED Notes (Signed)
Bed: [id number] Expected date:  Expected time:  Means of arrival:  Comments: 79/abd pain x 1 mo/non-english speaking

## 2015-09-17 NOTE — ED Triage Notes (Signed)
Patient here from home with complaints of abd pain 10/10 for 1 month. Dark stools. Nausea, no vomiting. Translator at bedside.

## 2015-09-17 NOTE — ED Provider Notes (Signed)
4:01 PM: Care assumes from Delsa Grana, [place]. Juhee Cooley is a 80 y.o. female with h/o CHF, CAD, CKD stage III, DM, HTN, OSA on CPAP, persistent afib presents to ED with complaint of intermittent abdominal pain. Presented to cardiologist office for f/u with severe lower abdominal pain with radiation into chest. Additional complaints include lightheadedness, headache, fluid retention, orthopnea. She is concerned about CHF. Physical exam remarkable for TTP of epigastric region, 1+ b/l pitting edema, JVD, and rales.   Labs remarkable for leukocytosis, AKI, elevated BNP, and elevated troponin. CXR suggestive of acute CHF exacerbation. CT abdomen/pelvis negative for acute abdominal pathology. Urine pending. RUQ [place] pending. Lasix given in ED. Pending results, will plan to admit for AKI, acute CHF exacerbation, and elevated troponin. Per review of notes, patient will likely need repeat cardiac work up while inpatient.   6:30 PM: Requesting records from [facility] from June 2017 to current.   7:04 PM: U/A negative for UTI. RUQ [place] remarkable for normal CBD diameter with 45mm non-obstruction mass lesion. Of incidental finding is 3.3x1.9x2.9cm lesion in liver.   7:07 PM: Spoke with Dr. Roel Cluck or [facility], greatly appreciate her time and input. Repeat troponin for trending. Agree to admit patient.    Roxanna Mew, [place] 09/17/15 2018    Tanna Furry, MD 09/18/15 [phone]

## 2015-09-17 NOTE — Progress Notes (Signed)
Cardiology Office Note   Date:  09/17/2015   ID:  Lisa [name], DOB 1935/11/25, MRN [id number]  PCP:  Colonel Bald, MD  Cardiologist:   Lisa Latch, MD   Chief Complaint  Patient presents with  . Post hosp f/u    heart attack--July 3  pt c/o pain all over today and headache--took tramadol and advil this morning and is feeling better--would like something for anxiety   Patient ID: Lisa [name] is a 80 y.o. female from [place] with hypertension, persistent atrial fibrillation, chronic systolic (LVEF [id number]) and diastolic heart failure, bicuspid aortic valve with mild AS, OSA, CKD, and DM Type 2 with who presents for a follow up.  She is here with her husband and Optometrist.  Ms. Leitz has been seen multiple times with poorly-controlled hypertension.  She is often unclear on which medications she is taking despite multiple attempts to review her medications in clinic.  Her blood glucose has also been poorly-controlled.  She was referred to endocrinology and has been working with Lisa [name].  She reports that her blood sugars have been much better controlled lately.  She reports being treated for a heart attack at [facility] last month.  She is unsure of how she was treated or whether she had a heart catheterization.  She thinks that it was treated with medications.  Since that time she continues to report chest pain, epigastric abdominal pain and shortness of breath.  She also endorses lower extremity edema, orthopnea and PND.  She has been extremely fatigued and notes that she has not been urinating as much as usual.  She denies constipation, diarrhea, melena or hematochezia.  She notes that her symptoms are not exertional, and she feels generally bad all the time.  She report that while in the hospital they drained fluid from her stomach and her lungs.   Past Medical History:  Diagnosis Date  . Anemia   . Carotid arterial disease ([place])   . Chronic  combined systolic and diastolic CHF (congestive heart failure) ([place])    a. 08/2014 Echo: EF 40-45%, no rwma, bicuspid AoV, mild AS, mod dil Asc Ao, PASP 70mmHg.  . CKD (chronic kidney disease), stage III   . Diabetes mellitus without complication ([place])   . Essential hypertension   . Mild aortic stenosis    a. 08/2014 Echo: mild AS, bicuspid AoV.  . OSA on CPAP   . Osteoarthritis of both ankles   . Persistent atrial fibrillation ([place])    a. Dx 08/2013, CHA2DS2VASc=5-->Xarelto.  . Vitamin D deficiency     Past Surgical History:  Procedure Laterality Date  . CHOLECYSTECTOMY       Current Outpatient Prescriptions  Medication Sig Dispense Refill  . ACCU-CHEK FASTCLIX LANCETS MISC Use 4x  day 200 each 4  . B-D INS SYR ULTRAFINE .5CC/29G 29G X 1/2" 0.5 ML MISC USE UTD TO INJECT INSULIN TWICE DAILY  6  . carvedilol (COREG) 25 MG tablet Take 1 tablet (25 mg total) by mouth 2 (two) times daily. 60 tablet 5  . digoxin (LANOXIN) 0.125 MG tablet Take 1 tablet (0.125 mg total) by mouth daily. 30 tablet 6  . furosemide (LASIX) 20 MG tablet Take 1 tablet (20 mg total) by mouth daily. (Patient taking differently: Take 40 mg by mouth 3 (three) times daily. ) 30 tablet 6  . glucose blood (ACCU-CHEK GUIDE) test strip Use as instructed 100 each 12  . insulin aspart (NOVOLOG) 100 UNIT/ML FlexPen  Inject 15-25 Units into the skin 3 (three) times daily with meals. (Patient taking differently: Inject 10-15 Units into the skin 3 (three) times daily with meals. ) 15 mL 2  . Insulin Glargine (LANTUS) 100 UNIT/ML Solostar Pen Inject 55 Units into the skin daily at 10 pm. (Patient taking differently: Inject 30 Units into the skin daily at 10 pm. ) 15 mL 2  . Insulin Pen Needle (CARETOUCH PEN NEEDLES) 32G X 4 MM MISC Use 4x a day 200 each 4  . metFORMIN (GLUCOPHAGE) 500 MG tablet Take 1 tablet (500 mg total) by mouth 2 (two) times daily with a meal. 180 tablet 3  . rivaroxaban (XARELTO) 20 MG TABS tablet Take 1 tablet  (20 mg total) by mouth daily with supper. 30 tablet 11  . rosuvastatin (CRESTOR) 10 MG tablet Take 1 tablet (10 mg total) by mouth daily. 90 tablet 3  . valsartan (DIOVAN) 320 MG tablet Take 1 tablet (320 mg total) by mouth daily. 30 tablet 6   No current facility-administered medications for this visit.     Allergies:   Lisinopril    Social History:  The patient  reports that she has quit smoking. She has never used smokeless tobacco. She reports that she does not drink alcohol or use drugs.   Family History:  The patient's family history includes Diabetes in her mother; Hyperlipidemia in her mother; Hypertension in her mother; Stroke in her sister.    ROS:  Please see the history of present illness.   Otherwise, review of systems are positive for none.   All other systems are reviewed and negative.    PHYSICAL EXAM: VS:  BP 108/64 (BP Location: Left Arm, Patient Position: Sitting, Cuff Size: Normal)   Pulse (!) 59   Ht 5\' 1"  (1.549 m)   Wt 173 lb (78.5 kg)   BMI 32.69 kg/m  , BMI Body mass index is 32.69 kg/m. GENERAL: Ill-appearing and fatigued.  Mild respiratory distress. HEENT:  Pupils equal round and reactive, fundi not visualized, oral mucosa unremarkable NECK:  JVP 2 cm above clavicle sitting upright.  Waveform within normal limits, carotid upstroke brisk and symmetric, no bruits, no thyromegaly LYMPHATICS:  No cervical adenopathy LUNGS:  Diminished at bilateral bases.  No crackles, rhonchi or wheezes. HEART:  Irregularly irregular. PMI not displaced or sustained,S1 and S2 within normal limits, no S3, no S4, no clicks, no rubs, III/VI early-peaking crescendo-decrescendo murmur at LUSB with radiation to the carotids ABD:  Flat, positive bowel sounds normal in frequency in pitch, no bruits, no rebound, no guarding, no midline pulsatile mass, no hepatomegaly, no splenomegaly EXT:  2 plus pulses throughout, 1+ pitting edema bilaterally, no cyanosis no clubbing SKIN:  No rashes  no nodules NEURO:  Cranial nerves II through XII grossly intact, motor grossly intact throughout PSYCH:  Cognitively intact, oriented to person place and time   EKG:  EKG is ordered today. The ekg ordered today demonstrates atrial fibrillation rate 68 bpm.  RBBB.  LPFB.  Cannot rule out prior anterior and inferior infarcts.    Recent Labs: 06/29/2015: BUN 27; Creat 0.99; Potassium 4.9; Sodium 133    Lipid Panel    Component Value Date/Time   CHOL 128 08/24/2014 0407   TRIG 77 08/24/2014 0407   HDL 30 (L) 08/24/2014 0407   CHOLHDL 4.3 08/24/2014 0407   VLDL 15 08/24/2014 0407   LDLCALC 83 08/24/2014 0407      Wt Readings from Last 3 Encounters:  09/17/15  173 lb (78.5 kg)  09/14/15 176 lb (79.8 kg)  08/02/15 177 lb (80.3 kg)    Echo 08/24/14: Study Conclusions  - Left ventricle: The cavity size was normal. There was moderate concentric hypertrophy. Systolic function was mildly to moderately reduced. The estimated ejection fraction was in the range of 40% to 45%. Wall motion was normal; there were no regional wall motion abnormalities. - Aortic valve: Functionally bicuspid; moderately thickened, moderately calcified leaflets. Valve mobility was restricted. There was mild stenosis. Mean gradient (S): 9 mm Hg. Peak gradient (S): 18 mm Hg. - Aortic root: The aortic root was normal in size. - Ascending aorta: The ascending aorta was moderately dilated measuring 44 mm. - Mitral valve: Calcified annulus. Mildly thickened leaflets . There was trivial regurgitation. - Left atrium: The atrium was mildly dilated. - Right ventricle: Systolic function was normal. - Right atrium: The atrium was normal in size. - Tricuspid valve: Structurally normal valve. There was mild-moderate regurgitation. - Pulmonary arteries: Systolic pressure was mildly increased. PA peak pressure: 39 mm Hg (S). - Inferior vena cava: The vessel was normal in size. - Pericardium,  extracardiac: There was no pericardial effusion.  Other studies Reviewed: Additional studies/ records that were reviewed today include: . Review of the above records demonstrates:  Please see elsewhere in the note.     ASSESSMENT AND PLAN:  # Chronic systolic and diastolic heart failure: LVEF was 45% 08/2014.  However, she had a recent MI and the records are not currently available.  She appears to be volume overloaded on exam and reports symptoms of heart failure.  She does not look well and would benefit for admission to expedite her evaluation and treatment.  We will obtain the records from her recent hospitalization.  She will likely need a repeat echo and diuresis.  Continue carvedilol, valsartan, lasix and digoxin.   # CAD s/p MI: I suspect that she had demand ischemia, given that she is not on DAPT.  However we will obtain the records as above.  She is having some chest discomfort now so will need to be ruled out for M.  # Chronic systolic and diastolic heart failure: LVEF 45%.  She is euvolemic on exam today. She is to continue carvedilol, losartan, lasix and digoxin.   # Hypertension: BP well controlled today.  Continue valsartan and carvedilol.  .   # Persistent atrial fibrillation:  CHA2DS2-VASc is 5.  Rate is well-controlled.  She remains in atrial fibrillation.  Continue carvedilol, Xarelto and digoxin.    # Mild aortic stenosis/bicuspid aortic valve: She is volume overloaded, though this is unlikely to be the cause.  She will need an echo as above.   Current medicines are reviewed at length with the patient today.  The patient does not have concerns regarding medicines.  The following changes have been made:  Stop metoprolol and start carvedilol.  Labs/ tests ordered today include:  No orders of the defined types were placed in this encounter.   Time spent: 45 minutes-Greater than 50% of this time was spent in counseling, explanation of diagnosis, planning of further  management, and coordination of care.   Disposition:   FU with Jamahl Lemmons C. Oval Linsey, MD in 1 month.    Signed, Lisa Latch, MD  09/17/2015 10:54 AM    [place]

## 2015-09-18 ENCOUNTER — Inpatient Hospital Stay (HOSPITAL_COMMUNITY): Payer: Medicare Other

## 2015-09-18 DIAGNOSIS — I509 Heart failure, unspecified: Secondary | ICD-10-CM

## 2015-09-18 DIAGNOSIS — R7989 Other specified abnormal findings of blood chemistry: Secondary | ICD-10-CM

## 2015-09-18 DIAGNOSIS — N179 Acute kidney failure, unspecified: Secondary | ICD-10-CM

## 2015-09-18 DIAGNOSIS — I10 Essential (primary) hypertension: Secondary | ICD-10-CM

## 2015-09-18 DIAGNOSIS — I455 Other specified heart block: Secondary | ICD-10-CM

## 2015-09-18 DIAGNOSIS — K7689 Other specified diseases of liver: Secondary | ICD-10-CM

## 2015-09-18 DIAGNOSIS — I481 Persistent atrial fibrillation: Secondary | ICD-10-CM

## 2015-09-18 DIAGNOSIS — K769 Liver disease, unspecified: Secondary | ICD-10-CM | POA: Diagnosis present

## 2015-09-18 DIAGNOSIS — I5043 Acute on chronic combined systolic (congestive) and diastolic (congestive) heart failure: Secondary | ICD-10-CM

## 2015-09-18 LAB — BRAIN NATRIURETIC PEPTIDE: B NATRIURETIC PEPTIDE 5: 2503.1 pg/mL — AB (ref 0.0–100.0)

## 2015-09-18 LAB — BASIC METABOLIC PANEL
ANION GAP: 11 (ref 5–15)
BUN: 48 mg/dL — AB (ref 6–20)
CO2: 27 mmol/L (ref 22–32)
Calcium: 8.6 mg/dL — ABNORMAL LOW (ref 8.9–10.3)
Chloride: 101 mmol/L (ref 101–111)
Creatinine, Ser: 1.71 mg/dL — ABNORMAL HIGH (ref 0.44–1.00)
GFR, EST AFRICAN AMERICAN: 32 mL/min — AB (ref >60)
GFR, EST NON AFRICAN AMERICAN: 28 mL/min — AB (ref >60)
Glucose, Bld: 117 mg/dL — ABNORMAL HIGH (ref 65–99)
POTASSIUM: 3.5 mmol/L (ref 3.5–5.1)
SODIUM: 139 mmol/L (ref 135–145)

## 2015-09-18 LAB — GLUCOSE, CAPILLARY
GLUCOSE-CAPILLARY: 155 mg/dL — AB (ref 65–99)
GLUCOSE-CAPILLARY: 94 mg/dL (ref 65–99)
GLUCOSE-CAPILLARY: 329 mg/dL — AB (ref 65–99)
Glucose-Capillary: 306 mg/dL — ABNORMAL HIGH (ref 65–99)
Glucose-Capillary: 126 mg/dL — ABNORMAL HIGH (ref 65–99)

## 2015-09-18 LAB — IRON AND TIBC
Iron: 38 ug/dL (ref 28–170)
SATURATION RATIOS: 13 % (ref 10.4–31.8)
TIBC: 301 ug/dL (ref 250–450)
UIBC: 263 ug/dL

## 2015-09-18 LAB — OCCULT BLOOD X 1 CARD TO LAB, STOOL: FECAL OCCULT BLD: NEGATIVE

## 2015-09-18 LAB — ECHOCARDIOGRAM COMPLETE
HEIGHTINCHES: 61 in
WEIGHTICAEL: 2709.01 [oz_av]

## 2015-09-18 LAB — FOLATE: FOLATE: 19.5 ng/mL (ref >5.9)

## 2015-09-18 LAB — RETICULOCYTES
RBC.: 3.94 MIL/uL (ref 3.87–5.11)
RETIC COUNT ABSOLUTE: 78.8 10*3/uL (ref 19.0–186.0)
RETIC CT PCT: 2 % (ref 0.4–3.1)

## 2015-09-18 LAB — FERRITIN: FERRITIN: 150 ng/mL (ref 11–307)

## 2015-09-18 LAB — TROPONIN I
TROPONIN I: 0.15 ng/mL — AB (ref <0.03)
Troponin I: 0.14 ng/mL (ref <0.03)
Troponin I: 0.17 ng/mL (ref <0.03)

## 2015-09-18 LAB — VITAMIN B12: Vitamin B-12: 229 pg/mL (ref 180–914)

## 2015-09-18 LAB — DIGOXIN LEVEL: Digoxin Level: 1.6 ng/mL (ref 0.8–2.0)

## 2015-09-18 MED ORDER — HYDRALAZINE HCL 25 MG PO TABS
12.5000 mg | ORAL_TABLET | Freq: Three times a day (TID) | ORAL | Status: DC
  Administered 2015-09-18 – 2015-09-19 (×2): 12.5 mg via ORAL
  Filled 2015-09-18 (×2): qty 1

## 2015-09-18 MED ORDER — INSULIN ASPART 100 UNIT/ML ~~LOC~~ SOLN
0.0000 [IU] | Freq: Three times a day (TID) | SUBCUTANEOUS | Status: DC
  Administered 2015-09-18: 15 [IU] via SUBCUTANEOUS
  Administered 2015-09-19: 7 [IU] via SUBCUTANEOUS
  Administered 2015-09-19: 11 [IU] via SUBCUTANEOUS
  Administered 2015-09-20: 7 [IU] via SUBCUTANEOUS
  Administered 2015-09-20: 11 [IU] via SUBCUTANEOUS
  Administered 2015-09-20: 7 [IU] via SUBCUTANEOUS
  Administered 2015-09-21: 15 [IU] via SUBCUTANEOUS
  Administered 2015-09-21 – 2015-09-22 (×3): 11 [IU] via SUBCUTANEOUS
  Administered 2015-09-22: 7 [IU] via SUBCUTANEOUS
  Administered 2015-09-22: 3 [IU] via SUBCUTANEOUS
  Administered 2015-09-23: 4 [IU] via SUBCUTANEOUS
  Administered 2015-09-23 (×2): 11 [IU] via SUBCUTANEOUS
  Administered 2015-09-24 – 2015-09-25 (×4): 4 [IU] via SUBCUTANEOUS
  Administered 2015-09-25: 8 [IU] via SUBCUTANEOUS

## 2015-09-18 MED ORDER — PERFLUTREN LIPID MICROSPHERE
INTRAVENOUS | Status: AC
  Filled 2015-09-18: qty 10

## 2015-09-18 MED ORDER — PERFLUTREN LIPID MICROSPHERE
1.0000 mL | INTRAVENOUS | Status: AC | PRN
  Administered 2015-09-18: 2 mL via INTRAVENOUS
  Filled 2015-09-18: qty 10

## 2015-09-18 MED ORDER — FUROSEMIDE 10 MG/ML IJ SOLN
60.0000 mg | Freq: Two times a day (BID) | INTRAMUSCULAR | Status: DC
  Administered 2015-09-18 – 2015-09-23 (×10): 60 mg via INTRAVENOUS
  Filled 2015-09-18 (×10): qty 6

## 2015-09-18 MED ORDER — ISOSORBIDE MONONITRATE ER 30 MG PO TB24
15.0000 mg | ORAL_TABLET | Freq: Every day | ORAL | Status: DC
  Administered 2015-09-18 – 2015-09-19 (×2): 15 mg via ORAL
  Filled 2015-09-18 (×2): qty 1

## 2015-09-18 MED ORDER — CARVEDILOL 12.5 MG PO TABS
12.5000 mg | ORAL_TABLET | Freq: Two times a day (BID) | ORAL | Status: DC
  Administered 2015-09-18 – 2015-09-25 (×15): 12.5 mg via ORAL
  Filled 2015-09-18 (×15): qty 1

## 2015-09-18 MED ORDER — INSULIN ASPART 100 UNIT/ML ~~LOC~~ SOLN
0.0000 [IU] | Freq: Every day | SUBCUTANEOUS | Status: DC
  Administered 2015-09-19 – 2015-09-22 (×2): 2 [IU] via SUBCUTANEOUS
  Administered 2015-09-23: 3 [IU] via SUBCUTANEOUS
  Administered 2015-09-24: 2 [IU] via SUBCUTANEOUS

## 2015-09-18 MED ORDER — INSULIN ASPART 100 UNIT/ML ~~LOC~~ SOLN
4.0000 [IU] | Freq: Three times a day (TID) | SUBCUTANEOUS | Status: DC
  Administered 2015-09-18 – 2015-09-21 (×7): 4 [IU] via SUBCUTANEOUS

## 2015-09-18 NOTE — Progress Notes (Signed)
On-call provider Baltazar Najjar made aware that pt has h/o of of afib and is currently in a fib  and has had 3-4 brief episodes of  pauses with HR dropping to 39 non- sustaining< 3 secs. Pt remains asymptomatic and quickly returns to hr in  59-60s.

## 2015-09-18 NOTE — Progress Notes (Signed)
[facility]  Patient Status: Active (receiving services up to time of hospitalization)  [facility] is providing the following services: RN and PT  If patient discharges after hours, please call [phone].   Lisa [name] 09/18/2015, 3:14 PM

## 2015-09-18 NOTE — Consult Note (Signed)
Cardiology Consult    Patient ID: Lisa [name] MRN: [id number], DOB/AGE: [birth year]   Admit date: 09/17/2015 Date of Consult: 09/18/2015  Primary Physician: Colonel Bald, MD Reason for Consult: CHF Primary Cardiologist: Dr. Oval Linsey Requesting Provider: Dr. Rockne Menghini   History of Present Illness    Lisa [name] is a 80 y.o. female with past medical history of chronic combined systolic and diastolic CHF (EF [id number] by echo in 08/2014), persistent atrial fibrillation (on Xarelto), mild AS, OSA (on CPAP), CKD, and Type 2 DM who presented to [place] ED on 09/17/2015 for abdominal pain and nausea.   Was seen in the office by Dr. Oval Linsey earlier that day and reported having a heart attack at [facility] last month. Unsure of how she was treated or whether she had a cardiac catheterization. Reported at the office visit having worsening dyspnea and lower extremity edema. Reported not urinating as much as usual. She was volume overloaded at that time and Dr. Oval Linsey made mention she would benefit from an admission for diuresis. She later presented to [facility] within two hours of her office visit for abdominal pain.  Weight was noted to be 173 lbs in the office, previously 177 lbs in 07/2015. The H&P from yesterday mentions she had been taking her medications incorrectly, taking both Metoprolol and Carvedilol since her hospital discharge in 07/2015. While admitted, pauses up to 2.1 seconds have been noted on telemetry.   She has reported having episodes of abdominal pain for the past month, which occasionally radiates into her chest. Concerned she is retaining fluid in her abdomen.  While admitted, labs have shown a WBC of 15.6, Hgb 11.4, platelets 279. K+ 4.4, creatinine 1.76 (was 0.99 two months ago). BNP 1492. Dig 1.6. EKG shows atrial fibrillation, HR 62, RBBB, and diffuse TWI Cyclic troponin values have been 0.12, 0.17, 0.15, and 0.14. Occult blood negative. CXR on  admission showing Central mild vascular congestion and mild perihilar interstitial prominence suspicious for mild interstitial edema. Small right pleural effusion with right basilar atelectasis or infiltrate. Abdominal US with no acute or focal lesion to explain the patient's abdominal pain but an incidental finding of a 3.3 cm hyperechoic lesion at the inferior aspect of the right lobe of the liver was noted, likely representing a hemangioma. F/U CT or MRI recommended.  Repeat echocardiogram this admission shows an EF of 30-35% with akinesis of the mid-apicalanteroseptal, anterolateral, inferoseptal, and apical myocardium with Grade 3 DD. Mild AS noted.  An interpreter was used for this encounter. Patient is hard of hearing and makes this process difficult. Patient kept telling the interpreter she had abdominal pain and felt like the fluid was contributing to her pain. Asking for [place] to "get the fluid off".   Past Medical History   Past Medical History:  Diagnosis Date  . Anemia   . Carotid arterial disease ([place])   . Chronic combined systolic and diastolic CHF (congestive heart failure) ([place])    a. 08/2014 Echo: EF 40-45%, no rwma, bicuspid AoV, mild AS, mod dil Asc Ao, PASP 53mmHg.  . CKD (chronic kidney disease), stage III   . Diabetes mellitus without complication ([place])   . Essential hypertension   . Mild aortic stenosis    a. 08/2014 Echo: mild AS, bicuspid AoV.  . OSA on CPAP   . Osteoarthritis of both ankles   . Persistent atrial fibrillation ([place])    a. Dx 08/2013, CHA2DS2VASc=5-->Xarelto.  . Vitamin D deficiency  Past Surgical History:  Procedure Laterality Date  . CHOLECYSTECTOMY       Allergies  Allergies  Allergen Reactions  . Lisinopril Cough    Inpatient Medications    . carvedilol  12.5 mg Oral BID WC  . digoxin  0.125 mg Oral Daily  . furosemide  40 mg Intravenous BID  . guaiFENesin  600 mg Oral BID  . insulin aspart  0-9 Units Subcutaneous Q4H  . insulin  glargine  15 Units Subcutaneous QHS  . rosuvastatin  10 mg Oral Daily  . sodium chloride flush  3 mL Intravenous Q12H    Family History    Family History  Problem Relation Age of Onset  . Diabetes Mother   . Hyperlipidemia Mother   . Hypertension Mother   . Stroke Sister   . Heart attack Neg Hx     Social History    Social History   Social History  . Marital status: Married    Spouse name: N/A  . Number of children: N/A  . Years of education: N/A   Occupational History  . Not on file.   Social History Main Topics  . Smoking status: Former Research scientist (life sciences)  . Smokeless tobacco: Never Used  . Alcohol use No  . Drug use: No  . Sexual activity: Not on file   Other Topics Concern  . Not on file   Social History Narrative  . No narrative on file     Review of Systems    General:  No chills, fever, night sweats or weight changes.  Cardiovascular:  No chest pain, palpitations, paroxysmal nocturnal dyspnea. Positive for edema, orthopnea, and dyspnea.  Dermatological: No rash, lesions/masses Respiratory: No cough, dyspnea Urologic: No hematuria, dysuria Abdominal:   No  vomiting, diarrhea, bright red blood per rectum, melena, or hematemesis. Positive for abdominal pain and nausea.  Neurologic:  No visual changes, wkns, changes in mental status. All other systems reviewed and are otherwise negative except as noted above.  Physical Exam    Blood pressure 108/66, pulse 66, temperature 97.6 F (36.4 C), temperature source Oral, resp. rate 18, height 5\' 1"  (1.549 m), weight 169 lb 5 oz (76.8 kg), SpO2 96 %.  General: Pleasant, elderly female appearing in NAD Psych: Normal affect. Neuro: Alert and oriented X 3. Moves all extremities spontaneously. HEENT: Normal  Neck: Supple without bruits or JVD. Lungs:  Resp regular and unlabored, mild rales at bases bilaterally. Heart: Irregularly irregular, no s3, s4, 2/6 SEM at RUSB. Abdomen: Soft, non-tender, non-distended, BS + x 4.    Extremities: No clubbing, cyanosis or edema. DP/PT/Radials 2+ and equal bilaterally.  Labs    Troponin (Point of Care Test) No results for input(s): TROPIPOC in the last 72 hours.  Recent Labs  09/17/15 1937 09/18/15 0000 09/18/15 0601 09/18/15 1133  TROPONINI 0.12* 0.17* 0.15* 0.14*   Lab Results  Component Value Date   WBC 15.6 (H) 09/17/2015   HGB 11.4 (L) 09/17/2015   HCT 35.1 (L) 09/17/2015   MCV 82.8 09/17/2015   PLT 279 09/17/2015    Recent Labs Lab 09/17/15 1307 09/18/15 0601  NA 139 139  K 4.4 3.5  CL 105 101  CO2 26 27  BUN 48* 48*  CREATININE 1.76* 1.71*  CALCIUM 8.8* 8.6*  PROT 7.1  --   BILITOT 1.0  --   ALKPHOS 85  --   ALT 23  --   AST 28  --   GLUCOSE 71 117*   Lab  Results  Component Value Date   CHOL 128 08/24/2014   HDL 30 (L) 08/24/2014   LDLCALC 83 08/24/2014   TRIG 77 08/24/2014   No results found for: [facility]   Radiology Studies    Dg Abdomen Acute W/chest  Result Date: 09/17/2015 CLINICAL DATA:  Abdominal pain, chest pain EXAM: DG ABDOMEN ACUTE W/ 1V CHEST COMPARISON:  None. FINDINGS: Central vascular congestion and mild perihilar interstitial prominence suspicious for mild interstitial edema. Small right pleural effusion with right basilar atelectasis or infiltrate. There is normal small bowel gas pattern. No evidence of free abdominal air. IMPRESSION: Central mild vascular congestion and mild perihilar interstitial prominence suspicious for mild interstitial edema. Small right pleural effusion with right basilar atelectasis or infiltrate. Normal small bowel gas pattern. Electronically Signed   By: Lahoma Crocker M.D.   On: 09/17/2015 14:54   Ct Renal Stone Study  Result Date: 09/17/2015 CLINICAL DATA:  Left lower abdominal pain and nausea. Congestive heart failure and chronic kidney disease stage 3. EXAM: CT ABDOMEN AND PELVIS WITHOUT CONTRAST TECHNIQUE: Multidetector CT imaging of the abdomen and pelvis was performed following the  standard protocol without IV contrast. COMPARISON:  None. FINDINGS: Lower chest: Small bilateral pleural effusions and bibasilar atelectasis. Bibasilar interstitial prominence suspicious for pulmonary edema. Moderate cardiomegaly and small pericardial effusion. Aortic atherosclerosis and coronary artery calcification. Hepatobiliary: No mass visualized on this un-enhanced exam. Pancreas: No mass or inflammatory process identified on this un-enhanced exam. Spleen: Within normal limits in size. Adrenals/Urinary Tract: No evidence of urolithiasis or hydronephrosis. Unopacified urinary bladder is unremarkable in appearance. Stomach/Bowel: No evidence of obstruction, inflammatory process, or abnormal fluid collections. Normal appendix visualized. Vascular/Lymphatic: No pathologically enlarged lymph nodes. No evidence of abdominal aortic aneurysm. Aortic atherosclerosis noted. Reproductive: No mass or other significant abnormality. Other: None. Musculoskeletal:  No suspicious bone lesions identified. IMPRESSION: No acute findings within the abdomen or pelvis. Cardiomegaly, bibasilar pulmonary interstitial prominence and small bilateral pleural effusions, suspicious for congestive heart failure. Small pericardial effusion. Aortic atherosclerosis and coronary artery calcification. Electronically Signed   By: Earle Gell M.D.   On: 09/17/2015 15:52   US Abdomen Limited Ruq  Result Date: 09/17/2015 CLINICAL DATA:  Epigastric abdominal pain. EXAM: US ABDOMEN LIMITED - RIGHT UPPER QUADRANT COMPARISON:  CT abdomen and pelvis 09/17/2015. Report of CT abdomen and pelvis with contrast 07/14/2008 and MRI abdomen 08/06/2007 FINDINGS: Gallbladder: Surgically absence Common bile duct: Diameter: Within normal limits following cholecystectomy and 11 mm. No obstructing mass lesion is present. Liver: A focal hyperechoic lesion is present at the inferior aspect of the right lobe of the liver measuring 3.3 x 1.9 x 2.9 cm. No other focal  lesions are present. A right pleural effusion is noted. IMPRESSION: 1. No acute or focal lesion to explain the patient's abdominal pain. 2. **An incidental finding of potential clinical significance has been found. 3.3 cm hyperechoic lesion at the inferior aspect of the right lobe of the liver was not previously reported. This is nonspecific and most likely represents a hemangioma. This lesion was not discretely seen on the noncontrast CT of the abdomen and pelvis. It was not described on previous CT or MRI from 2010 and 2009. Follow-up CT of the abdomen only or MRI without and with contrast is recommended for further evaluation.** Electronically Signed   By: San Morelle M.D.   On: 09/17/2015 18:19    EKG & Cardiac Imaging    EKG: Atrial fibrillation, HR 62, RBBB, and diffuse TWI  Echocardiogram: 09/18/2015 Study Conclusions  - Left ventricle: The cavity size was normal. There was severe   concentric hypertrophy. Systolic function was moderately to   severely reduced. The estimated ejection fraction was in the   range of 30% to 35%. There is akinesis of the   mid-apicalanteroseptal, anterolateral, inferoseptal, and apical   myocardium. Doppler parameters are consistent with a reversible   restrictive pattern, indicative of decreased left ventricular   diastolic compliance and/or increased left atrial pressure (grade   3 diastolic dysfunction). - Aortic valve: Trileaflet; moderately thickened, moderately   calcified leaflets. Valve mobility was restricted. There was mild   stenosis. Peak velocity (S): 283 cm/s. Mean gradient (S): 14 mm   Hg. - Mitral valve: Calcified annulus. Mildly thickened leaflets . - Left atrium: The atrium was severely dilated. Volume/bsa, ES   (1-plane Simpson&'s, A4C): 54.6 ml/m^2. - Right ventricle: The cavity size was mildly dilated. Wall   thickness was normal. - Pulmonary arteries: Systolic pressure was moderately increased.   PA peak pressure: 52 mm  Hg (S).  Impressions:  - Since prior echocardiogram, aortic stenosis has advanced (prior   mean gradient 29mmHg).  Assessment & Plan    1. Acute on Chronic Combined Systolic and Diastolic CHF - echo in [id number] showed EF of 40-45%. Repeat echo this admission shows EF of 30% to 35%. There is akinesis of the mid-apicalanteroseptal, anterolateral, inferoseptal, and apical myocardium. Reports having an MI at [facility] last month. Need to obtain these records. - BNP 1492 on admission and CXR  showing central mild vascular congestion and mild perihilar interstitial prominence suspicious for mild interstitial edema. Small right pleural effusion with right basilar atelectasis or infiltrate.  - started on IV Lasix 60mg  BID with only a recorded net output of -343mL thus far. Obtain daily weights and strict I&O's. Weight is actually lower than baseline of 177 lbs.   - continue Coreg 12.5mg  BID. ARB currently held with AKI. Dig level up to 1.6. Would discontinue this.   2. Chronic Atrial Fibrillation - was taking both Lopressor and Coreg PTA instead of Coreg alone. Pauses up to 2.1 seconds noted on telemetry, likely secondary to taking multiple AV nodal blocking agents. Continue Coreg for rate-control. - This patients CHA2DS2-VASc Score and unadjusted Ischemic Stroke Rate (% per year) is equal to 9.7 % stroke rate/year from a score of 6 (CHF, HTN, DM, Female, Age (2)). On Xarelto PTA, held due to patient reporting dark tarry stools. Occult blood negative and Hgb stable. Would resume with her significant stroke risk.   3. Elevated Troponin - Cyclic troponin values have been 0.12, 0.17, 0.15, and 0.14. Flat trend, likely secondary to CHF exacerbation and AKI.  - need to obtain recent records from [facility].  4. AKI - creatinine 1.76 (was 0.99 two months ago).   5. Abdominal Pain - Abdominal US with no acute or focal lesion to explain the patient's abdominal pain but an incidental  finding of a 3.3 cm hyperechoic lesion at the inferior aspect of the right lobe of the liver was noted, likely representing a hemangioma. F/U CT or MRI recommended. - per admitting team  Signed, Erma Heritage, PA-C 09/18/2015, 4:10 PM Pager: [phone]  Patient seen and examined. Agree with assessment and plan.  Lisa [name] is a 80 year old female from [place] who does not speak [place].  She is a patient of Dr. Skeet Latch and was seen by her yesterday.  Went to her evaluation she developed more  significant abdominal discomfort which he had been experiencing over the past month.  Upon presentation, she had volume overload with probable acute on chronic CHF with a BNP of 1492.  BNP today is significantly elevated at 2503.  Her ECG revealed atrial fibrillation, right bundle branch block and diffuse T-wave changes.  Troponins have been mildly positive suggestive of a demand disc ischemia response rather than acute coronary syndrome.  She was found to have small right pleural effusion.  Echo Doppler study has revealed severe LV dysfunction with an ejection fraction of 3035% with akinesis involving her anterior mid apical anterior and anteroseptal wall, anterolateral and apical segment.  She had restrictive physiology with grade 3 diastolic dysfunction and was also noted have mild aortic valve stenosis.  There was severe left atrial dilatation with left atrial volume at 54.6 mL's per meter squared.  It was very difficult to get a history from the patient, despite having an interpreter.  Patient had been inadvertently taking both Lopressor and carvedilol.  We'll discontinue Lopressor and continue with carvedilol.  Her cha2ds2vasc score is elevated at 6 and if GI status stabilizes anticoagulation would be recommended to reduce significant curative stroke risk 9.7% per year.  She has stage IV chronic kidney disease with an estimated GFR at 28.  Will continue with IV diuresis and carvedilol.  With her  chronic kidney disease.  She may not be able to tolerate Ace or are therapy, but may be able to initiate nitrates/hydralazine combination.  We will follow the patient with you.  Troy Sine, MD, [facility] 09/18/2015 5:42 PM

## 2015-09-18 NOTE — Progress Notes (Signed)
Completed assessment and MD bedside rounding with Dr. Rockne Menghini using the interpreter laptop. The person who assisted [place] was Aidea, number [id number]. The language used was [place]. The patient denied pain during our rounding. She also denied dizziness. She stated she had a BM and denied any dark or bloody stools, stating that it was the "healthiest bowel movement she has seen in a while."   It was discussed with MD that the patient has had pauses with PVCs on telemetry of 3.09 seconds and 3.98 seconds  this am. Cardiology is to consult on patient. Will continue to monitor pt.   Othella Boyer [facility]

## 2015-09-18 NOTE — Progress Notes (Signed)
Progress Note    Lisa [name]  [id number] DOB: 1935-07-09  DOA: 09/17/2015 PCP: Colonel Bald, MD    Brief Narrative:   Lisa [name] is an 80 y.o. female with a PMH of hypertension, stage III CKD (baseline creatinine around 1 post (, chronic combined CHF (EF 40-45 percent by echo in 2016), bicuspid aortic valve, atrial fibrillation on anticoagulation, OSA managed with CPAP, and diabetes who was admitted 09/17/15 for evaluation of abdominal and chest pain associated with lightheadedness, orthopnea, and lower extremity edema. In the ED, she was found to have a troponin of 0.09. CT of her abdomen negative for acute abdominal pathology but did show bilateral pleural effusions suggestive of CHF as well as a small pericardial effusion. BNP was 2,503.1.  Assessment/Plan:   Principal Problem:   Acute on chronic combined systolic and diastolic heart failure ([place]) complicated by chest pain likely from demand ischemia with elevated troponins in the setting of worsening aortic stenosis Patient takes 40 mg of oral Lasix 3 times a day per medication report. On admission, she was placed on 40 mg of IV Lasix twice a day. No change in weight or significant diuresis noted, although the patient does say she feels a bit better. Will change Lasix to 60 mg IV twice a day. Troponin elevation likely secondary to demand ischemia. The patient was evaluated by cardiology service however the note has not been completed and therefore formal recommendations have not yet been ascertained. A 2-D echo was repeated which showed progression of aortic stenosis.  Active Problems:   Essential hypertension Continue Coreg. Diovan on hold secondary to acute kidney injury.    Acute kidney injury in the setting of CKD (chronic kidney disease) stage 3, GFR 15-29 ml/min (HCC) Baseline creatinine around 1. Current creatinine elevated over usual baseline values, suspect from decreased cardiac output due to heart  failure exacerbation. Monitor closely with diuresis.    OSA on CPAP Continue CPAP daily at bedtime.    Persistent atrial fibrillation (HCC) /pauses This patients CHA2DS2-VASc Score and unadjusted Ischemic Stroke Rate (% per year) is equal to 9.7 % stroke rate/year from a score of 6 (CHF, HTN, DM, Female, Age (2)Heart rate controlled on Coreg and digoxin. On Xarelto chronically, but this was held on admission due to the concerns for dark stools. Fecal occult blood testing is negative, so would resume blood thinners, but Xarelto contraindicated given renal failure. Will check creatinine again tomorrow, and if improved, resume Xarelto---otherwise she many need another option. Due to significant pauses on telemetry, I have reduced her Coreg to 12.5 mg twice a day.    Uncontrolled type 2 diabetes mellitus with circulatory disorder, with long-term current use of insulin (HCC) Currently being managed with Lantus 15 units daily as well as insulin sensitive SSI every 4 hours. CBGs 94-329.  We'll change to insulin resistant scale with 4 units of meal coverage.    Liver lesion Follow-up recommended. Once patient more medically stable, could proceed with inpatient CT or MRI or schedule as an outpatient non-urgently.    Leukocytosis No evidence of infection. Monitor.   Family Communication/Anticipated D/C date and plan/Code Status   DVT prophylaxis: SCDs ordered, Xarelto on hold while GI bleed is being ruled out & due to ARF. Code Status: Full Code.  Family Communication: No family at the bedside. Disposition Plan: Home in 24-48 hours if stable.   Medical Consultants:    Cardiology   Procedures:    2-D echocardiogram 09/17/15  Study  Conclusions  - Left ventricle: The cavity size was normal. There was severe   concentric hypertrophy. Systolic function was moderately to   severely reduced. The estimated ejection fraction was in the   range of 30% to 35%. There is akinesis of the    mid-apicalanteroseptal, anterolateral, inferoseptal, and apical   myocardium. Doppler parameters are consistent with a reversible   restrictive pattern, indicative of decreased left ventricular   diastolic compliance and/or increased left atrial pressure (grade   3 diastolic dysfunction). - Aortic valve: Trileaflet; moderately thickened, moderately   calcified leaflets. Valve mobility was restricted. There was mild   stenosis. Peak velocity (S): 283 cm/s. Mean gradient (S): 14 mm   Hg. - Mitral valve: Calcified annulus. Mildly thickened leaflets . - Left atrium: The atrium was severely dilated. Volume/bsa, ES   (1-plane Simpson&'s, A4C): 54.6 ml/m^2. - Right ventricle: The cavity size was mildly dilated. Wall   thickness was normal. - Pulmonary arteries: Systolic pressure was moderately increased.   PA peak pressure: 52 mm Hg (S).  Impressions:  - Since prior echocardiogram, aortic stenosis has advanced (prior   mean gradient 53mmHg).  Anti-Infectives:   None  Subjective:   Lisa [name] says she feels a bit better. She does not speak English and therefore a video interpreter was used (ID [id number]). She is not currently complaining of any abdominal or chest pains. She says she lives with her husband.  Objective:    Vitals:   09/18/15 0443 09/18/15 1052 09/18/15 1109 09/18/15 1430  BP: (!) 113/54 118/63 116/61 108/66  Pulse: (!) 56 65  66  Resp: 18   18  Temp: 98.4 F (36.9 C)   97.6 F (36.4 C)  TempSrc: Oral   Oral  SpO2: 95%   96%  Weight: 76.8 kg (169 lb 5 oz)     Height:        Intake/Output Summary (Last 24 hours) at 09/18/15 1620 Last data filed at 09/17/15 2318  Gross per 24 hour  Intake                0 ml  Output              350 ml  Net             -350 ml   Filed Weights   09/17/15 2126 09/18/15 0443  Weight: 76.8 kg (169 lb 6.4 oz) 76.8 kg (169 lb 5 oz)    Exam: General exam: Appears calm and comfortable.  Respiratory system: Rare basilar  crackles. Respiratory effort normal. Cardiovascular system: Heart sounds are irregular. No JVD,  rubs, gallops or clicks. No murmurs. Gastrointestinal system: Abdomen is nondistended, soft and nontender. No organomegaly or masses felt. Normal bowel sounds heard. Central nervous system: Alert and oriented. No focal neurological deficits. Extremities: No clubbing,  or cyanosis. No edema. Skin: No rashes, lesions or ulcers. Psychiatry: Judgement and insight appear normal. Mood & affect appropriate.   Data Reviewed:   I have personally reviewed following labs and imaging studies:  Labs: Basic Metabolic Panel:  Recent Labs Lab 09/17/15 1307 09/18/15 0601  NA 139 139  K 4.4 3.5  CL 105 101  CO2 26 27  GLUCOSE 71 117*  BUN 48* 48*  CREATININE 1.76* 1.71*  CALCIUM 8.8* 8.6*   GFR Estimated Creatinine Clearance: 25.8 mL/min (by C-G formula based on SCr of 1.71 mg/dL). Liver Function Tests:  Recent Labs Lab 09/17/15 1307  AST 28  ALT 23  ALKPHOS  85  BILITOT 1.0  PROT 7.1  ALBUMIN 3.6    Recent Labs Lab 09/17/15 1307  LIPASE 18   CBC:  Recent Labs Lab 09/17/15 1307  WBC 15.6*  HGB 11.4*  HCT 35.1*  MCV 82.8  PLT 279   Cardiac Enzymes:  Recent Labs Lab 09/17/15 1307 09/17/15 1937 09/18/15 0000 09/18/15 0601 09/18/15 1133  TROPONINI 0.09* 0.12* 0.17* 0.15* 0.14*   CBG:  Recent Labs Lab 09/17/15 2239 09/18/15 0438 09/18/15 0753 09/18/15 1149 09/18/15 1606  GLUCAP 176* 155* 94 329* 306*   Anemia work up:  Recent Labs  09/18/15 0601  VITAMINB12 229  FOLATE 19.5  FERRITIN 150  TIBC 301  IRON 38  RETICCTPCT 2.0   Sepsis Labs:  Recent Labs Lab 09/17/15 1307  WBC 15.6*    Microbiology No results found for this or any previous visit (from the past 240 hour(s)).  Radiology: Dg Abdomen Acute W/chest  Result Date: 09/17/2015 CLINICAL DATA:  Abdominal pain, chest pain EXAM: DG ABDOMEN ACUTE W/ 1V CHEST COMPARISON:  None. FINDINGS:  Central vascular congestion and mild perihilar interstitial prominence suspicious for mild interstitial edema. Small right pleural effusion with right basilar atelectasis or infiltrate. There is normal small bowel gas pattern. No evidence of free abdominal air. IMPRESSION: Central mild vascular congestion and mild perihilar interstitial prominence suspicious for mild interstitial edema. Small right pleural effusion with right basilar atelectasis or infiltrate. Normal small bowel gas pattern. Electronically Signed   By: Lahoma Crocker M.D.   On: 09/17/2015 14:54   Ct Renal Stone Study  Result Date: 09/17/2015 CLINICAL DATA:  Left lower abdominal pain and nausea. Congestive heart failure and chronic kidney disease stage 3. EXAM: CT ABDOMEN AND PELVIS WITHOUT CONTRAST TECHNIQUE: Multidetector CT imaging of the abdomen and pelvis was performed following the standard protocol without IV contrast. COMPARISON:  None. FINDINGS: Lower chest: Small bilateral pleural effusions and bibasilar atelectasis. Bibasilar interstitial prominence suspicious for pulmonary edema. Moderate cardiomegaly and small pericardial effusion. Aortic atherosclerosis and coronary artery calcification. Hepatobiliary: No mass visualized on this un-enhanced exam. Pancreas: No mass or inflammatory process identified on this un-enhanced exam. Spleen: Within normal limits in size. Adrenals/Urinary Tract: No evidence of urolithiasis or hydronephrosis. Unopacified urinary bladder is unremarkable in appearance. Stomach/Bowel: No evidence of obstruction, inflammatory process, or abnormal fluid collections. Normal appendix visualized. Vascular/Lymphatic: No pathologically enlarged lymph nodes. No evidence of abdominal aortic aneurysm. Aortic atherosclerosis noted. Reproductive: No mass or other significant abnormality. Other: None. Musculoskeletal:  No suspicious bone lesions identified. IMPRESSION: No acute findings within the abdomen or pelvis. Cardiomegaly,  bibasilar pulmonary interstitial prominence and small bilateral pleural effusions, suspicious for congestive heart failure. Small pericardial effusion. Aortic atherosclerosis and coronary artery calcification. Electronically Signed   By: Earle Gell M.D.   On: 09/17/2015 15:52   US Abdomen Limited Ruq  Result Date: 09/17/2015 CLINICAL DATA:  Epigastric abdominal pain. EXAM: US ABDOMEN LIMITED - RIGHT UPPER QUADRANT COMPARISON:  CT abdomen and pelvis 09/17/2015. Report of CT abdomen and pelvis with contrast 07/14/2008 and MRI abdomen 08/06/2007 FINDINGS: Gallbladder: Surgically absence Common bile duct: Diameter: Within normal limits following cholecystectomy and 11 mm. No obstructing mass lesion is present. Liver: A focal hyperechoic lesion is present at the inferior aspect of the right lobe of the liver measuring 3.3 x 1.9 x 2.9 cm. No other focal lesions are present. A right pleural effusion is noted. IMPRESSION: 1. No acute or focal lesion to explain the patient's abdominal pain. 2. **An incidental  finding of potential clinical significance has been found. 3.3 cm hyperechoic lesion at the inferior aspect of the right lobe of the liver was not previously reported. This is nonspecific and most likely represents a hemangioma. This lesion was not discretely seen on the noncontrast CT of the abdomen and pelvis. It was not described on previous CT or MRI from 2010 and 2009. Follow-up CT of the abdomen only or MRI without and with contrast is recommended for further evaluation.** Electronically Signed   By: San Morelle M.D.   On: 09/17/2015 18:19    Medications:   . carvedilol  12.5 mg Oral BID WC  . digoxin  0.125 mg Oral Daily  . furosemide  40 mg Intravenous BID  . guaiFENesin  600 mg Oral BID  . insulin aspart  0-9 Units Subcutaneous Q4H  . insulin glargine  15 Units Subcutaneous QHS  . rosuvastatin  10 mg Oral Daily  . sodium chloride flush  3 mL Intravenous Q12H   Continuous Infusions:     Time spent: 35 minutes.  The patient is medically complex with multiple co-morbidities and is at high risk for clinical deterioration and requires high complexity decision making.    LOS: 1 day   Daja Shuping  [facility] Pager [phone]. If unable to reach me by pager, please call my cell phone at [phone].  *Please refer to [email or web address], password [email or web address] to get updated schedule on who will round on this patient, as hospitalists switch teams weekly. If 7PM-7AM, please contact night-coverage at [email or web address], password [email or web address] for any overnight needs.  09/18/2015, 4:20 PM

## 2015-09-18 NOTE — Progress Notes (Signed)
  Echocardiogram 2D Echocardiogram with Definity has been performed.  Lisa [name] 09/18/2015, 11:19 AM

## 2015-09-19 LAB — CBC
HCT: 33.9 % — ABNORMAL LOW (ref 36.0–46.0)
Hemoglobin: 11 g/dL — ABNORMAL LOW (ref 12.0–15.0)
MCH: 26.5 pg (ref 26.0–34.0)
MCHC: 32.4 g/dL (ref 30.0–36.0)
MCV: 81.7 fL (ref 78.0–100.0)
PLATELETS: 315 10*3/uL (ref 150–400)
RBC: 4.15 MIL/uL (ref 3.87–5.11)
RDW: 15 % (ref 11.5–15.5)
WBC: 12.2 10*3/uL — ABNORMAL HIGH (ref 4.0–10.5)

## 2015-09-19 LAB — BASIC METABOLIC PANEL
ANION GAP: 10 (ref 5–15)
BUN: 53 mg/dL — ABNORMAL HIGH (ref 6–20)
CO2: 28 mmol/L (ref 22–32)
CREATININE: 1.76 mg/dL — AB (ref 0.44–1.00)
Calcium: 8.7 mg/dL — ABNORMAL LOW (ref 8.9–10.3)
Chloride: 103 mmol/L (ref 101–111)
GFR calc Af Amer: 31 mL/min — ABNORMAL LOW (ref >60)
GFR calc non Af Amer: 27 mL/min — ABNORMAL LOW (ref >60)
GLUCOSE: 105 mg/dL — AB (ref 65–99)
POTASSIUM: 3.4 mmol/L — AB (ref 3.5–5.1)
Sodium: 141 mmol/L (ref 135–145)

## 2015-09-19 LAB — GLUCOSE, CAPILLARY
Glucose-Capillary: 112 mg/dL — ABNORMAL HIGH (ref 65–99)
Glucose-Capillary: 233 mg/dL — ABNORMAL HIGH (ref 65–99)
Glucose-Capillary: 286 mg/dL — ABNORMAL HIGH (ref 65–99)
Glucose-Capillary: 246 mg/dL — ABNORMAL HIGH (ref 65–99)

## 2015-09-19 LAB — HEMOGLOBIN A1C
Hgb A1c MFr Bld: 8.3 % — ABNORMAL HIGH (ref 4.8–5.6)
MEAN PLASMA GLUCOSE: 192 mg/dL

## 2015-09-19 LAB — BRAIN NATRIURETIC PEPTIDE: B Natriuretic Peptide: 1562.3 pg/mL — ABNORMAL HIGH (ref 0.0–100.0)

## 2015-09-19 MED ORDER — POTASSIUM CHLORIDE CRYS ER 20 MEQ PO TBCR
40.0000 meq | EXTENDED_RELEASE_TABLET | Freq: Once | ORAL | Status: AC
  Administered 2015-09-19: 40 meq via ORAL
  Filled 2015-09-19: qty 2

## 2015-09-19 MED ORDER — HYDRALAZINE HCL 25 MG PO TABS
25.0000 mg | ORAL_TABLET | Freq: Three times a day (TID) | ORAL | Status: DC
  Administered 2015-09-19 – 2015-09-21 (×5): 25 mg via ORAL
  Filled 2015-09-19 (×5): qty 1

## 2015-09-19 NOTE — Progress Notes (Signed)
Interpreter [id number] was used to communicate with patient.

## 2015-09-19 NOTE — Progress Notes (Signed)
[facility] Progress Note  Patient: Lisa [name] [id number]   PCP: Colonel Bald, MD DOB: 03-28-1935   DOA: 09/17/2015   DOS: 09/19/2015   Date of Service: the patient was seen and examined on 09/19/2015  Subjective: Continues to complain of abdominal pain as well as fluid in the lung. No fever no chills. Nutrition: Tolerating oral diet  Brief hospital course: Lisa [name] is an 80 y.o. female with a PMH of hypertension, stage III CKD (baseline creatinine around 1 post (, chronic combined CHF (EF 40-45 percent by echo in 2016), bicuspid aortic valve, atrial fibrillation on anticoagulation, OSA managed with CPAP, and diabetes who was admitted 09/17/15 for evaluation of abdominal and chest pain associated with lightheadedness, orthopnea, and lower extremity edema. In the ED, she was found to have a troponin of 0.09. CT of her abdomen negative for acute abdominal pathology but did show bilateral pleural effusions suggestive of CHF as well as a small pericardial effusion. BNP was 2,503. Currently further plan is to continue titrating cardiac medication as well as diuresis.  Assessment and Plan: 1. Acute on chronic combined systolic and diastolic heart failure ([place]) Patient takes 40 mg of oral Lasix 3 times a day per medication report. On admission, she was placed on 40 mg of IV Lasix twice a day. No change in weight or significant diuresis noted, although the patient does say she feels a bit better.  Lasix to 60 mg IV twice a day. Troponin elevation likely secondary to demand ischemia. A 2-D echo was repeated which showed progression of aortic stenosis. Appreciate cardiac consultation  Active Problems:   Essential hypertension Continue Coreg. Diovan on hold secondary to acute kidney injury.    Acute kidney injury in the setting of CKD (chronic kidney disease) stage 3, GFR 15-29 ml/min (HCC) Baseline creatinine around 1. Current creatinine elevated over usual baseline  values, suspect from decreased cardiac output due to heart failure exacerbation. Monitor closely with diuresis.    OSA on CPAP Continue CPAP daily at bedtime.    Persistent atrial fibrillation (HCC) /pauses This patients CHA2DS2-VASc Score and unadjusted Ischemic Stroke Rate (% per year) is equal to 9.7 % stroke rate/year from a score of 6(CHF, HTN, DM, Female, Age (2)Heart rate controlled on Coreg and digoxin. On Xarelto chronically, but this was held on admission due to the concerns for dark stools. Fecal occult blood testing is negative, so would resume blood thinners, but Xarelto contraindicated given renal failure.  We'll discuss with cardiology regarding appropriate anticoagulation options.    Uncontrolled type 2 diabetes mellitus with circulatory disorder, with long-term current use of insulin (HCC) Currently being managed with Lantus and sliding scale insulin.    Liver lesion Follow-up recommended. Once patient more medically stable, could proceed with inpatient CT or MRI or schedule as an outpatient non-urgently.    Leukocytosis No evidence of infection. Monitor  Pain management: When necessary Tylenol Activity: Consulted physical therapy Bowel regimen: last BM 09/17/2015 Diet: Cardiac diet DVT Prophylaxis: subcutaneous Heparin  Advance goals of care discussion: Full code  Family Communication: NO family was present at bedside, at the time of interview.  Disposition:  Discharge to home. Expected discharge date: 09/21/2015, pending improvement in CHF  Consultants: Cardiology Procedures: None  Antibiotics: Anti-infectives    None        Intake/Output Summary (Last 24 hours) at 09/19/15 1829 Last data filed at 09/19/15 1638  Gross per 24 hour  Intake  120 ml  Output             1050 ml  Net             -930 ml   Filed Weights   09/17/15 2126 09/18/15 0443 09/19/15 0700  Weight: 76.8 kg (169 lb 6.4 oz) 76.8 kg (169 lb 5 oz) 75.5 kg (166 lb 7.2  oz)    Objective: Physical Exam: Vitals:   09/18/15 2151 09/19/15 0700 09/19/15 1450 09/19/15 1800  BP:  (!) 127/54 117/70 (!) 106/58  Pulse: (!) 59 67 63 60  Resp: 16 16 18    Temp:  98.3 F (36.8 C) 98.4 F (36.9 C)   TempSrc:  Oral Oral   SpO2: 95% 94% 96%   Weight:  75.5 kg (166 lb 7.2 oz)    Height:        General: Alert, Awake and Oriented to Time, Place and Person. Appear in moderate distress Eyes: PERRL, Conjunctiva normal ENT: Oral Mucosa clear moist. Neck: mild JVD, no Abnormal Mass Or lumps Cardiovascular: S1 and S2 Present, aortic systolic Murmur, Respiratory: Bilateral Air entry equal and Decreased, basal Crackles, no wheezes Abdomen: Bowel Sound [resent, Soft and no tenderness Skin: no redness, no Rash  Extremities: bilateral Pedal edema, no calf tenderness Neurologic: Grossly no focal neuro deficit. Bilaterally Equal motor strength  Data Reviewed: CBC:  Recent Labs Lab 09/17/15 1307 09/19/15 0547  WBC 15.6* 12.2*  HGB 11.4* 11.0*  HCT 35.1* 33.9*  MCV 82.8 81.7  PLT 279 [id number]   Basic Metabolic Panel:  Recent Labs Lab 09/17/15 1307 09/18/15 0601 09/19/15 0547  NA 139 139 141  K 4.4 3.5 3.4*  CL 105 101 103  CO2 26 27 28   GLUCOSE 71 117* 105*  BUN 48* 48* 53*  CREATININE 1.76* 1.71* 1.76*  CALCIUM 8.8* 8.6* 8.7*    Liver Function Tests:  Recent Labs Lab 09/17/15 1307  AST 28  ALT 23  ALKPHOS 85  BILITOT 1.0  PROT 7.1  ALBUMIN 3.6    Recent Labs Lab 09/17/15 1307  LIPASE 18   No results for input(s): AMMONIA in the last 168 hours. Coagulation Profile: No results for input(s): INR, PROTIME in the last 168 hours. Cardiac Enzymes:  Recent Labs Lab 09/17/15 1307 09/17/15 1937 09/18/15 0000 09/18/15 0601 09/18/15 1133  TROPONINI 0.09* 0.12* 0.17* 0.15* 0.14*   BNP (last 3 results) No results for input(s): PROBNP in the last 8760 hours.  CBG:  Recent Labs Lab 09/18/15 1606 09/18/15 2118 09/19/15 0734  09/19/15 1156 09/19/15 1641  GLUCAP 306* 126* 112* 233* 286*    Studies: No results found.   Scheduled Meds: . carvedilol  12.5 mg Oral BID WC  . furosemide  60 mg Intravenous BID  . guaiFENesin  600 mg Oral BID  . hydrALAZINE  25 mg Oral Q8H  . insulin aspart  0-20 Units Subcutaneous TID WC  . insulin aspart  0-5 Units Subcutaneous QHS  . insulin aspart  4 Units Subcutaneous TID WC  . insulin glargine  15 Units Subcutaneous QHS  . isosorbide mononitrate  15 mg Oral Daily  . rosuvastatin  10 mg Oral Daily  . sodium chloride flush  3 mL Intravenous Q12H   Continuous Infusions:  PRN Meds: sodium chloride, acetaminophen, ondansetron (ZOFRAN) IV, sodium chloride flush, zolpidem  Time spent: 30 minutes  Author: Berle Mull, MD [facility] Pager: [phone] 09/19/2015 6:29 PM  If 7PM-7AM, please contact night-coverage at [email or web address], password [email or web address]

## 2015-09-19 NOTE — Progress Notes (Signed)
serbian interpreter used [phone]

## 2015-09-19 NOTE — Progress Notes (Addendum)
Hospital Problem List     Principal Problem:   Acute on chronic combined systolic and diastolic heart failure (HCC) Active Problems:   Essential hypertension   OSA on CPAP   Persistent atrial fibrillation ([place])   Uncontrolled type 2 diabetes mellitus with circulatory disorder, with long-term current use of insulin (HCC)   Leukocytosis   Elevated troponin   Chest pain   Acute kidney injury ([place])   Sinus pause   Liver lesion     Patient Profile:   Primary Cardiologist: Dr. Oval Linsey  80 y.o. female w/ PMH of chronic combined systolic and diastolic CHF (EF [id number] by echo in 08/2014), persistent atrial fibrillation (on Xarelto), mild AS, OSA (on CPAP), CKD, and Type 2 DM who presented to [facility] ED on 09/17/2015 for abdominal pain and nausea. Cards consulted for CHF exacerbation.  Subjective   [place] Interpreter Used (ID: [id number])  Reports still having abdominal pain, improved since yesterday. Currently has a headache. Keeps repeating to "get the fluid off" of her.  Inpatient Medications    . carvedilol  12.5 mg Oral BID WC  . furosemide  60 mg Intravenous BID  . guaiFENesin  600 mg Oral BID  . hydrALAZINE  12.5 mg Oral Q8H  . insulin aspart  0-20 Units Subcutaneous TID WC  . insulin aspart  0-5 Units Subcutaneous QHS  . insulin aspart  4 Units Subcutaneous TID WC  . insulin glargine  15 Units Subcutaneous QHS  . isosorbide mononitrate  15 mg Oral Daily  . rosuvastatin  10 mg Oral Daily  . sodium chloride flush  3 mL Intravenous Q12H    Vital Signs    Vitals:   09/18/15 1430 09/18/15 2122 09/18/15 2151 09/19/15 0700  BP: 108/66 (!) 121/59  (!) 127/54  Pulse: 66 (!) 56 (!) 59 67  Resp: 18 18 16 16   Temp: 97.6 F (36.4 C) 98 F (36.7 C)  98.3 F (36.8 C)  TempSrc: Oral Oral  Oral  SpO2: 96% 95% 95% 94%  Weight:    166 lb 7.2 oz (75.5 kg)  Height:        Intake/Output Summary (Last 24 hours) at 09/19/15 1311 Last data filed at 09/19/15 0900  Gross per  24 hour  Intake              120 ml  Output             1300 ml  Net            -1180 ml   Filed Weights   09/17/15 2126 09/18/15 0443 09/19/15 0700  Weight: 169 lb 6.4 oz (76.8 kg) 169 lb 5 oz (76.8 kg) 166 lb 7.2 oz (75.5 kg)    Physical Exam    General: Well developed, well nourished, female appearing in no acute distress. Head: Normocephalic, atraumatic.  Neck: Supple without bruits, JVD not elevated. Lungs:  Resp regular and unlabored, mild rales at bases bilaterally. No wheezing. Heart: Irregularly irregular, S1, S2, no S3, S4, 2/6 SEM at RUSB Abdomen: Soft, non-tender, non-distended with normoactive bowel sounds. No hepatomegaly. No rebound/guarding. No obvious abdominal masses. Extremities: No clubbing, cyanosis, or edema. Distal pedal pulses are 2+ bilaterally. Neuro: Alert and oriented X 3. Moves all extremities spontaneously. Psych: Normal affect.  Labs    CBC  Recent Labs  09/17/15 1307 09/19/15 0547  WBC 15.6* 12.2*  HGB 11.4* 11.0*  HCT 35.1* 33.9*  MCV 82.8 81.7  PLT 279 315  Basic Metabolic Panel  Recent Labs  09/18/15 0601 09/19/15 0547  NA 139 141  K 3.5 3.4*  CL 101 103  CO2 27 28  GLUCOSE 117* 105*  BUN 48* 53*  CREATININE 1.71* 1.76*  CALCIUM 8.6* 8.7*   Liver Function Tests  Recent Labs  09/17/15 1307  AST 28  ALT 23  ALKPHOS 85  BILITOT 1.0  PROT 7.1  ALBUMIN 3.6    Recent Labs  09/17/15 1307  LIPASE 18   Cardiac Enzymes  Recent Labs  09/18/15 0000 09/18/15 0601 09/18/15 1133  TROPONINI 0.17* 0.15* 0.14*   BNP Invalid input(s): POCBNP D-Dimer No results for input(s): DDIMER in the last 72 hours. Hemoglobin A1C  Recent Labs  09/18/15 0000  HGBA1C 8.3*   Fasting Lipid Panel No results for input(s): CHOL, HDL, LDLCALC, TRIG, CHOLHDL, LDLDIRECT in the last 72 hours. Thyroid Function Tests No results for input(s): TSH, T4TOTAL, T3FREE, THYROIDAB in the last 72 hours.  Invalid input(s): FREET3  Telemetry     Atrial fibrillation, HR in 70's - 80's. So significant pauses.   ECG    No new tracings.   Cardiac Studies and Radiology    Dg Abdomen Acute W/chest  Result Date: 09/17/2015 CLINICAL DATA:  Abdominal pain, chest pain EXAM: DG ABDOMEN ACUTE W/ 1V CHEST COMPARISON:  None. FINDINGS: Central vascular congestion and mild perihilar interstitial prominence suspicious for mild interstitial edema. Small right pleural effusion with right basilar atelectasis or infiltrate. There is normal small bowel gas pattern. No evidence of free abdominal air. IMPRESSION: Central mild vascular congestion and mild perihilar interstitial prominence suspicious for mild interstitial edema. Small right pleural effusion with right basilar atelectasis or infiltrate. Normal small bowel gas pattern. Electronically Signed   By: Lahoma Crocker M.D.   On: 09/17/2015 14:54   Ct Renal Stone Study  Result Date: 09/17/2015 CLINICAL DATA:  Left lower abdominal pain and nausea. Congestive heart failure and chronic kidney disease stage 3. EXAM: CT ABDOMEN AND PELVIS WITHOUT CONTRAST TECHNIQUE: Multidetector CT imaging of the abdomen and pelvis was performed following the standard protocol without IV contrast. COMPARISON:  None. FINDINGS: Lower chest: Small bilateral pleural effusions and bibasilar atelectasis. Bibasilar interstitial prominence suspicious for pulmonary edema. Moderate cardiomegaly and small pericardial effusion. Aortic atherosclerosis and coronary artery calcification. Hepatobiliary: No mass visualized on this un-enhanced exam. Pancreas: No mass or inflammatory process identified on this un-enhanced exam. Spleen: Within normal limits in size. Adrenals/Urinary Tract: No evidence of urolithiasis or hydronephrosis. Unopacified urinary bladder is unremarkable in appearance. Stomach/Bowel: No evidence of obstruction, inflammatory process, or abnormal fluid collections. Normal appendix visualized. Vascular/Lymphatic: No  pathologically enlarged lymph nodes. No evidence of abdominal aortic aneurysm. Aortic atherosclerosis noted. Reproductive: No mass or other significant abnormality. Other: None. Musculoskeletal:  No suspicious bone lesions identified. IMPRESSION: No acute findings within the abdomen or pelvis. Cardiomegaly, bibasilar pulmonary interstitial prominence and small bilateral pleural effusions, suspicious for congestive heart failure. Small pericardial effusion. Aortic atherosclerosis and coronary artery calcification. Electronically Signed   By: Earle Gell M.D.   On: 09/17/2015 15:52   US Abdomen Limited Ruq  Result Date: 09/17/2015 CLINICAL DATA:  Epigastric abdominal pain. EXAM: US ABDOMEN LIMITED - RIGHT UPPER QUADRANT COMPARISON:  CT abdomen and pelvis 09/17/2015. Report of CT abdomen and pelvis with contrast 07/14/2008 and MRI abdomen 08/06/2007 FINDINGS: Gallbladder: Surgically absence Common bile duct: Diameter: Within normal limits following cholecystectomy and 11 mm. No obstructing mass lesion is present. Liver: A focal hyperechoic lesion is  present at the inferior aspect of the right lobe of the liver measuring 3.3 x 1.9 x 2.9 cm. No other focal lesions are present. A right pleural effusion is noted. IMPRESSION: 1. No acute or focal lesion to explain the patient's abdominal pain. 2. **An incidental finding of potential clinical significance has been found. 3.3 cm hyperechoic lesion at the inferior aspect of the right lobe of the liver was not previously reported. This is nonspecific and most likely represents a hemangioma. This lesion was not discretely seen on the noncontrast CT of the abdomen and pelvis. It was not described on previous CT or MRI from 2010 and 2009. Follow-up CT of the abdomen only or MRI without and with contrast is recommended for further evaluation.** Electronically Signed   By: San Morelle M.D.   On: 09/17/2015 18:19    Echocardiogram: 09/18/2015 Study Conclusions  -  Left ventricle: The cavity size was normal. There was severe   concentric hypertrophy. Systolic function was moderately to   severely reduced. The estimated ejection fraction was in the   range of 30% to 35%. There is akinesis of the   mid-apicalanteroseptal, anterolateral, inferoseptal, and apical   myocardium. Doppler parameters are consistent with a reversible   restrictive pattern, indicative of decreased left ventricular   diastolic compliance and/or increased left atrial pressure (grade   3 diastolic dysfunction). - Aortic valve: Trileaflet; moderately thickened, moderately   calcified leaflets. Valve mobility was restricted. There was mild   stenosis. Peak velocity (S): 283 cm/s. Mean gradient (S): 14 mm   Hg. - Mitral valve: Calcified annulus. Mildly thickened leaflets . - Left atrium: The atrium was severely dilated. Volume/bsa, ES   (1-plane Simpson&'s, A4C): 54.6 ml/m^2. - Right ventricle: The cavity size was mildly dilated. Wall   thickness was normal. - Pulmonary arteries: Systolic pressure was moderately increased.   PA peak pressure: 52 mm Hg (S).  Impressions:  - Since prior echocardiogram, aortic stenosis has advanced (prior   mean gradient 78mmHg).  Assessment & Plan    1. Acute on Chronic Combined Systolic and Diastolic CHF - echo in [id number] showed EF of 40-45%. Repeat echo this admission shows EF of 30% to 35%. There is akinesis of the mid-apicalanteroseptal, anterolateral, inferoseptal, and apical myocardium. Reports having an MI at [facility] last month. Need to obtain these records. - BNP 1492 on admission and CXR  showing central mild vascular congestion and mild perihilar interstitial prominence suspicious for mild interstitial edema. Small right pleural effusion with right basilar atelectasis or infiltrate.  - started on IV Lasix 60mg  BID with a recorded output of -1.7L thus far. Weight at 166 lbs, lower than baseline of 177 lbs. Continue to  obtain daily weights and strict I&O's. Does not appear overly volume overloaded (I am concerned her abdominal pain will not be fully relieved once diuresis is complete). - continue Coreg 12.5mg  BID. ARB currently held with AKI. Dig level up to 1.6 (has been discontinued).Started on Imdur 15mg  daily and Hydralazine 12.5mg  Q8H. BP improved.  2. Chronic Atrial Fibrillation - was taking both Lopressor and Coreg PTA instead of Coreg alone. Pauses up to 2.1 seconds noted on telemetry, likely secondary to taking multiple AV nodal blocking agents. No recurrent pauses noted. Continue Coreg for rate-control. - This patients CHA2DS2-VASc Score and unadjusted Ischemic Stroke Rate (% per year) is equal to 9.7 % stroke rate/year from a score of 6 (CHF, HTN, DM, Female, Age (2)). On Xarelto PTA, held due  to patient reporting dark tarry stools. Occult blood negative and Hgb stable. Would resume with her significant stroke risk once workup for possible GI bleed complete.   3. Elevated Troponin - Cyclic troponin values have been 0.12, 0.17, 0.15, and 0.14. Flat trend, likely secondary to CHF exacerbation and AKI.  - need to obtain recent records from [facility].  4. AKI - creatinine 1.76 (was 0.99 two months ago).   5. Abdominal Pain - Abdominal US with no acute or focal lesion to explain the patient's abdominal pain but an incidental finding of a 3.3 cm hyperechoic lesion at the inferior aspect of the right lobe of the liver was noted, likely representing a hemangioma. F/U CT or MRI recommended. - per admitting team  6. Hypokalemia - K+ 3.4 this AM - will replace.  Signed, Erma Heritage , PA-C 1:11 PM 09/19/2015 Pager: [phone]  Patient seen and examined. Agree with assessment and plan. Good diuresis with [id number] since admission. BP 127/54. Cr 1.76 today.  Will titrate hydralazine to 25 mg q 8 hrs and tomorrow increase imdur to 30 mg if BP allows. Flat troponin curve c/w demand ischemia from  CHF. Check BNP in am.   Troy Sine, MD, [facility] 09/19/2015 2:19 PM

## 2015-09-19 NOTE — Progress Notes (Signed)
Inpatient Diabetes Program Recommendations  AACE/ADA: New Consensus Statement on Inpatient Glycemic Control (2015)  Target Ranges:  Prepandial:   less than 140 mg/dL      Peak postprandial:   less than 180 mg/dL (1-2 hours)      Critically ill patients:  140 - 180 mg/dL   Results for Lisa [name], Lisa [name] (MRN [id number]) as of 09/19/2015 10:33  Ref. Range 09/18/2015 07:53 09/18/2015 11:49 09/18/2015 16:06 09/18/2015 21:18  Glucose-Capillary Latest Ref Range: 65 - 99 mg/dL 94 329 (H) 306 (H) 126 (H)   Results for Lisa [name], Lisa [name] (MRN [id number]) as of 09/19/2015 12:57  Ref. Range 09/19/2015 07:34 09/19/2015 11:56  Glucose-Capillary Latest Ref Range: 65 - 99 mg/dL 112 (H) 233 (H)    Admit with: "80 y.o. female with medical history significant of HTN, CKD, Chronic combined CHF,  A. Fib on AC,  heart murmur, OSA with CPAP, DM being admitted with what seems to be acute on chronic CHF with elevated troponin intermittent chest pain"  History: DM, CKD, CHF  Home DM Meds: (taken from Chart Review of Dr. Arman Filter notes from [facility] Endocrinology appt 09/14/15)       - Metformin 500 mg 2x a day                 - Lantus 30 units at bedtime                 - Novolog 10-15 min before each meal - 3x a day                         - 10 units before a smaller meal                         - 15 units before a regular meal                                        If sugars 80-100 before a meal, take only 8 units                                        If sugars 60-80 before a meal, take only 6 units                                                    If sugars <60 before a meal, do not take any insulin with that meal  Current Insulin Orders: Lantus 15 units QHS      Novolog Resistant Correction Scale/ SSI (0-20 units) TID AC + HS      Novolog 4 units tidwc      -Patient saw her Endocrinologist Dr. Philemon [facility] Endocrinology) on 09/14/15.  Patient was advised to take her insulin as written above.   Per Dr. Arman Filter notes, patient was not following the regimen she had prescribed previously for the patient.  Was counseled extensively about how to take her insulin at home.  -A1c shows improvement since June.  Was 12.2%.  Now down to 8.3%.  -Has follow-up appointment with Dr. Cruzita Lederer on 11/08/15 and also has appointment with Diabetes  Educator as an outpatient on 09/25/15.     MD- Please consider starting Novolog Meal Coverage for this patient:  Novolog 5 units tid with meals to start (hold if pt eats <50% of meal)    --Will follow patient during hospitalization--  Wyn Quaker RN, MSN, CDE Diabetes Coordinator Inpatient Glycemic Control Team Team Pager: [phone] (8a-5p)

## 2015-09-19 NOTE — Progress Notes (Signed)
Showed mask to patient and asked if she was ready for CPAP. Patient shook head and push cpap mask away. Patient rolled over to go back to sleep. RT will continue to monitor and check with patient again.

## 2015-09-20 ENCOUNTER — Inpatient Hospital Stay (HOSPITAL_COMMUNITY): Payer: Medicare Other

## 2015-09-20 LAB — BASIC METABOLIC PANEL
ANION GAP: 11 (ref 5–15)
BUN: 51 mg/dL — AB (ref 6–20)
CHLORIDE: 101 mmol/L (ref 101–111)
CO2: 28 mmol/L (ref 22–32)
Calcium: 9.1 mg/dL (ref 8.9–10.3)
Creatinine, Ser: 1.73 mg/dL — ABNORMAL HIGH (ref 0.44–1.00)
GFR calc Af Amer: 32 mL/min — ABNORMAL LOW (ref >60)
GFR, EST NON AFRICAN AMERICAN: 27 mL/min — AB (ref >60)
Glucose, Bld: 214 mg/dL — ABNORMAL HIGH (ref 65–99)
POTASSIUM: 3.9 mmol/L (ref 3.5–5.1)
SODIUM: 140 mmol/L (ref 135–145)

## 2015-09-20 LAB — CBC
HEMATOCRIT: 34.2 % — AB (ref 36.0–46.0)
HEMOGLOBIN: 11.2 g/dL — AB (ref 12.0–15.0)
MCH: 26.3 pg (ref 26.0–34.0)
MCHC: 32.7 g/dL (ref 30.0–36.0)
MCV: 80.3 fL (ref 78.0–100.0)
Platelets: 248 10*3/uL (ref 150–400)
RBC: 4.26 MIL/uL (ref 3.87–5.11)
RDW: 14.8 % (ref 11.5–15.5)
WBC: 12.5 10*3/uL — AB (ref 4.0–10.5)

## 2015-09-20 LAB — GLUCOSE, CAPILLARY
GLUCOSE-CAPILLARY: 272 mg/dL — AB (ref 65–99)
GLUCOSE-CAPILLARY: 237 mg/dL — AB (ref 65–99)
GLUCOSE-CAPILLARY: 230 mg/dL — AB (ref 65–99)
Glucose-Capillary: 177 mg/dL — ABNORMAL HIGH (ref 65–99)

## 2015-09-20 LAB — PROTIME-INR
INR: 1.13
PROTHROMBIN TIME: 14.6 s (ref 11.4–15.2)

## 2015-09-20 LAB — MAGNESIUM: MAGNESIUM: 1.8 mg/dL (ref 1.7–2.4)

## 2015-09-20 LAB — BRAIN NATRIURETIC PEPTIDE: B NATRIURETIC PEPTIDE 5: 1971.9 pg/mL — AB (ref 0.0–100.0)

## 2015-09-20 MED ORDER — RIVAROXABAN 15 MG PO TABS
15.0000 mg | ORAL_TABLET | Freq: Every day | ORAL | Status: DC
  Administered 2015-09-20 – 2015-09-25 (×6): 15 mg via ORAL
  Filled 2015-09-20 (×6): qty 1

## 2015-09-20 MED ORDER — ISOSORBIDE MONONITRATE ER 30 MG PO TB24
30.0000 mg | ORAL_TABLET | Freq: Every day | ORAL | Status: DC
  Administered 2015-09-21 – 2015-09-25 (×5): 30 mg via ORAL
  Filled 2015-09-20 (×6): qty 1

## 2015-09-20 MED ORDER — INSULIN GLARGINE 100 UNIT/ML ~~LOC~~ SOLN
30.0000 [IU] | Freq: Every day | SUBCUTANEOUS | Status: DC
  Administered 2015-09-20 – 2015-09-21 (×2): 30 [IU] via SUBCUTANEOUS
  Filled 2015-09-20 (×3): qty 0.3

## 2015-09-20 NOTE — Progress Notes (Signed)
Inpatient Diabetes Program Recommendations  AACE/ADA: New Consensus Statement on Inpatient Glycemic Control (2015)  Target Ranges:  Prepandial:   less than 140 mg/dL      Peak postprandial:   less than 180 mg/dL (1-2 hours)      Critically ill patients:  140 - 180 mg/dL   Results for Lisa [name], Lisa [name] (MRN [id number]) as of 09/20/2015 08:20  Ref. Range 09/19/2015 07:34 09/19/2015 11:56 09/19/2015 16:41 09/19/2015 21:38  Glucose-Capillary Latest Ref Range: 65 - 99 mg/dL 112 (H) 233 (H) 286 (H) 246 (H)   Results for Lisa [name], Lisa [name] (MRN [id number]) as of 09/20/2015 08:20  Ref. Range 09/20/2015 05:50  Glucose Latest Ref Range: 65 - 99 mg/dL 214 (H)    Home DM Meds: (taken from Chart Review of Dr. Arman Filter notes from [facility] Endocrinology appt 09/14/15)                                                 - Metformin 500 mg 2x a day                   - Lantus 30 unitsat bedtime                   - Novolog 10-15 min before each meal - 3x a day                           - 10 units before a smaller meal                           - 15 units before a regular meal                                         If sugars 80-100 before a meal, take only 8 units                                         If sugars 60-80 before a meal, take only 6 units                                                     If sugars <60 before a meal, do not take any insulin with that meal  Current Insulin Orders: Lantus 15 units QHS                                       Novolog Resistant Correction Scale/ SSI (0-20 units) TID AC + HS                                       Novolog 4 units tidwc       MD- Please consider the following in-hospital insulin adjustments:  1. Increase Lantus to 20 units QHS  2. Increase Novolog Meal Coverage to: Novolog 8 units tid with meals     --Will follow patient during hospitalization--  Wyn Quaker RN, MSN, CDE Diabetes Coordinator Inpatient Glycemic Control Team Team  Pager: [phone] (8a-5p)

## 2015-09-20 NOTE — Progress Notes (Signed)
ANTICOAGULATION CONSULT NOTE - Initial Consult  Pharmacy Consult for xarelto Indication: atrial fibrillation  Allergies  Allergen Reactions  . Lisinopril Cough    Patient Measurements: Height: 5\' 1"  (154.9 cm) Weight: 161 lb 3.2 oz (73.1 kg) IBW/kg (Calculated) : 47.8 Heparin Dosing Weight:   Vital Signs: Temp: 98.2 F (36.8 C) (08/17 0440) Temp Source: Oral (08/17 0440) BP: 137/72 (08/17 0440) Pulse Rate: 69 (08/17 0440)  Labs:  Recent Labs  09/17/15 1307  09/18/15 0000 09/18/15 0601 09/18/15 1133 09/19/15 0547 09/20/15 0550  HGB 11.4*  --   --   --   --  11.0* 11.2*  HCT 35.1*  --   --   --   --  33.9* 34.2*  PLT 279  --   --   --   --  315 248  LABPROT  --   --   --   --   --   --  14.6  INR  --   --   --   --   --   --  1.13  CREATININE 1.76*  --   --  1.71*  --  1.76* 1.73*  TROPONINI 0.09*  < > 0.17* 0.15* 0.14*  --   --   < > = values in this interval not displayed.  Estimated Creatinine Clearance: 24.9 mL/min (by C-G formula based on SCr of 1.73 mg/dL).   Medical History: Past Medical History:  Diagnosis Date  . Anemia   . Carotid arterial disease ([place])   . Chronic combined systolic and diastolic CHF (congestive heart failure) ([place])    a. 08/2014 Echo: EF 40-45%, no rwma, bicuspid AoV, mild AS, mod dil Asc Ao, PASP 48mmHg.  . CKD (chronic kidney disease), stage III   . Diabetes mellitus without complication ([place])   . Essential hypertension   . Mild aortic stenosis    a. 08/2014 Echo: mild AS, bicuspid AoV.  . OSA on CPAP   . Osteoarthritis of both ankles   . Persistent atrial fibrillation ([place])    a. Dx 08/2013, CHA2DS2VASc=5-->Xarelto.  . Vitamin D deficiency     Assessment: Patient's a 80 y.o F with hx CKD 3 and afib (CHADSVASC 6) on xarelto 20 mg daily  PTA, presented to the ED on 8/14 with c/o abd pain and dark stools.  Xarelto was held on admission for r/o GIB.  Hemeoccult (-).  To resume xarelto on 8/17.  - scr 1.73 (crcl~31 with TBW)     Plan:  - xarelto 15 mg PO daily (adjusted for renal funct) - monitor for s/s bleeding  Aldahir Litaker P 09/20/2015,10:22 AM

## 2015-09-20 NOTE — Progress Notes (Signed)
Hospital Problem List     Principal Problem:   Acute on chronic combined systolic and diastolic heart failure (HCC) Active Problems:   Essential hypertension   OSA on CPAP   Persistent atrial fibrillation ([place])   Uncontrolled type 2 diabetes mellitus with circulatory disorder, with long-term current use of insulin (HCC)   Leukocytosis   Elevated troponin   Chest pain   Acute kidney injury ([place])   Sinus pause   Liver lesion     Patient Profile:   Primary Cardiologist: Dr. Oval Linsey  80 y.o.female w/ PMH of chronic combined systolic and diastolic CHF (EF [id number] by echo in 08/2014), persistent atrial fibrillation (on Xarelto), mild AS, OSA (on CPAP), CKD, and Type 2 DM who presented to [facility] ED on 09/17/2015 for abdominal pain and nausea. Cards consulted for CHF exacerbation.  Subjective   [place] Interpreter Used [phone]  Saying she is having abdominal pain which she thinks is related to fluid. Denies any dyspnea, chest discomfort, or palpitations.   Inpatient Medications    . carvedilol  12.5 mg Oral BID WC  . furosemide  60 mg Intravenous BID  . guaiFENesin  600 mg Oral BID  . hydrALAZINE  25 mg Oral Q8H  . insulin aspart  0-20 Units Subcutaneous TID WC  . insulin aspart  0-5 Units Subcutaneous QHS  . insulin aspart  4 Units Subcutaneous TID WC  . insulin glargine  30 Units Subcutaneous QHS  . isosorbide mononitrate  15 mg Oral Daily  . rivaroxaban  15 mg Oral Q lunch  . rosuvastatin  10 mg Oral Daily  . sodium chloride flush  3 mL Intravenous Q12H    Vital Signs    Vitals:   09/19/15 1450 09/19/15 1800 09/19/15 2004 09/20/15 0440  BP: 117/70 (!) 106/58 122/82 137/72  Pulse: 63 60 81 69  Resp: 18  20 20   Temp: 98.4 F (36.9 C)  98.4 F (36.9 C) 98.2 F (36.8 C)  TempSrc: Oral  Oral Oral  SpO2: 96%  98% 95%  Weight:    161 lb 3.2 oz (73.1 kg)  Height:        Intake/Output Summary (Last 24 hours) at 09/20/15 1140 Last data filed at 09/20/15  1058  Gross per 24 hour  Intake              360 ml  Output             1900 ml  Net            -1540 ml   Filed Weights   09/18/15 0443 09/19/15 0700 09/20/15 0440  Weight: 169 lb 5 oz (76.8 kg) 166 lb 7.2 oz (75.5 kg) 161 lb 3.2 oz (73.1 kg)    Physical Exam    General: Well developed, well nourished, female appearing in no acute distress. Head: Normocephalic, atraumatic.  Neck: Supple without bruits, JVD not elevated. Lungs:  Resp regular and unlabored, mild rales at bases. Heart: Irregularly irregular, S1, S2, no S3, S4, 2/6 SEM at RUSB; no rub. Abdomen: Soft, non-tender, non-distended with normoactive bowel sounds. No hepatomegaly. No rebound/guarding. No obvious abdominal masses. Extremities: No clubbing, cyanosis, or edema. Distal pedal pulses are 2+ bilaterally. Neuro: Alert and oriented X 3. Moves all extremities spontaneously. Psych: Normal affect.  Labs    CBC  Recent Labs  09/19/15 0547 09/20/15 0550  WBC 12.2* 12.5*  HGB 11.0* 11.2*  HCT 33.9* 34.2*  MCV 81.7 80.3  PLT  315 Q000111Q   Basic Metabolic Panel  Recent Labs  09/19/15 0547 09/20/15 0550  NA 141 140  K 3.4* 3.9  CL 103 101  CO2 28 28  GLUCOSE 105* 214*  BUN 53* 51*  CREATININE 1.76* 1.73*  CALCIUM 8.7* 9.1  MG  --  1.8   Liver Function Tests  Recent Labs  09/17/15 1307  AST 28  ALT 23  ALKPHOS 85  BILITOT 1.0  PROT 7.1  ALBUMIN 3.6    Recent Labs  09/17/15 1307  LIPASE 18   Cardiac Enzymes  Recent Labs  09/18/15 0000 09/18/15 0601 09/18/15 1133  TROPONINI 0.17* 0.15* 0.14*   BNP Invalid input(s): POCBNP   BNP (last 3 results)  Recent Labs  09/18/15 0601 09/19/15 0547 09/20/15 0550  BNP 2,503.1* 1,562.3* 1,971.9*    ProBNP (last 3 results) No results for input(s): PROBNP in the last 8760 hours.  D-Dimer No results for input(s): DDIMER in the last 72 hours. Hemoglobin A1C  Recent Labs  09/18/15 0000  HGBA1C 8.3*   Fasting Lipid Panel No results  for input(s): CHOL, HDL, LDLCALC, TRIG, CHOLHDL, LDLDIRECT in the last 72 hours. Thyroid Function Tests No results for input(s): TSH, T4TOTAL, T3FREE, THYROIDAB in the last 72 hours.  Invalid input(s): FREET3  Telemetry    Atrial fibrillation, HR in 60's - 70's. 1.9 second pause on telemetry.  ECG    No new tracings.   Cardiac Studies and Radiology    Dg Abdomen Acute W/chest  Result Date: 09/17/2015 CLINICAL DATA:  Abdominal pain, chest pain EXAM: DG ABDOMEN ACUTE W/ 1V CHEST COMPARISON:  None. FINDINGS: Central vascular congestion and mild perihilar interstitial prominence suspicious for mild interstitial edema. Small right pleural effusion with right basilar atelectasis or infiltrate. There is normal small bowel gas pattern. No evidence of free abdominal air. IMPRESSION: Central mild vascular congestion and mild perihilar interstitial prominence suspicious for mild interstitial edema. Small right pleural effusion with right basilar atelectasis or infiltrate. Normal small bowel gas pattern. Electronically Signed   By: Lahoma Crocker M.D.   On: 09/17/2015 14:54   Ct Renal Stone Study  Result Date: 09/17/2015 CLINICAL DATA:  Left lower abdominal pain and nausea. Congestive heart failure and chronic kidney disease stage 3. EXAM: CT ABDOMEN AND PELVIS WITHOUT CONTRAST TECHNIQUE: Multidetector CT imaging of the abdomen and pelvis was performed following the standard protocol without IV contrast. COMPARISON:  None. FINDINGS: Lower chest: Small bilateral pleural effusions and bibasilar atelectasis. Bibasilar interstitial prominence suspicious for pulmonary edema. Moderate cardiomegaly and small pericardial effusion. Aortic atherosclerosis and coronary artery calcification. Hepatobiliary: No mass visualized on this un-enhanced exam. Pancreas: No mass or inflammatory process identified on this un-enhanced exam. Spleen: Within normal limits in size. Adrenals/Urinary Tract: No evidence of urolithiasis or  hydronephrosis. Unopacified urinary bladder is unremarkable in appearance. Stomach/Bowel: No evidence of obstruction, inflammatory process, or abnormal fluid collections. Normal appendix visualized. Vascular/Lymphatic: No pathologically enlarged lymph nodes. No evidence of abdominal aortic aneurysm. Aortic atherosclerosis noted. Reproductive: No mass or other significant abnormality. Other: None. Musculoskeletal:  No suspicious bone lesions identified. IMPRESSION: No acute findings within the abdomen or pelvis. Cardiomegaly, bibasilar pulmonary interstitial prominence and small bilateral pleural effusions, suspicious for congestive heart failure. Small pericardial effusion. Aortic atherosclerosis and coronary artery calcification. Electronically Signed   By: Earle Gell M.D.   On: 09/17/2015 15:52   US Abdomen Limited Ruq  Result Date: 09/17/2015 CLINICAL DATA:  Epigastric abdominal pain. EXAM: US ABDOMEN LIMITED - RIGHT  UPPER QUADRANT COMPARISON:  CT abdomen and pelvis 09/17/2015. Report of CT abdomen and pelvis with contrast 07/14/2008 and MRI abdomen 08/06/2007 FINDINGS: Gallbladder: Surgically absence Common bile duct: Diameter: Within normal limits following cholecystectomy and 11 mm. No obstructing mass lesion is present. Liver: A focal hyperechoic lesion is present at the inferior aspect of the right lobe of the liver measuring 3.3 x 1.9 x 2.9 cm. No other focal lesions are present. A right pleural effusion is noted. IMPRESSION: 1. No acute or focal lesion to explain the patient's abdominal pain. 2. **An incidental finding of potential clinical significance has been found. 3.3 cm hyperechoic lesion at the inferior aspect of the right lobe of the liver was not previously reported. This is nonspecific and most likely represents a hemangioma. This lesion was not discretely seen on the noncontrast CT of the abdomen and pelvis. It was not described on previous CT or MRI from 2010 and 2009. Follow-up CT of the  abdomen only or MRI without and with contrast is recommended for further evaluation.** Electronically Signed   By: San Morelle M.D.   On: 09/17/2015 18:19    Echocardiogram: 09/18/2015 Study Conclusions  - Left ventricle: The cavity size was normal. There was severe concentric hypertrophy. Systolic function was moderately to severely reduced. The estimated ejection fraction was in the range of 30% to 35%. There is akinesis of the mid-apicalanteroseptal, anterolateral, inferoseptal, and apical myocardium. Doppler parameters are consistent with a reversible restrictive pattern, indicative of decreased left ventricular diastolic compliance and/or increased left atrial pressure (grade 3 diastolic dysfunction). - Aortic valve: Trileaflet; moderately thickened, moderately calcified leaflets. Valve mobility was restricted. There was mild stenosis. Peak velocity (S): 283 cm/s. Mean gradient (S): 14 mm Hg. - Mitral valve: Calcified annulus. Mildly thickened leaflets . - Left atrium: The atrium was severely dilated. Volume/bsa, ES (1-plane Simpson&'s, A4C): 54.6 ml/m^2. - Right ventricle: The cavity size was mildly dilated. Wall thickness was normal. - Pulmonary arteries: Systolic pressure was moderately increased. PA peak pressure: 52 mm Hg (S).  Impressions:  - Since prior echocardiogram, aortic stenosis has advanced (prior mean gradient 40mmHg).  Assessment & Plan    1. Acute on Chronic Combined Systolic and Diastolic CHF - echo in Q000111Q showed EF of 40-45%. Repeat echo this admission shows EF of 30% to 35%. There is akinesis of the mid-apicalanteroseptal, anterolateral, inferoseptal, and apical myocardium. Reports having an MI at [facility] last month. Need to obtain these records. - BNP 1492 on admission and CXR showing central mild vascular congestion and mild perihilar interstitial prominence suspicious for mild interstitial  edema. Small right pleural effusion with right basilar atelectasis or infiltrate.  - started on IV Lasix 60mg  BID with a recorded output of -3.2L thus far. Weight at 161 lbs, down 8 pounds since admission. Continue to obtain daily weights and strict I&O's. Does not appear overly volume overloaded (I am concerned her abdominal pain will not be fully relieved once diuresis is complete).Howver, her BNP has trended up from 1500 to 1900 despite diuresis (AKI can contribute to this). Will recheck CXR.  - continue Coreg 12.5mg  BID. ARB currently held with AKI. Dig level up to 1.6 (has been discontinued).Started on Imdur 15mg  daily and Hydralazine 25mg  Q8H. BP improved. Will titrate Imdur to 30mg  daily.  2. Chronic Atrial Fibrillation -was taking both Lopressor and Coreg PTA instead of Coreg alone. Pauses up to 2.1 seconds noted on telemetry, likely secondary to taking multiple AV nodal blocking agents. No  recurrent pauses noted. Continue Coreg for rate-control. - This patients CHA2DS2-VASc Score and unadjusted Ischemic Stroke Rate (% per year) is equal to 9.7 % stroke rate/year from a score of 6(CHF, HTN, DM, Female, Age (2)). On Xarelto PTA, held due to patient reporting dark tarry stools. Occult blood negative and Hgb stable. Xarelto resumed at 15mg  daily (adjusted dose for renal dysfunction).  3. Elevated Troponin - Cyclic troponin values have been 0.12, 0.17, 0.15, and 0.14. Flat trend, likely secondary to CHF exacerbation and AKI.  - need to obtain recent records from [facility].  4. AKI - creatinine 1.73 (was 0.99 two months ago).   5. Abdominal Pain - Abdominal US with no acute or focal lesion to explain the patient's abdominal pain but anincidental finding of a3.3 cm hyperechoic lesion at the inferior aspect of the right lobe of the liver was noted, likely representinga hemangioma. F/U CT or MRI recommended. - per admitting team  6. Hypokalemia - resolved, K+ 3.9 this  AM  Signed, Erma Heritage , PA-C 11:40 AM 09/20/2015 Pager: [phone]    Patient seen and examined. Agree with assessment and plan. Good diuresis; I/0 -3295; Cr 1.73 today. EF 30 - 35%on echo.  BNP 1971.  Now on nitrates/hydralazine. Will increase imdur to 30 mg today, and plan to titrate hydralazine to 37.5 mg q 8 hrs tomorrow.  LFT's normal.  Troponin elevation c/w demand ischemia. AF rate controlled in the 70's. Continue current dose of coreg.   Troy Sine, MD, [facility] 09/20/2015 12:39 PM

## 2015-09-20 NOTE — Progress Notes (Signed)
[facility] Progress Note  Patient: Lisa [name] [id number]   PCP: Colonel Bald, MD DOB: 01-30-1936   DOA: 09/17/2015   DOS: 09/20/2015   Date of Service: the patient was seen and examined on 09/20/2015  Subjective: Mentions that the abdominal pain is getting better. Onset about fluid in her lung. Denies any shortness of breath. No dizziness or lightheadedness. Nutrition: Tolerating oral diet  Brief hospital course: Lisa [name] is an 80 y.o. female with a PMH of hypertension, stage III CKD (baseline creatinine around 1 post (, chronic combined CHF (EF 40-45 percent by echo in 2016), bicuspid aortic valve, atrial fibrillation on anticoagulation, OSA managed with CPAP, and diabetes who was admitted 09/17/15 for evaluation of abdominal and chest pain associated with lightheadedness, orthopnea, and lower extremity edema. In the ED, she was found to have a troponin of 0.09. CT of her abdomen negative for acute abdominal pathology but did show bilateral pleural effusions suggestive of CHF as well as a small pericardial effusion. BNP was 2,503. Currently further plan is to continue titrating cardiac medication as well as diuresis.  Assessment and Plan: 1. Acute on chronic combined systolic and diastolic heart failure ([facility]) Patient takes 40 mg of oral Lasix 3 times a day per medication report. On admission, she was placed on 40 mg of IV Lasix twice a day. No change in weight or significant diuresis noted, although the patient does say she feels a bit better.  Currently on Lasix to 60 mg IV twice a day. Troponin elevation likely secondary to demand ischemia. A 2-D echo was repeated which showed progression of aortic stenosis. Appreciate cardiac consultation  Active Problems:   Essential hypertension Continue Coreg. Diovan on hold secondary to acute kidney injury.    Acute kidney injury in the setting of CKD (chronic kidney disease) stage 3, GFR 15-29 ml/min (HCC) Baseline  creatinine around 1. Current creatinine elevated over usual baseline values, suspect from decreased cardiac output due to heart failure exacerbation. Monitor closely with diuresis.    OSA on CPAP Continue CPAP daily at bedtime.    Persistent atrial fibrillation (HCC) /pauses This patients CHA2DS2-VASc Score and unadjusted Ischemic Stroke Rate (% per year) is equal to 9.7 % stroke rate/year from a score of 6(CHF, HTN, DM, Female, Age (2)Heart rate controlled on Coreg and digoxin. On Xarelto chronically, but this was held on admission due to the concerns for dark stools. Fecal occult blood testing is negative, so Xarelto resumed, renally adjusted    Uncontrolled type 2 diabetes mellitus with circulatory disorder, with long-term current use of insulin (HCC) Currently being managed with Lantus and sliding scale insulin.    Liver lesion Follow-up recommended. Once patient more medically stable, could proceed with inpatient CT or MRI or schedule as an outpatient non-urgently.    Leukocytosis No evidence of infection. Monitor  Pain management: When necessary Tylenol Activity: Consulted physical therapy Bowel regimen: last BM 09/20/2015 Diet: Cardiac diet DVT Prophylaxis: subcutaneous Heparin  Advance goals of care discussion: Full code  Family Communication: NO family was present at bedside, at the time of interview.  Disposition:  Discharge to home. Expected discharge date: 09/22/2015, pending improvement in CHF  Consultants: Cardiology Procedures: None  Antibiotics: Anti-infectives    None        Intake/Output Summary (Last 24 hours) at 09/20/15 1936 Last data filed at 09/20/15 1907  Gross per 24 hour  Intake              480 ml  Output             1750 ml  Net            -1270 ml   Filed Weights   09/18/15 0443 09/19/15 0700 09/20/15 0440  Weight: 76.8 kg (169 lb 5 oz) 75.5 kg (166 lb 7.2 oz) 73.1 kg (161 lb 3.2 oz)    Objective: Physical Exam: Vitals:    09/19/15 1800 09/19/15 2004 09/20/15 0440 09/20/15 1444  BP: (!) 106/58 122/82 137/72 117/62  Pulse: 60 81 69 60  Resp:  20 20 18   Temp:  98.4 F (36.9 C) 98.2 F (36.8 C) 98.2 F (36.8 C)  TempSrc:  Oral Oral Oral  SpO2:  98% 95% 95%  Weight:   73.1 kg (161 lb 3.2 oz)   Height:        General: Alert, Awake and Oriented to Time, Place and Person. Appear in moderate distress Eyes: PERRL, Conjunctiva normal ENT: Oral Mucosa clear moist. Neck: mild JVD, no Abnormal Mass Or lumps Cardiovascular: S1 and S2 Present, aortic systolic Murmur, Respiratory: Bilateral Air entry equal and Decreased, basal Crackles, no wheezes Abdomen: Bowel Sound [resent, Soft and no tenderness Skin: no redness, no Rash  Extremities: bilateral Pedal edema, no calf tenderness Neurologic: Grossly no focal neuro deficit. Bilaterally Equal motor strength  Data Reviewed: CBC:  Recent Labs Lab 09/17/15 1307 09/19/15 0547 09/20/15 0550  WBC 15.6* 12.2* 12.5*  HGB 11.4* 11.0* 11.2*  HCT 35.1* 33.9* 34.2*  MCV 82.8 81.7 80.3  PLT 279 315 Q000111Q   Basic Metabolic Panel:  Recent Labs Lab 09/17/15 1307 09/18/15 0601 09/19/15 0547 09/20/15 0550  NA 139 139 141 140  K 4.4 3.5 3.4* 3.9  CL 105 101 103 101  CO2 26 27 28 28   GLUCOSE 71 117* 105* 214*  BUN 48* 48* 53* 51*  CREATININE 1.76* 1.71* 1.76* 1.73*  CALCIUM 8.8* 8.6* 8.7* 9.1  MG  --   --   --  1.8    Liver Function Tests:  Recent Labs Lab 09/17/15 1307  AST 28  ALT 23  ALKPHOS 85  BILITOT 1.0  PROT 7.1  ALBUMIN 3.6    Recent Labs Lab 09/17/15 1307  LIPASE 18   No results for input(s): AMMONIA in the last 168 hours. Coagulation Profile:  Recent Labs Lab 09/20/15 0550  INR 1.13   Cardiac Enzymes:  Recent Labs Lab 09/17/15 1307 09/17/15 1937 09/18/15 0000 09/18/15 0601 09/18/15 1133  TROPONINI 0.09* 0.12* 0.17* 0.15* 0.14*   BNP (last 3 results) No results for input(s): PROBNP in the last 8760  hours.  CBG:  Recent Labs Lab 09/19/15 1641 09/19/15 2138 09/20/15 0817 09/20/15 1208 09/20/15 1642  GLUCAP 286* 246* 272* 237* 230*    Studies: Dg Chest Port 1 View  Result Date: 09/20/2015 CLINICAL DATA:  CHF exacerbation. EXAM: PORTABLE CHEST 1 VIEW COMPARISON:  09/17/2015. FINDINGS: Stable enlarged cardiac silhouette. Decreased prominence of the pulmonary vasculature. Increased prominence of the interstitial markings. Bilateral pleural effusions and bibasilar airspace opacity. Thoracic spine degenerative changes. Diffuse osteopenia. Aortic calcifications. IMPRESSION: 1. Progressive changes of congestive heart failure with the exception of less pulmonary vascular congestion. 2. Bibasilar atelectasis. 3. Aortic atherosclerosis. Electronically Signed   By: Claudie Revering M.D.   On: 09/20/2015 13:39     Scheduled Meds: . carvedilol  12.5 mg Oral BID WC  . furosemide  60 mg Intravenous BID  . guaiFENesin  600 mg Oral BID  . hydrALAZINE  25 mg Oral Q8H  . insulin aspart  0-20 Units Subcutaneous TID WC  . insulin aspart  0-5 Units Subcutaneous QHS  . insulin aspart  4 Units Subcutaneous TID WC  . insulin glargine  30 Units Subcutaneous QHS  . [START ON 09/21/2015] isosorbide mononitrate  30 mg Oral Daily  . rivaroxaban  15 mg Oral Q lunch  . rosuvastatin  10 mg Oral Daily  . sodium chloride flush  3 mL Intravenous Q12H   Continuous Infusions:  PRN Meds: sodium chloride, acetaminophen, ondansetron (ZOFRAN) IV, sodium chloride flush, zolpidem  Time spent: 30 minutes  Author: Berle Mull, MD [facility] Pager: [phone] 09/20/2015 7:36 PM  If 7PM-7AM, please contact night-coverage at [email or web address], password [facility]

## 2015-09-20 NOTE — Progress Notes (Signed)
RT attempted to place Pt on CPAP and she pushes it away. Pt wishes not to utilize tonight.  RT to monitor and assess as needed.

## 2015-09-21 LAB — BASIC METABOLIC PANEL
Anion gap: 9 (ref 5–15)
BUN: 47 mg/dL — AB (ref 6–20)
CALCIUM: 9 mg/dL (ref 8.9–10.3)
CO2: 28 mmol/L (ref 22–32)
CREATININE: 1.73 mg/dL — AB (ref 0.44–1.00)
Chloride: 103 mmol/L (ref 101–111)
GFR calc non Af Amer: 27 mL/min — ABNORMAL LOW (ref >60)
GFR, EST AFRICAN AMERICAN: 32 mL/min — AB (ref >60)
GLUCOSE: 190 mg/dL — AB (ref 65–99)
Potassium: 4.4 mmol/L (ref 3.5–5.1)
Sodium: 140 mmol/L (ref 135–145)

## 2015-09-21 LAB — TROPONIN I: Troponin I: 0.1 ng/mL (ref <0.03)

## 2015-09-21 LAB — GLUCOSE, CAPILLARY
GLUCOSE-CAPILLARY: 274 mg/dL — AB (ref 65–99)
GLUCOSE-CAPILLARY: 332 mg/dL — AB (ref 65–99)
GLUCOSE-CAPILLARY: 186 mg/dL — AB (ref 65–99)
Glucose-Capillary: 277 mg/dL — ABNORMAL HIGH (ref 65–99)

## 2015-09-21 MED ORDER — HYDRALAZINE HCL 25 MG PO TABS
37.5000 mg | ORAL_TABLET | Freq: Three times a day (TID) | ORAL | Status: DC
  Administered 2015-09-21 – 2015-09-25 (×10): 37.5 mg via ORAL
  Filled 2015-09-21 (×12): qty 2

## 2015-09-21 MED ORDER — SENNOSIDES-DOCUSATE SODIUM 8.6-50 MG PO TABS
1.0000 | ORAL_TABLET | Freq: Two times a day (BID) | ORAL | Status: DC
  Administered 2015-09-21 – 2015-09-25 (×9): 1 via ORAL
  Filled 2015-09-21 (×9): qty 1

## 2015-09-21 MED ORDER — POLYETHYLENE GLYCOL 3350 17 G PO PACK
17.0000 g | PACK | Freq: Every day | ORAL | Status: DC
  Administered 2015-09-21 – 2015-09-25 (×5): 17 g via ORAL
  Filled 2015-09-21 (×5): qty 1

## 2015-09-21 MED ORDER — INSULIN ASPART 100 UNIT/ML ~~LOC~~ SOLN
8.0000 [IU] | Freq: Three times a day (TID) | SUBCUTANEOUS | Status: DC
  Administered 2015-09-21 – 2015-09-25 (×13): 8 [IU] via SUBCUTANEOUS

## 2015-09-21 NOTE — Progress Notes (Addendum)
Inpatient Diabetes Program Recommendations  AACE/ADA: New Consensus Statement on Inpatient Glycemic Control (2015)  Target Ranges:  Prepandial:   less than 140 mg/dL      Peak postprandial:   less than 180 mg/dL (1-2 hours)      Critically ill patients:  140 - 180 mg/dL   Results for Lisa [name], Lisa [name] (MRN [id number]) as of 09/21/2015 14:14  Ref. Range 09/21/2015 07:42 09/21/2015 12:33  Glucose-Capillary Latest Ref Range: 65 - 99 mg/dL 277 (H) 274 (H)    Home DM Meds: (taken from Chart Review of Dr. Arman Filter notes from [facility] Endocrinology appt 09/14/15) - Metformin 500 mg 2x a day                     - Lantus 30 unitsat bedtime                     - Novolog 10-15 min before each meal - 3x a day         - 10 units before a smaller meal         - 15 units before a regular meal       If sugars 80-100 before a meal, take only 8 units       If sugars 60-80 before a meal, take only 6 units       If sugars <60 before a meal, do not take any insulin with that meal  Current Insulin Orders: Lantus 30 units QHS Novolog Resistant Correction Scale/ SSI (0-20 units) TID AC + HS Novolog 8 units tidwc       MD- Please consider the following in-hospital insulin adjustments:  1. Increase Lantus to 35 units QHS  2. Increase Novolog Meal Coverage to: Novolog 10 units tid with meals    --Will follow patient during hospitalization--  Wyn Quaker RN, MSN, CDE Diabetes Coordinator Inpatient Glycemic Control Team Team Pager: [phone] (8a-5p)

## 2015-09-21 NOTE — Progress Notes (Signed)
SATURATION QUALIFICATIONS: (This note is used to comply with regulatory documentation for home oxygen)  Patient Saturations on Room Air at Rest = 97 %  Patient Saturations on Room Air while Ambulating = 94%  Patient Saturations on n/a Liters of oxygen while Ambulating = n/a%  Please briefly explain why patient needs home oxygen: oxygen is not needed. Pt ambulated 90 ft with use of walker, tolerated well. SOB denied

## 2015-09-21 NOTE — Evaluation (Signed)
Physical Therapy Evaluation Patient Details Name: Lisa [name] MRN: [id number] DOB: 10-16-1935 Today's Date: 09/21/2015   History of Present Illness  80 y.o. female w/ PMH of chronic combined systolic and diastolic CHF (EF [id number] by echo in 08/2014), persistent atrial fibrillation (on Xarelto), mild AS, OSA (on CPAP), CKD, and Type 2 DM who presented to [facility] ED on 09/17/2015 for abdominal pain and nausea.   Clinical Impression  Pt is independent with bed mobility, transfers, and walking with a RW. She denied chest pain with ambulation, HR 80's while walking, no dyspnea. From PT standpoint she is safe to DC home. She stated she'd like to walk in halls several times/day, RN notified. PT signing off  2* pt is independent with RW with mobility.     Follow Up Recommendations No PT follow up    Equipment Recommendations  None recommended by PT    Recommendations for Other Services       Precautions / Restrictions Precautions Precautions: Fall Precaution Comments: pt stated she did fall once Restrictions Weight Bearing Restrictions: No      Mobility  Bed Mobility Overal bed mobility: Independent                Transfers Overall transfer level: Independent                  Ambulation/Gait Ambulation/Gait assistance: Modified independent (Device/Increase time) Ambulation Distance (Feet): 200 Feet Assistive device: Rolling walker (2 wheeled) Gait Pattern/deviations: WFL(Within Functional Limits)   Gait velocity interpretation: at or above normal speed for age/gender General Gait Details: HR 80s, no CP, no LOB  Stairs            Wheelchair Mobility    Modified Rankin (Stroke Patients Only)       Balance Overall balance assessment: Modified Independent                                           Pertinent Vitals/Pain Pain Assessment: No/denies pain    Home Living Family/patient expects to be discharged to:: Private  residence Living Arrangements: Spouse/significant other Available Help at Discharge: Family;Available 24 hours/day         Home Layout: Two level;Bed/bath upstairs Home Equipment: Walker - 2 wheels      Prior Function Level of Independence: Independent with assistive device(s)         Comments: pt stated she bathes/dresses independently     Hand Dominance        Extremity/Trunk Assessment   Upper Extremity Assessment: Overall WFL for tasks assessed           Lower Extremity Assessment: Overall WFL for tasks assessed      Cervical / Trunk Assessment: Normal  Communication   Communication: Prefers language other than English;HOH ([place], used interpreter, pt not always able to understand interpreter due to [facility])  Cognition Arousal/Alertness: Awake/alert Behavior During Therapy: WFL for tasks assessed/performed Overall Cognitive Status: Within Functional Limits for tasks assessed                      General Comments      Exercises        Assessment/Plan    PT Assessment Patent does not need any further PT services  PT Diagnosis     PT Problem List    PT Treatment Interventions     PT Goals (  Current goals can be found in the Care Plan section) Acute Rehab PT Goals Patient Stated Goal: to walk more PT Goal Formulation: All assessment and education complete, DC therapy    Frequency     Barriers to discharge        Co-evaluation               End of Session Equipment Utilized During Treatment: Gait belt Activity Tolerance: Patient tolerated treatment well Patient left: in bed;with call bell/phone within reach;with bed alarm set Nurse Communication: Mobility status         Time: [id number] PT Time Calculation (min) (ACUTE ONLY): 28 min   Charges:   PT Evaluation $PT Eval Low Complexity: 1 Procedure PT Treatments $Gait Training: 8-22 mins   PT G Codes:        Philomena Doheny 09/21/2015, 1:17  PM [phone]

## 2015-09-21 NOTE — Progress Notes (Signed)
Hospital Problem List     Principal Problem:   Acute on chronic combined systolic and diastolic heart failure (HCC) Active Problems:   Essential hypertension   OSA on CPAP   Persistent atrial fibrillation ([place])   Uncontrolled type 2 diabetes mellitus with circulatory disorder, with long-term current use of insulin (HCC)   Leukocytosis   Elevated troponin   Chest pain   Acute kidney injury ([place])   Sinus pause   Liver lesion    Patient Profile:   Primary Cardiologist: Dr. Oval Linsey  80 y.o.female w/ PMH of chronic combined systolic and diastolic CHF (EF A999333 by echo in 08/2014), persistent atrial fibrillation (on Xarelto), mild AS, OSA (on CPAP), CKD, and Type 2 DM who presented to [facility] ED on 09/17/2015 for abdominal pain and nausea. Cards consulted for CHF exacerbation.  Subjective   [place] Interpreter Used: Appreciated Nick from [employer] assistance with rounds.  Reported still having abdominal pain. Notes one episode of chest discomfort overnight, felt like she was congested. Breathing has improved.  Inpatient Medications    . carvedilol  12.5 mg Oral BID WC  . furosemide  60 mg Intravenous BID  . guaiFENesin  600 mg Oral BID  . hydrALAZINE  25 mg Oral Q8H  . insulin aspart  0-20 Units Subcutaneous TID WC  . insulin aspart  0-5 Units Subcutaneous QHS  . insulin aspart  8 Units Subcutaneous TID WC  . insulin glargine  30 Units Subcutaneous QHS  . isosorbide mononitrate  30 mg Oral Daily  . polyethylene glycol  17 g Oral Daily  . rivaroxaban  15 mg Oral Q lunch  . rosuvastatin  10 mg Oral Daily  . senna-docusate  1 tablet Oral BID  . sodium chloride flush  3 mL Intravenous Q12H    Vital Signs    Vitals:   09/20/15 0440 09/20/15 1444 09/20/15 2154 09/21/15 0637  BP: 137/72 117/62 (!) 144/81 137/60  Pulse: 69 60 66 76  Resp: 20 18 18 18   Temp: 98.2 F (36.8 C) 98.2 F (36.8 C) 98.1 F (36.7 C) 98.2 F (36.8 C)  TempSrc: Oral Oral Oral Oral    SpO2: 95% 95% 98% 96%  Weight: 161 lb 3.2 oz (73.1 kg)   162 lb 11.2 oz (73.8 kg)  Height:        Intake/Output Summary (Last 24 hours) at 09/21/15 1059 Last data filed at 09/21/15 1000  Gross per 24 hour  Intake              120 ml  Output             1650 ml  Net            -1530 ml   Filed Weights   09/19/15 0700 09/20/15 0440 09/21/15 0637  Weight: 166 lb 7.2 oz (75.5 kg) 161 lb 3.2 oz (73.1 kg) 162 lb 11.2 oz (73.8 kg)    Physical Exam    General: Well developed, well nourished, female appearing in no acute distress. Head: Normocephalic, atraumatic.  Neck: Supple without bruits, JVD not elevated. Lungs:  Resp regular and unlabored, mild rales at bases, no wheezing. Heart: Irregularly irregular, S1, S2, no S3, S4, 2/6 SEM at RUSB; no rub. Abdomen: Soft, non-tender, non-distended with normoactive bowel sounds. No hepatomegaly. No rebound/guarding. No obvious abdominal masses. Extremities: No clubbing, cyanosis, or edema. Distal pedal pulses are 2+ bilaterally. Neuro: Alert and oriented X 3. Moves all extremities spontaneously. Psych: Normal affect.  Labs    CBC  Recent Labs  09/19/15 0547 09/20/15 0550  WBC 12.2* 12.5*  HGB 11.0* 11.2*  HCT 33.9* 34.2*  MCV 81.7 80.3  PLT 315 Q000111Q   Basic Metabolic Panel  Recent Labs  09/20/15 0550 09/21/15 0554  NA 140 140  K 3.9 4.4  CL 101 103  CO2 28 28  GLUCOSE 214* 190*  BUN 51* 47*  CREATININE 1.73* 1.73*  CALCIUM 9.1 9.0  MG 1.8  --    Liver Function Tests No results for input(s): AST, ALT, ALKPHOS, BILITOT, PROT, ALBUMIN in the last 72 hours. No results for input(s): LIPASE, AMYLASE in the last 72 hours. Cardiac Enzymes  Recent Labs  09/18/15 1133 09/21/15 0918  TROPONINI 0.14* 0.10*   BNP Invalid input(s): POCBNP   BNP (last 3 results)  Recent Labs  09/18/15 0601 09/19/15 0547 09/20/15 0550  BNP 2,503.1* 1,562.3* 1,971.9*    ProBNP (last 3 results) No results for input(s): PROBNP in the  last 8760 hours.  D-Dimer No results for input(s): DDIMER in the last 72 hours. Hemoglobin A1C No results for input(s): HGBA1C in the last 72 hours. Fasting Lipid Panel No results for input(s): CHOL, HDL, LDLCALC, TRIG, CHOLHDL, LDLDIRECT in the last 72 hours. Thyroid Function Tests No results for input(s): TSH, T4TOTAL, T3FREE, THYROIDAB in the last 72 hours.  Invalid input(s): FREET3  Telemetry    Atrial fibrillation, HR in high 50's - 70's. 1.6 second pause noted. 19 beats NSVT this AM.   ECG    Will obtain repeat EKG.   Cardiac Studies and Radiology    Dg Chest Port 1 View  Result Date: 09/20/2015 CLINICAL DATA:  CHF exacerbation. EXAM: PORTABLE CHEST 1 VIEW COMPARISON:  09/17/2015. FINDINGS: Stable enlarged cardiac silhouette. Decreased prominence of the pulmonary vasculature. Increased prominence of the interstitial markings. Bilateral pleural effusions and bibasilar airspace opacity. Thoracic spine degenerative changes. Diffuse osteopenia. Aortic calcifications. IMPRESSION: 1. Progressive changes of congestive heart failure with the exception of less pulmonary vascular congestion. 2. Bibasilar atelectasis. 3. Aortic atherosclerosis. Electronically Signed   By: Claudie Revering M.D.   On: 09/20/2015 13:39   Dg Abdomen Acute W/chest  Result Date: 09/17/2015 CLINICAL DATA:  Abdominal pain, chest pain EXAM: DG ABDOMEN ACUTE W/ 1V CHEST COMPARISON:  None. FINDINGS: Central vascular congestion and mild perihilar interstitial prominence suspicious for mild interstitial edema. Small right pleural effusion with right basilar atelectasis or infiltrate. There is normal small bowel gas pattern. No evidence of free abdominal air. IMPRESSION: Central mild vascular congestion and mild perihilar interstitial prominence suspicious for mild interstitial edema. Small right pleural effusion with right basilar atelectasis or infiltrate. Normal small bowel gas pattern. Electronically Signed   By: Lahoma Crocker M.D.   On: 09/17/2015 14:54   Ct Renal Stone Study  Result Date: 09/17/2015 CLINICAL DATA:  Left lower abdominal pain and nausea. Congestive heart failure and chronic kidney disease stage 3. EXAM: CT ABDOMEN AND PELVIS WITHOUT CONTRAST TECHNIQUE: Multidetector CT imaging of the abdomen and pelvis was performed following the standard protocol without IV contrast. COMPARISON:  None. FINDINGS: Lower chest: Small bilateral pleural effusions and bibasilar atelectasis. Bibasilar interstitial prominence suspicious for pulmonary edema. Moderate cardiomegaly and small pericardial effusion. Aortic atherosclerosis and coronary artery calcification. Hepatobiliary: No mass visualized on this un-enhanced exam. Pancreas: No mass or inflammatory process identified on this un-enhanced exam. Spleen: Within normal limits in size. Adrenals/Urinary Tract: No evidence of urolithiasis or hydronephrosis. Unopacified urinary bladder is unremarkable in  appearance. Stomach/Bowel: No evidence of obstruction, inflammatory process, or abnormal fluid collections. Normal appendix visualized. Vascular/Lymphatic: No pathologically enlarged lymph nodes. No evidence of abdominal aortic aneurysm. Aortic atherosclerosis noted. Reproductive: No mass or other significant abnormality. Other: None. Musculoskeletal:  No suspicious bone lesions identified. IMPRESSION: No acute findings within the abdomen or pelvis. Cardiomegaly, bibasilar pulmonary interstitial prominence and small bilateral pleural effusions, suspicious for congestive heart failure. Small pericardial effusion. Aortic atherosclerosis and coronary artery calcification. Electronically Signed   By: Earle Gell M.D.   On: 09/17/2015 15:52   US Abdomen Limited Ruq  Result Date: 09/17/2015 CLINICAL DATA:  Epigastric abdominal pain. EXAM: US ABDOMEN LIMITED - RIGHT UPPER QUADRANT COMPARISON:  CT abdomen and pelvis 09/17/2015. Report of CT abdomen and pelvis with contrast 07/14/2008 and  MRI abdomen 08/06/2007 FINDINGS: Gallbladder: Surgically absence Common bile duct: Diameter: Within normal limits following cholecystectomy and 11 mm. No obstructing mass lesion is present. Liver: A focal hyperechoic lesion is present at the inferior aspect of the right lobe of the liver measuring 3.3 x 1.9 x 2.9 cm. No other focal lesions are present. A right pleural effusion is noted. IMPRESSION: 1. No acute or focal lesion to explain the patient's abdominal pain. 2. **An incidental finding of potential clinical significance has been found. 3.3 cm hyperechoic lesion at the inferior aspect of the right lobe of the liver was not previously reported. This is nonspecific and most likely represents a hemangioma. This lesion was not discretely seen on the noncontrast CT of the abdomen and pelvis. It was not described on previous CT or MRI from 2010 and 2009. Follow-up CT of the abdomen only or MRI without and with contrast is recommended for further evaluation.** Electronically Signed   By: San Morelle M.D.   On: 09/17/2015 18:19    Echocardiogram: 09/18/2015 Study Conclusions  - Left ventricle: The cavity size was normal. There was severe concentric hypertrophy. Systolic function was moderately to severely reduced. The estimated ejection fraction was in the range of 30% to 35%. There is akinesis of the mid-apicalanteroseptal, anterolateral, inferoseptal, and apical myocardium. Doppler parameters are consistent with a reversible restrictive pattern, indicative of decreased left ventricular diastolic compliance and/or increased left atrial pressure (grade 3 diastolic dysfunction). - Aortic valve: Trileaflet; moderately thickened, moderately calcified leaflets. Valve mobility was restricted. There was mild stenosis. Peak velocity (S): 283 cm/s. Mean gradient (S): 14 mm Hg. - Mitral valve: Calcified annulus. Mildly thickened leaflets . - Left atrium: The atrium was  severely dilated. Volume/bsa, ES (1-plane Simpson&'s, A4C): 54.6 ml/m^2. - Right ventricle: The cavity size was mildly dilated. Wall thickness was normal. - Pulmonary arteries: Systolic pressure was moderately increased. PA peak pressure: 52 mm Hg (S).  Impressions:  - Since prior echocardiogram, aortic stenosis has advanced (prior mean gradient 49mmHg).  Assessment & Plan    1. Acute on Chronic Combined Systolic and Diastolic CHF - echo in Q000111Q showed EF of 40-45%. Repeat echo this admission shows EF of 30% to 35%. There is akinesis of the mid-apicalanteroseptal, anterolateral, inferoseptal, and apical myocardium. Reports having an MI at [facility] last month. Need to obtain these records. - BNP 1492 on admission and CXR showing central mild vascular congestion and mild perihilar interstitial prominence suspicious for mild interstitial edema. Small right pleural effusion with right basilar atelectasis or infiltrate.  - started on IV Lasix 60mg  BID with a recorded output of -4.8L thus far. Weight at 162 lbs, down 7 pounds since admission. Continue to  obtain daily weights and strict I&O's. Does not appear overly volume overloaded. Howver, her BNP has trended up from 1500 to 1900 despite diuresis (AKI can contribute to this). CXR still with bilateral pleural effusions. - continue Coreg 12.5mg  BID. ARB currently held with AKI. Dig level up to 1.6 (has been discontinued).Continue Imdur 30mg  daily. Will titrate Hydralazine from 25mg  Q8H to 37.5mg  Q8H.   2. Chronic Atrial Fibrillation -was taking both Lopressor and Coreg PTA instead of Coreg alone. Pauses up to 2.1 seconds noted on telemetry, likely secondary to taking multiple AV nodal blocking agents. No recurrent pauses > 2.0s noted. Continue Coreg for rate-control. - This patients CHA2DS2-VASc Score and unadjusted Ischemic Stroke Rate (% per year) is equal to 9.7 % stroke rate/year from a score of 6(CHF, HTN, DM,  Female, Age (2)). On Xarelto PTA, held due to patient reporting dark tarry stools. Occult blood negative and Hgb stable. Xarelto resumed at 15mg  daily (adjusted dose for renal dysfunction).  3. Elevated Troponin - Cyclic troponin values have been 0.12, 0.17, 0.15, and 0.14. Flat trend, likely secondary to CHF exacerbation and AKI.  - need to obtain recent records from [facility].  4. AKI - creatinine 1.73 (was 0.99 two months ago).   5. Abdominal Pain - Abdominal US with no acute or focal lesion to explain the patient's abdominal pain but anincidental finding of a3.3 cm hyperechoic lesion at the inferior aspect of the right lobe of the liver was noted, likely representinga hemangioma. F/U CT or MRI recommended. - per admitting team  6. Hypokalemia - resolved, K+ 4.4 this AM  7. NSVT - appears she had 19 beats NSVT this AM.  - K+ 4.4. Mg 1.8 yesterday. Will recheck. - continue BB.  8. Chest Pain - reported having chest discomfort overnight, only lasting a few minutes then resolving spontaneously. Says she felt congested.  - will recheck EKG and troponin.   Arna Medici , PA-C 10:59 AM 09/21/2015 Pager: [phone]  Patient seen and examined. Agree with assessment and plan. Breathing better. Walking in the hall with walker without chest pain. I/O - 4825 since admission. F/U BNP tomorrow. Titrate hydralazine to 37.g mg q 8 hrs.  F/U ECG in am.   Troy Sine, MD, [facility] 09/21/2015 1:13 PM

## 2015-09-21 NOTE — Progress Notes (Signed)
[facility] Progress Note  Patient: Lisa [name] [id number]   PCP: Colonel Bald, MD DOB: 1935/03/20   DOA: 09/17/2015   DOS: 09/21/2015   Date of Service: the patient was seen and examined on 09/21/2015  Subjective: Denies any acute complaint mentions her breathing as well as abdominal pain is getting better. No fever no chills. Complained of constipation. Brief hospital course: Lisa [name] is an 80 y.o. female with a PMH of hypertension, stage III CKD (baseline creatinine around 1 post (, chronic combined CHF (EF 40-45 percent by echo in 2016), bicuspid aortic valve, atrial fibrillation on anticoagulation, OSA managed with CPAP, and diabetes who was admitted 09/17/15 for evaluation of abdominal and chest pain associated with lightheadedness, orthopnea, and lower extremity edema. In the ED, she was found to have a troponin of 0.09. CT of her abdomen negative for acute abdominal pathology but did show bilateral pleural effusions suggestive of CHF as well as a small pericardial effusion. BNP was 2,503. Currently further plan is to continue titrating cardiac medication as well as diuresis.  Assessment and Plan: 1. Acute on chronic combined systolic and diastolic heart failure ([place]) Patient takes 40 mg of oral Lasix 3 times a day per medication report. On admission, she was placed on 40 mg of IV Lasix twice a day. No change in weight or significant diuresis noted, although the patient does say she feels a bit better.  Currently on Lasix to 60 mg IV twice a day. Troponin elevation likely secondary to demand ischemia. A 2-D echo was repeated which showed progression of aortic stenosis. Currently the patient is to adjust cardiac medication. Continue Lasix.  Active Problems:   Essential hypertension Continue Coreg. Diovan on hold secondary to acute kidney injury.    Acute kidney injury in the setting of CKD (chronic kidney disease) stage 3, GFR 15-29 ml/min (HCC) Baseline  creatinine around 1. Current creatinine elevated over usual baseline values, suspect from decreased cardiac output due to heart failure exacerbation. Monitor closely with diuresis.    OSA on CPAP Continue CPAP daily at bedtime.    Persistent atrial fibrillation (HCC) /pauses This patients CHA2DS2-VASc Score and unadjusted Ischemic Stroke Rate (% per year) is equal to 9.7 % stroke rate/year from a score of 6(CHF, HTN, DM, Female, Age (2)Heart rate controlled on Coreg and digoxin. On Xarelto chronically, but this was held on admission due to the concerns for dark stools. Fecal occult blood testing is negative, so Xarelto resumed, renally adjusted    Uncontrolled type 2 diabetes mellitus with circulatory disorder, with long-term current use of insulin (HCC) Currently being managed with Lantus and sliding scale insulin.    Liver lesion Follow-up recommended. Once patient more medically stable, could proceed with inpatient CT or MRI or schedule as an outpatient non-urgently.    Leukocytosis No evidence of infection. Monitor  Chest pain. Probably noncardiac or associated with CHF. Troponins are unremarkable, EKG also unremarkable.  Pain management: When necessary Tylenol Activity: Consulted physical therapy Bowel regimen: last BM 09/20/2015 Diet: Cardiac diet DVT Prophylaxis: subcutaneous Heparin  Advance goals of care discussion: Full code  Family Communication: NO family was present at bedside, at the time of interview.  Disposition:  Discharge to home. Expected discharge date: 09/22/2015, pending improvement in CHF  Consultants: Cardiology Procedures: None  Antibiotics: Anti-infectives    None        Intake/Output Summary (Last 24 hours) at 09/21/15 1616 Last data filed at 09/21/15 1000  Gross per 24 hour  Intake  0 ml  Output             1350 ml  Net            -1350 ml   Filed Weights   09/19/15 0700 09/20/15 0440 09/21/15 0637  Weight: 75.5 kg  (166 lb 7.2 oz) 73.1 kg (161 lb 3.2 oz) 73.8 kg (162 lb 11.2 oz)    Objective: Physical Exam: Vitals:   09/20/15 1444 09/20/15 2154 09/21/15 0637 09/21/15 1425  BP: 117/62 (!) 144/81 137/60 122/65  Pulse: 60 66 76 68  Resp: 18 18 18 20   Temp: 98.2 F (36.8 C) 98.1 F (36.7 C) 98.2 F (36.8 C) 98 F (36.7 C)  TempSrc: Oral Oral Oral Oral  SpO2: 95% 98% 96% 97%  Weight:   73.8 kg (162 lb 11.2 oz)   Height:        General: Alert, Awake and Oriented to Time, Place and Person. Appear in moderate distress Eyes: PERRL, Conjunctiva normal ENT: Oral Mucosa clear moist. Neck: mild JVD, no Abnormal Mass Or lumps Cardiovascular: S1 and S2 Present, aortic systolic Murmur, Respiratory: Bilateral Air entry equal and Decreased, basal Crackles, no wheezes Abdomen: Bowel Sound [resent, Soft and no tenderness Skin: no redness, no Rash  Extremities: bilateral Pedal edema, no calf tenderness Neurologic: Grossly no focal neuro deficit. Bilaterally Equal motor strength  Data Reviewed: CBC:  Recent Labs Lab 09/17/15 1307 09/19/15 0547 09/20/15 0550  WBC 15.6* 12.2* 12.5*  HGB 11.4* 11.0* 11.2*  HCT 35.1* 33.9* 34.2*  MCV 82.8 81.7 80.3  PLT 279 315 Q000111Q   Basic Metabolic Panel:  Recent Labs Lab 09/17/15 1307 09/18/15 0601 09/19/15 0547 09/20/15 0550 09/21/15 0554  NA 139 139 141 140 140  K 4.4 3.5 3.4* 3.9 4.4  CL 105 101 103 101 103  CO2 26 27 28 28 28   GLUCOSE 71 117* 105* 214* 190*  BUN 48* 48* 53* 51* 47*  CREATININE 1.76* 1.71* 1.76* 1.73* 1.73*  CALCIUM 8.8* 8.6* 8.7* 9.1 9.0  MG  --   --   --  1.8  --     Liver Function Tests:  Recent Labs Lab 09/17/15 1307  AST 28  ALT 23  ALKPHOS 85  BILITOT 1.0  PROT 7.1  ALBUMIN 3.6    Recent Labs Lab 09/17/15 1307  LIPASE 18   No results for input(s): AMMONIA in the last 168 hours. Coagulation Profile:  Recent Labs Lab 09/20/15 0550  INR 1.13   Cardiac Enzymes:  Recent Labs Lab 09/17/15 1937  09/18/15 0000 09/18/15 0601 09/18/15 1133 09/21/15 0918  TROPONINI 0.12* 0.17* 0.15* 0.14* 0.10*   BNP (last 3 results) No results for input(s): PROBNP in the last 8760 hours.  CBG:  Recent Labs Lab 09/20/15 1208 09/20/15 1642 09/20/15 2158 09/21/15 0742 09/21/15 1233  GLUCAP 237* 230* 177* 277* 274*    Studies: No results found.   Scheduled Meds: . carvedilol  12.5 mg Oral BID WC  . furosemide  60 mg Intravenous BID  . guaiFENesin  600 mg Oral BID  . hydrALAZINE  37.5 mg Oral Q8H  . insulin aspart  0-20 Units Subcutaneous TID WC  . insulin aspart  0-5 Units Subcutaneous QHS  . insulin aspart  8 Units Subcutaneous TID WC  . insulin glargine  30 Units Subcutaneous QHS  . isosorbide mononitrate  30 mg Oral Daily  . polyethylene glycol  17 g Oral Daily  . rivaroxaban  15 mg Oral Q lunch  .  rosuvastatin  10 mg Oral Daily  . senna-docusate  1 tablet Oral BID  . sodium chloride flush  3 mL Intravenous Q12H   Continuous Infusions:  PRN Meds: sodium chloride, acetaminophen, ondansetron (ZOFRAN) IV, sodium chloride flush, zolpidem  Time spent: 30 minutes  Author: Berle Mull, MD [facility] Pager: [phone] 09/21/2015 4:16 PM  If 7PM-7AM, please contact night-coverage at [email or web address], password [facility]

## 2015-09-21 NOTE — Care Management Note (Signed)
Case Management Note  Patient Details  Name: Delisa Kendzierski MRN: [id number] Date of Birth: 09-Aug-1935  Subjective/Objective: 80 y/o f admitted w/CHF. [facility]-interpreter portable service used, & also onsite pharmacist(greatly appreciated) used to interpret.From home w/spouse. Active w/[facility] HHRN/PT. Would recc to order HHRN/PT.                   Action/Plan:d/c plan home w/HHC.   Expected Discharge Date:   (unknown)               Expected Discharge Plan:  [place]  In-House Referral:  Interpreting Services ([facility] [place])  Discharge planning Services  CM Consult  Post Acute Care Choice:  Home Health (Active w/[facility] HHRN/PT) Choice offered to:  Patient  DME Arranged:    DME Agency:     HH Arranged:    [place] Agency:     Status of Service:  In process, will continue to follow  If discussed at Long Length of Stay Meetings, dates discussed:    Additional Comments:  Dessa Phi, RN 09/21/2015, 3:46 PM

## 2015-09-21 NOTE — Progress Notes (Signed)
Interpreter [phone] used to assist during assessment and med administration. Education provided r/t medications

## 2015-09-21 NOTE — Progress Notes (Signed)
Pt refused CPAP qhs.  Education provided but Pt swats away at any mention of wearing CPAP.  RT will continue to monitor as needed.

## 2015-09-21 NOTE — Care Management Important Message (Signed)
Important Message  Patient Details  Name: Davonne Lovel MRN: [id number] Date of Birth: 06/15/1935   Medicare Important Message Given:  Yes    Camillo Flaming 09/21/2015, 9:31 AMImportant Message  Patient Details  Name: Margaretha Canlas MRN: [id number] Date of Birth: 1935/07/22   Medicare Important Message Given:  Yes    Camillo Flaming 09/21/2015, 9:31 AM

## 2015-09-22 DIAGNOSIS — I255 Ischemic cardiomyopathy: Secondary | ICD-10-CM

## 2015-09-22 LAB — GLUCOSE, CAPILLARY
GLUCOSE-CAPILLARY: 146 mg/dL — AB (ref 65–99)
GLUCOSE-CAPILLARY: 241 mg/dL — AB (ref 65–99)
Glucose-Capillary: 228 mg/dL — ABNORMAL HIGH (ref 65–99)
Glucose-Capillary: 269 mg/dL — ABNORMAL HIGH (ref 65–99)

## 2015-09-22 LAB — BASIC METABOLIC PANEL
Anion gap: 10 (ref 5–15)
BUN: 55 mg/dL — AB (ref 6–20)
CALCIUM: 9 mg/dL (ref 8.9–10.3)
CHLORIDE: 101 mmol/L (ref 101–111)
CO2: 28 mmol/L (ref 22–32)
CREATININE: 1.72 mg/dL — AB (ref 0.44–1.00)
GFR, EST AFRICAN AMERICAN: 32 mL/min — AB (ref >60)
GFR, EST NON AFRICAN AMERICAN: 27 mL/min — AB (ref >60)
Glucose, Bld: 154 mg/dL — ABNORMAL HIGH (ref 65–99)
Potassium: 3.4 mmol/L — ABNORMAL LOW (ref 3.5–5.1)
SODIUM: 139 mmol/L (ref 135–145)

## 2015-09-22 LAB — OCCULT BLOOD X 1 CARD TO LAB, STOOL: FECAL OCCULT BLD: NEGATIVE

## 2015-09-22 LAB — MAGNESIUM: MAGNESIUM: 1.8 mg/dL (ref 1.7–2.4)

## 2015-09-22 LAB — BRAIN NATRIURETIC PEPTIDE: B NATRIURETIC PEPTIDE 5: 991.2 pg/mL — AB (ref 0.0–100.0)

## 2015-09-22 MED ORDER — INSULIN GLARGINE 100 UNIT/ML ~~LOC~~ SOLN
45.0000 [IU] | Freq: Every day | SUBCUTANEOUS | Status: DC
  Administered 2015-09-22 – 2015-09-24 (×3): 45 [IU] via SUBCUTANEOUS
  Filled 2015-09-22 (×4): qty 0.45

## 2015-09-22 MED ORDER — GUAIFENESIN 100 MG/5ML PO SOLN
5.0000 mL | ORAL | Status: DC | PRN
  Administered 2015-09-22 – 2015-09-25 (×6): 100 mg via ORAL
  Filled 2015-09-22 (×6): qty 10

## 2015-09-22 NOTE — Progress Notes (Signed)
[facility] Progress Note  Patient: Lisa [name] [id number]   PCP: Colonel Bald, MD DOB: 04/21/1935   DOA: 09/17/2015   DOS: 09/22/2015   Date of Service: the patient was seen and examined on 09/22/2015  Subjective:Constipation currently resolved. Continues to have shortness of breath and feel for orthopnea  Brief hospital course: Lisa [name] is an 80 y.o. female with a PMH of hypertension, stage III CKD (baseline creatinine around 1 post (, chronic combined CHF (EF 40-45 percent by echo in 2016), bicuspid aortic valve, atrial fibrillation on anticoagulation, OSA managed with CPAP, and diabetes who was admitted 09/17/15 for evaluation of abdominal and chest pain associated with lightheadedness, orthopnea, and lower extremity edema. In the ED, she was found to have a troponin of 0.09. CT of her abdomen negative for acute abdominal pathology but did show bilateral pleural effusions suggestive of CHF as well as a small pericardial effusion. BNP was 2,503. Currently further plan is to continue titrating cardiac medication as well as diuresis.  Assessment and Plan: 1. Acute on chronic combined systolic and diastolic heart failure ([place]) Patient takes 40 mg of oral Lasix 3 times a day per medication report. On admission, she was placed on 40 mg of IV Lasix twice a day. No change in weight or significant diuresis noted, although the patient does say she feels a bit better.  Currently on Lasix to 60 mg IV twice a day. Troponin elevation likely secondary to demand ischemia. A 2-D echo was repeated which showed progression of aortic stenosis. Currently the patient is to adjust cardiac medication. Continue IV Lasix.  Active Problems:   Essential hypertension Continue Coreg. Diovan on hold secondary to acute kidney injury.    Acute kidney injury in the setting of CKD (chronic kidney disease) stage 3, GFR 15-29 ml/min (HCC) Baseline creatinine around 1. Current creatinine elevated  over usual baseline values, suspect from decreased cardiac output due to heart failure exacerbation. Monitor closely with diuresis.    OSA on CPAP Continue CPAP daily at bedtime.    Persistent atrial fibrillation (HCC) /pauses This patients CHA2DS2-VASc Score and unadjusted Ischemic Stroke Rate (% per year) is equal to 9.7 % stroke rate/year from a score of 6(CHF, HTN, DM, Female, Age (2)Heart rate controlled on Coreg and digoxin. On Xarelto chronically, but this was held on admission due to the concerns for dark stools. Fecal occult blood testing is negative, so Xarelto resumed, renally adjusted    Uncontrolled type 2 diabetes mellitus with circulatory disorder, with long-term current use of insulin (HCC) Currently being managed with Lantus and sliding scale insulin. Increasing Lantus to 44 units.    Liver lesion Follow-up recommended. Once patient more medically stable, could proceed with inpatient CT or MRI or schedule as an outpatient non-urgently.    Leukocytosis No evidence of infection. Monitor  Chest pain. Probably noncardiac or associated with CHF. Troponins are unremarkable, EKG also unremarkable.  Constipation. Currently resolved.  Pain management: When necessary Tylenol Activity: Consulted physical therapy Bowel regimen: last BM 09/20/2015 Diet: Cardiac diet DVT Prophylaxis: subcutaneous Heparin  Advance goals of care discussion: Full code  Family Communication: NO family was present at bedside, at the time of interview.  Disposition:  Discharge to home. Expected discharge date: 09/23/2015, pending improvement in CHF  Consultants: Cardiology Procedures: None  Antibiotics: Anti-infectives    None        Intake/Output Summary (Last 24 hours) at 09/22/15 1813 Last data filed at 09/22/15 1500  Gross per 24 hour  Intake              476 ml  Output             1250 ml  Net             -774 ml   Filed Weights   09/20/15 0440 09/21/15 0637 09/22/15  0642  Weight: 73.1 kg (161 lb 3.2 oz) 73.8 kg (162 lb 11.2 oz) 74.1 kg (163 lb 5.8 oz)    Objective: Physical Exam: Vitals:   09/21/15 1425 09/21/15 2158 09/22/15 0642 09/22/15 1628  BP: 122/65 (!) 137/96 122/68 (!) 124/56  Pulse: 68 63 82 81  Resp: 20 20 20 20   Temp: 98 F (36.7 C) 97.9 F (36.6 C) 97.6 F (36.4 C) 98.3 F (36.8 C)  TempSrc: Oral Oral Oral Oral  SpO2: 97% 99% 95% 98%  Weight:   74.1 kg (163 lb 5.8 oz)   Height:        General: Alert, Awake and Oriented to Time, Place and Person. Appear in moderate distress Eyes: PERRL, Conjunctiva normal ENT: Oral Mucosa clear moist. Neck: mild JVD, no Abnormal Mass Or lumps Cardiovascular: S1 and S2 Present, aortic systolic Murmur, Respiratory: Bilateral Air entry equal and Decreased, basal Crackles, no wheezes Abdomen: Bowel Sound [resent, Soft and no tenderness Skin: no redness, no Rash  Extremities: bilateral Pedal edema, no calf tenderness Neurologic: Grossly no focal neuro deficit. Bilaterally Equal motor strength  Data Reviewed: CBC:  Recent Labs Lab 09/17/15 1307 09/19/15 0547 09/20/15 0550  WBC 15.6* 12.2* 12.5*  HGB 11.4* 11.0* 11.2*  HCT 35.1* 33.9* 34.2*  MCV 82.8 81.7 80.3  PLT 279 315 [id number]   Basic Metabolic Panel:  Recent Labs Lab 09/18/15 0601 09/19/15 0547 09/20/15 0550 09/21/15 0554 09/22/15 0553  NA 139 141 140 140 139  K 3.5 3.4* 3.9 4.4 3.4*  CL 101 103 101 103 101  CO2 27 28 28 28 28   GLUCOSE 117* 105* 214* 190* 154*  BUN 48* 53* 51* 47* 55*  CREATININE 1.71* 1.76* 1.73* 1.73* 1.72*  CALCIUM 8.6* 8.7* 9.1 9.0 9.0  MG  --   --  1.8  --  1.8    Liver Function Tests:  Recent Labs Lab 09/17/15 1307  AST 28  ALT 23  ALKPHOS 85  BILITOT 1.0  PROT 7.1  ALBUMIN 3.6    Recent Labs Lab 09/17/15 1307  LIPASE 18   No results for input(s): AMMONIA in the last 168 hours. Coagulation Profile:  Recent Labs Lab 09/20/15 0550  INR 1.13   Cardiac Enzymes:  Recent  Labs Lab 09/17/15 1937 09/18/15 0000 09/18/15 0601 09/18/15 1133 09/21/15 0918  TROPONINI 0.12* 0.17* 0.15* 0.14* 0.10*   BNP (last 3 results) No results for input(s): PROBNP in the last 8760 hours.  CBG:  Recent Labs Lab 09/21/15 1713 09/21/15 2204 09/22/15 0733 09/22/15 1155 09/22/15 1638  GLUCAP 332* 186* 146* 228* 269*    Studies: No results found.   Scheduled Meds: . carvedilol  12.5 mg Oral BID WC  . furosemide  60 mg Intravenous BID  . guaiFENesin  600 mg Oral BID  . hydrALAZINE  37.5 mg Oral Q8H  . insulin aspart  0-20 Units Subcutaneous TID WC  . insulin aspart  0-5 Units Subcutaneous QHS  . insulin aspart  8 Units Subcutaneous TID WC  . insulin glargine  45 Units Subcutaneous QHS  . isosorbide mononitrate  30 mg Oral Daily  . polyethylene glycol  17 g Oral Daily  . rivaroxaban  15 mg Oral Q lunch  . rosuvastatin  10 mg Oral Daily  . senna-docusate  1 tablet Oral BID  . sodium chloride flush  3 mL Intravenous Q12H   Continuous Infusions:  PRN Meds: sodium chloride, acetaminophen, guaiFENesin, ondansetron (ZOFRAN) IV, sodium chloride flush, zolpidem  Time spent: 30 minutes  Author: Berle Mull, MD [facility] Pager: [phone] 09/22/2015 6:13 PM  If 7PM-7AM, please contact night-coverage at [email or web address], password [facility]

## 2015-09-22 NOTE — Progress Notes (Signed)
Primary cardiologist: Dr. Skeet Latch  Seen for followup: Cardiomyopathy, atrial fibrillation  Subjective:    Appears comfortable at rest. Denies chest pain.  Objective:   Temp:  [97.6 F (36.4 C)-98 F (36.7 C)] 97.6 F (36.4 C) (08/19 0642) Pulse Rate:  [63-82] 82 (08/19 0642) Resp:  [20] 20 (08/19 0642) BP: (122-137)/(65-96) 122/68 (08/19 0642) SpO2:  [95 %-99 %] 95 % (08/19 0642) Weight:  [163 lb 5.8 oz (74.1 kg)] 163 lb 5.8 oz (74.1 kg) (08/19 0642) Last BM Date: 09/22/15  Filed Weights   09/20/15 0440 09/21/15 0637 09/22/15 [id number]  Weight: 161 lb 3.2 oz (73.1 kg) 162 lb 11.2 oz (73.8 kg) 163 lb 5.8 oz (74.1 kg)    Intake/Output Summary (Last 24 hours) at 09/22/15 0927 Last data filed at 09/22/15 0818  Gross per 24 hour  Intake              236 ml  Output             1800 ml  Net            -1564 ml    Telemetry: Atrial fibrillation.  Exam:  General: Overweight woman in no distress.  Lungs: Few crackles at the bases.  Cardiac: Irregularly irregular.  Abdomen: NABS.  Extremities: No pitting edema.  Lab Results:  Basic Metabolic Panel:  Recent Labs Lab 09/20/15 0550 09/21/15 0554 09/22/15 0553  NA 140 140 139  K 3.9 4.4 3.4*  CL 101 103 101  CO2 28 28 28   GLUCOSE 214* 190* 154*  BUN 51* 47* 55*  CREATININE 1.73* 1.73* 1.72*  CALCIUM 9.1 9.0 9.0  MG 1.8  --  1.8    Liver Function Tests:  Recent Labs Lab 09/17/15 1307  AST 28  ALT 23  ALKPHOS 85  BILITOT 1.0  PROT 7.1  ALBUMIN 3.6    CBC:  Recent Labs Lab 09/17/15 1307 09/19/15 0547 09/20/15 0550  WBC 15.6* 12.2* 12.5*  HGB 11.4* 11.0* 11.2*  HCT 35.1* 33.9* 34.2*  MCV 82.8 81.7 80.3  PLT 279 315 248    Cardiac Enzymes:  Recent Labs Lab 09/18/15 0601 09/18/15 1133 09/21/15 0918  TROPONINI 0.15* 0.14* 0.10*    BNP: 991  Coagulation:  Recent Labs Lab 09/20/15 0550  INR 1.13    ECG: Tracing from 09/22/2015 shows atrial fibrillation with RBBB,  LPFB, old anterior infarct pattern.  Echocardiogram 09/18/2015: Study Conclusions  - Left ventricle: The cavity size was normal. There was severe   concentric hypertrophy. Systolic function was moderately to   severely reduced. The estimated ejection fraction was in the   range of 30% to 35%. There is akinesis of the   mid-apicalanteroseptal, anterolateral, inferoseptal, and apical   myocardium. Doppler parameters are consistent with a reversible   restrictive pattern, indicative of decreased left ventricular   diastolic compliance and/or increased left atrial pressure (grade   3 diastolic dysfunction). - Aortic valve: Trileaflet; moderately thickened, moderately   calcified leaflets. Valve mobility was restricted. There was mild   stenosis. Peak velocity (S): 283 cm/s. Mean gradient (S): 14 mm   Hg. - Mitral valve: Calcified annulus. Mildly thickened leaflets . - Left atrium: The atrium was severely dilated. Volume/bsa, ES   (1-plane Simpson&'s, A4C): 54.6 ml/m^2. - Right ventricle: The cavity size was mildly dilated. Wall   thickness was normal. - Pulmonary arteries: Systolic pressure was moderately increased.   PA peak pressure: 52 mm Hg (S).  Impressions:  -  Since prior echocardiogram, aortic stenosis has advanced (prior   mean gradient 37mmHg).  Medications:   Scheduled Medications: . carvedilol  12.5 mg Oral BID WC  . furosemide  60 mg Intravenous BID  . guaiFENesin  600 mg Oral BID  . hydrALAZINE  37.5 mg Oral Q8H  . insulin aspart  0-20 Units Subcutaneous TID WC  . insulin aspart  0-5 Units Subcutaneous QHS  . insulin aspart  8 Units Subcutaneous TID WC  . insulin glargine  30 Units Subcutaneous QHS  . isosorbide mononitrate  30 mg Oral Daily  . polyethylene glycol  17 g Oral Daily  . rivaroxaban  15 mg Oral Q lunch  . rosuvastatin  10 mg Oral Daily  . senna-docusate  1 tablet Oral BID  . sodium chloride flush  3 mL Intravenous Q12H     PRN  Medications: sodium chloride, acetaminophen, ondansetron (ZOFRAN) IV, sodium chloride flush, zolpidem   Assessment:   1. Acute on chronic systolic heart failure, continues to diurese, 1700 cc out more than in the last 24 hours. BNP down to 991.  2. Suspected ischemic cardiomyopathy with LVEF 30-35% wall motion abnormalities as outlined above. Reportedly had myocardial infarction at [facility] last month.  3. Chronic atrial fibrillation, currently on Xarelto, heart rate adequately controlled on Coreg. Lanoxin was discontinued.  4. Mild increase in troponin I, flat pattern most consistent with heart failure rather than ACS.  5. Acute renal insufficiency, creatinine stable at 1.7.   Plan/Discussion:    Current cardiac regimen includes Xarelto,Coreg, hydralazine, Imdur, Crestor and Lasix. Not on ACE inhibitor, ARB, or Aldactone in light of renal insufficiency. She continues to diurese, will continue IV Lasix for now.   Lisa [name], M.D., F.A.C.C.

## 2015-09-22 NOTE — Progress Notes (Signed)
Pt had question about insulin, used interpretor through [employer] system code [phone]. Pt questions were able to be answered by this RN through use of services.

## 2015-09-23 DIAGNOSIS — N184 Chronic kidney disease, stage 4 (severe): Secondary | ICD-10-CM

## 2015-09-23 LAB — BASIC METABOLIC PANEL
ANION GAP: 9 (ref 5–15)
BUN: 61 mg/dL — ABNORMAL HIGH (ref 6–20)
CALCIUM: 9 mg/dL (ref 8.9–10.3)
CO2: 28 mmol/L (ref 22–32)
Chloride: 105 mmol/L (ref 101–111)
Creatinine, Ser: 1.93 mg/dL — ABNORMAL HIGH (ref 0.44–1.00)
GFR calc Af Amer: 28 mL/min — ABNORMAL LOW (ref >60)
GFR, EST NON AFRICAN AMERICAN: 24 mL/min — AB (ref >60)
GLUCOSE: 149 mg/dL — AB (ref 65–99)
Potassium: 3.3 mmol/L — ABNORMAL LOW (ref 3.5–5.1)
SODIUM: 142 mmol/L (ref 135–145)

## 2015-09-23 LAB — GLUCOSE, CAPILLARY
Glucose-Capillary: 158 mg/dL — ABNORMAL HIGH (ref 65–99)
Glucose-Capillary: 292 mg/dL — ABNORMAL HIGH (ref 65–99)
Glucose-Capillary: 256 mg/dL — ABNORMAL HIGH (ref 65–99)
Glucose-Capillary: 291 mg/dL — ABNORMAL HIGH (ref 65–99)

## 2015-09-23 LAB — OCCULT BLOOD X 1 CARD TO LAB, STOOL: Fecal Occult Bld: NEGATIVE

## 2015-09-23 LAB — MAGNESIUM: MAGNESIUM: 1.9 mg/dL (ref 1.7–2.4)

## 2015-09-23 MED ORDER — FUROSEMIDE 10 MG/ML IJ SOLN
40.0000 mg | Freq: Two times a day (BID) | INTRAMUSCULAR | Status: DC
  Administered 2015-09-23 – 2015-09-24 (×2): 40 mg via INTRAVENOUS
  Filled 2015-09-23 (×2): qty 4

## 2015-09-23 MED ORDER — POTASSIUM CHLORIDE CRYS ER 20 MEQ PO TBCR
40.0000 meq | EXTENDED_RELEASE_TABLET | Freq: Once | ORAL | Status: AC
  Administered 2015-09-23: 40 meq via ORAL
  Filled 2015-09-23: qty 2

## 2015-09-23 NOTE — Progress Notes (Signed)
[facility] Progress Note  Patient: Lisa [name] [id number]   PCP: Colonel Bald, MD DOB: 08/06/1935   DOA: 09/17/2015   DOS: 09/23/2015   Date of Service: the patient was seen and examined on 09/23/2015  Subjective:  Mentions that her abdominal pain is less frequent. Chest pain is also resolved. No shortness of breath. Complains about some cough.  Brief hospital course: Lisa [name] is an 80 y.o. female with a PMH of hypertension, stage III CKD (baseline creatinine around 1 post (, chronic combined CHF (EF 40-45 percent by echo in 2016), bicuspid aortic valve, atrial fibrillation on anticoagulation, OSA managed with CPAP, and diabetes who was admitted 09/17/15 for evaluation of abdominal and chest pain associated with lightheadedness, orthopnea, and lower extremity edema. In the ED, she was found to have a troponin of 0.09. CT of her abdomen negative for acute abdominal pathology but did show bilateral pleural effusions suggestive of CHF as well as a small pericardial effusion. BNP was 2,503. Currently further plan is to continue titrating cardiac medication as well as diuresis.  Assessment and Plan: 1. Acute on chronic combined systolic and diastolic heart failure ([place]) Patient takes 40 mg of oral Lasix 3 times a day per medication report. On admission, she was placed on 40 mg of IV Lasix twice a day. No change in weight or significant diuresis noted, although the patient does say she feels a bit better.  Troponin elevation likely secondary to demand ischemia. A 2-D echo was repeated which showed progression of aortic stenosis. Currently the patient is to adjust cardiac medication. Continue IV Lasix. Reduce the dose to 40 mg twice a day  Active Problems:   Essential hypertension Continue Coreg. Diovan on hold secondary to acute kidney injury.    Acute kidney injury in the setting of CKD (chronic kidney disease) stage 3, GFR 15-29 ml/min (HCC) Baseline creatinine around  1. Current creatinine elevated over usual baseline values, suspect from decreased cardiac output due to heart failure exacerbation. Monitor closely with diuresis.    OSA on CPAP Continue CPAP daily at bedtime.    Persistent atrial fibrillation (HCC) /pauses This patients CHA2DS2-VASc Score and unadjusted Ischemic Stroke Rate (% per year) is equal to 9.7 % stroke rate/year from a score of 6(CHF, HTN, DM, Female, Age (2)Heart rate controlled on Coreg and digoxin. On Xarelto chronically, but this was held on admission due to the concerns for dark stools. Fecal occult blood testing is negative, so Xarelto resumed, renally adjusted    Uncontrolled type 2 diabetes mellitus with circulatory disorder, with long-term current use of insulin (HCC) Currently being managed with Lantus and sliding scale insulin. Increasing Lantus to 44 units.    Liver lesion Follow-up recommended. Once patient more medically stable, could proceed with inpatient CT or MRI or schedule as an outpatient non-urgently.    Leukocytosis No evidence of infection. Monitor  Chest pain. Probably noncardiac or associated with CHF. Troponins are unremarkable, EKG also unremarkable.  Constipation. Currently resolved.  Pain management: When necessary Tylenol Activity: Consulted physical therapy Bowel regimen: last BM 09/23/2015 Diet: Cardiac diet DVT Prophylaxis: subcutaneous Heparin  Advance goals of care discussion: Full code  Family Communication: NO family was present at bedside, at the time of interview.  Disposition:  Discharge to home. Expected discharge date: 09/24/2015, pending improvement in CHF  Consultants: Cardiology Procedures: None  Antibiotics: Anti-infectives    None        Intake/Output Summary (Last 24 hours) at 09/23/15 1817 Last data filed  at 09/23/15 1551  Gross per 24 hour  Intake              440 ml  Output             1700 ml  Net            -1260 ml   Filed Weights   09/21/15  0637 09/22/15 0642 09/23/15 0421  Weight: 73.8 kg (162 lb 11.2 oz) 74.1 kg (163 lb 5.8 oz) 72.9 kg (160 lb 11.5 oz)    Objective: Physical Exam: Vitals:   09/23/15 0421 09/23/15 1258 09/23/15 1300 09/23/15 1509  BP: 127/68 (!) 116/46  126/76  Pulse: 73  76 77  Resp: 20  16 18   Temp: 98.3 F (36.8 C)  98.2 F (36.8 C) 98.5 F (36.9 C)  TempSrc: Oral  Oral Oral  SpO2: 97%  98% 98%  Weight: 72.9 kg (160 lb 11.5 oz)     Height:        General: Alert, Awake and Oriented to Time, Place and Person. Appear in moderate distress Eyes: PERRL, Conjunctiva normal ENT: Oral Mucosa clear moist. Neck: mild JVD, no Abnormal Mass Or lumps Cardiovascular: S1 and S2 Present, aortic systolic Murmur, Respiratory: Bilateral Air entry equal and Decreased, basal Crackles, no wheezes Abdomen: Bowel Sound [resent, Soft and no tenderness Skin: no redness, no Rash  Extremities: bilateral Pedal edema, no calf tenderness Neurologic: Grossly no focal neuro deficit. Bilaterally Equal motor strength  Data Reviewed: CBC:  Recent Labs Lab 09/17/15 1307 09/19/15 0547 09/20/15 0550  WBC 15.6* 12.2* 12.5*  HGB 11.4* 11.0* 11.2*  HCT 35.1* 33.9* 34.2*  MCV 82.8 81.7 80.3  PLT 279 315 Q000111Q   Basic Metabolic Panel:  Recent Labs Lab 09/19/15 0547 09/20/15 0550 09/21/15 0554 09/22/15 0553 09/23/15 0606  NA 141 140 140 139 142  K 3.4* 3.9 4.4 3.4* 3.3*  CL 103 101 103 101 105  CO2 28 28 28 28 28   GLUCOSE 105* 214* 190* 154* 149*  BUN 53* 51* 47* 55* 61*  CREATININE 1.76* 1.73* 1.73* 1.72* 1.93*  CALCIUM 8.7* 9.1 9.0 9.0 9.0  MG  --  1.8  --  1.8 1.9    Liver Function Tests:  Recent Labs Lab 09/17/15 1307  AST 28  ALT 23  ALKPHOS 85  BILITOT 1.0  PROT 7.1  ALBUMIN 3.6    Recent Labs Lab 09/17/15 1307  LIPASE 18   No results for input(s): AMMONIA in the last 168 hours. Coagulation Profile:  Recent Labs Lab 09/20/15 0550  INR 1.13   Cardiac Enzymes:  Recent Labs Lab  09/17/15 1937 09/18/15 0000 09/18/15 0601 09/18/15 1133 09/21/15 0918  TROPONINI 0.12* 0.17* 0.15* 0.14* 0.10*   BNP (last 3 results) No results for input(s): PROBNP in the last 8760 hours.  CBG:  Recent Labs Lab 09/22/15 1638 09/22/15 2104 09/23/15 0743 09/23/15 1140 09/23/15 1643  GLUCAP 269* 241* 158* 292* 256*    Studies: No results found.   Scheduled Meds: . carvedilol  12.5 mg Oral BID WC  . furosemide  40 mg Intravenous BID  . guaiFENesin  600 mg Oral BID  . hydrALAZINE  37.5 mg Oral Q8H  . insulin aspart  0-20 Units Subcutaneous TID WC  . insulin aspart  0-5 Units Subcutaneous QHS  . insulin aspart  8 Units Subcutaneous TID WC  . insulin glargine  45 Units Subcutaneous QHS  . isosorbide mononitrate  30 mg Oral Daily  . polyethylene  glycol  17 g Oral Daily  . rivaroxaban  15 mg Oral Q lunch  . rosuvastatin  10 mg Oral Daily  . senna-docusate  1 tablet Oral BID  . sodium chloride flush  3 mL Intravenous Q12H   Continuous Infusions:  PRN Meds: sodium chloride, acetaminophen, guaiFENesin, ondansetron (ZOFRAN) IV, sodium chloride flush, zolpidem  Time spent: 30 minutes  Author: Berle Mull, MD [facility] Pager: [phone] 09/23/2015 6:17 PM  If 7PM-7AM, please contact night-coverage at [email or web address], password [facility]

## 2015-09-23 NOTE — Progress Notes (Signed)
Primary cardiologist: Dr. Skeet Latch  Seen for followup: Cardiomyopathy, atrial fibrillation  Subjective:    No chest pain, still with dyspnea on exertion.  Objective:   Temp:  [97.9 F (36.6 C)-98.3 F (36.8 C)] 98.3 F (36.8 C) (08/20 0421) Pulse Rate:  [67-81] 73 (08/20 0421) Resp:  [18-20] 20 (08/20 0421) BP: (109-127)/(56-68) 127/68 (08/20 0421) SpO2:  [97 %-98 %] 97 % (08/20 0421) Weight:  [160 lb 11.5 oz (72.9 kg)] 160 lb 11.5 oz (72.9 kg) (08/20 0421) Last BM Date: 09/23/15  Filed Weights   09/21/15 0637 09/22/15 0642 09/23/15 0421  Weight: 162 lb 11.2 oz (73.8 kg) 163 lb 5.8 oz (74.1 kg) 160 lb 11.5 oz (72.9 kg)    Intake/Output Summary (Last 24 hours) at 09/23/15 0904 Last data filed at 09/23/15 0807  Gross per 24 hour  Intake              680 ml  Output             1300 ml  Net             -620 ml    Telemetry: Atrial fibrillation.  Exam:  General: Overweight woman in no distress.  Lungs: Few crackles at the bases.  Cardiac: Irregularly irregular.  Abdomen: NABS.  Extremities: No pitting edema.  Lab Results:  Basic Metabolic Panel:  Recent Labs Lab 09/20/15 0550 09/21/15 0554 09/22/15 0553 09/23/15 0606  NA 140 140 139 142  K 3.9 4.4 3.4* 3.3*  CL 101 103 101 105  CO2 28 28 28 28   GLUCOSE 214* 190* 154* 149*  BUN 51* 47* 55* 61*  CREATININE 1.73* 1.73* 1.72* 1.93*  CALCIUM 9.1 9.0 9.0 9.0  MG 1.8  --  1.8 1.9    Liver Function Tests:  Recent Labs Lab 09/17/15 1307  AST 28  ALT 23  ALKPHOS 85  BILITOT 1.0  PROT 7.1  ALBUMIN 3.6    CBC:  Recent Labs Lab 09/17/15 1307 09/19/15 0547 09/20/15 0550  WBC 15.6* 12.2* 12.5*  HGB 11.4* 11.0* 11.2*  HCT 35.1* 33.9* 34.2*  MCV 82.8 81.7 80.3  PLT 279 315 248    Cardiac Enzymes:  Recent Labs Lab 09/18/15 0601 09/18/15 1133 09/21/15 0918  TROPONINI 0.15* 0.14* 0.10*    BNP: 991  Coagulation:  Recent Labs Lab 09/20/15 0550  INR 1.13    ECG:  Tracing from 09/22/2015 shows atrial fibrillation with RBBB, LPFB, old anterior infarct pattern.  Echocardiogram 09/18/2015: Study Conclusions  - Left ventricle: The cavity size was normal. There was severe   concentric hypertrophy. Systolic function was moderately to   severely reduced. The estimated ejection fraction was in the   range of 30% to 35%. There is akinesis of the   mid-apicalanteroseptal, anterolateral, inferoseptal, and apical   myocardium. Doppler parameters are consistent with a reversible   restrictive pattern, indicative of decreased left ventricular   diastolic compliance and/or increased left atrial pressure (grade   3 diastolic dysfunction). - Aortic valve: Trileaflet; moderately thickened, moderately   calcified leaflets. Valve mobility was restricted. There was mild   stenosis. Peak velocity (S): 283 cm/s. Mean gradient (S): 14 mm   Hg. - Mitral valve: Calcified annulus. Mildly thickened leaflets . - Left atrium: The atrium was severely dilated. Volume/bsa, ES   (1-plane Simpson&'s, A4C): 54.6 ml/m^2. - Right ventricle: The cavity size was mildly dilated. Wall   thickness was normal. - Pulmonary arteries: Systolic pressure was moderately increased.  PA peak pressure: 52 mm Hg (S).  Impressions:  - Since prior echocardiogram, aortic stenosis has advanced (prior   mean gradient 64mmHg).  Medications:   Scheduled Medications: . carvedilol  12.5 mg Oral BID WC  . furosemide  60 mg Intravenous BID  . guaiFENesin  600 mg Oral BID  . hydrALAZINE  37.5 mg Oral Q8H  . insulin aspart  0-20 Units Subcutaneous TID WC  . insulin aspart  0-5 Units Subcutaneous QHS  . insulin aspart  8 Units Subcutaneous TID WC  . insulin glargine  45 Units Subcutaneous QHS  . isosorbide mononitrate  30 mg Oral Daily  . polyethylene glycol  17 g Oral Daily  . rivaroxaban  15 mg Oral Q lunch  . rosuvastatin  10 mg Oral Daily  . senna-docusate  1 tablet Oral BID  . sodium  chloride flush  3 mL Intravenous Q12H     PRN Medications: sodium chloride, acetaminophen, guaiFENesin, ondansetron (ZOFRAN) IV, sodium chloride flush, zolpidem   Assessment:   1. Acute on chronic systolic heart failure, continues to diurese, 600 cc out more than in the last 24 hours.  2. Suspected ischemic cardiomyopathy with LVEF 30-35% wall motion abnormalities as outlined above. Reportedly had myocardial infarction at [facility] last month.  3. Chronic atrial fibrillation, currently on Xarelto, heart rate adequately controlled on Coreg. Lanoxin was discontinued.  4. Mild increase in troponin I, flat pattern most consistent with heart failure rather than ACS.  5. Acute renal insufficiency, creatinine up to 1.9 from 1.7.   Plan/Discussion:    With bump in creatinine would cut back Lasix to 40 mg IV twice daily. Otherwise heart rate and blood pressure control look to be adequate on present regimen.   Satira Sark, M.D., F.A.C.C.

## 2015-09-23 NOTE — Progress Notes (Signed)
ANTICOAGULATION CONSULT NOTE - Initial Consult  Pharmacy Consult for xarelto Indication: atrial fibrillation  Allergies  Allergen Reactions  . Lisinopril Cough    Patient Measurements: Height: 5\' 1"  (154.9 cm) Weight: 160 lb 11.5 oz (72.9 kg) IBW/kg (Calculated) : 47.8 Heparin Dosing Weight:   Vital Signs: Temp: 98.3 F (36.8 C) (08/20 0421) Temp Source: Oral (08/20 0421) BP: 127/68 (08/20 0421) Pulse Rate: 73 (08/20 0421)  Labs:  Recent Labs  09/21/15 0554 09/21/15 0918 09/22/15 0553 09/23/15 0606  CREATININE 1.73*  --  1.72* 1.93*  TROPONINI  --  0.10*  --   --     Estimated Creatinine Clearance: 22.3 mL/min (by C-G formula based on SCr of 1.93 mg/dL).   Medical History: Past Medical History:  Diagnosis Date  . Anemia   . Carotid arterial disease ([place])   . Chronic combined systolic and diastolic CHF (congestive heart failure) ([place])    a. 08/2014 Echo: EF 40-45%, no rwma, bicuspid AoV, mild AS, mod dil Asc Ao, PASP 50mmHg.  . CKD (chronic kidney disease), stage III   . Diabetes mellitus without complication ([place])   . Essential hypertension   . Mild aortic stenosis    a. 08/2014 Echo: mild AS, bicuspid AoV.  . OSA on CPAP   . Osteoarthritis of both ankles   . Persistent atrial fibrillation ([place])    a. Dx 08/2013, CHA2DS2VASc=5-->Xarelto.  . Vitamin D deficiency     Assessment: Patient's a 80 y.o F with hx CKD 3 and afib (CHADSVASC 6) on xarelto 20 mg daily  PTA, presented to the ED on 8/14 with c/o abd pain and dark stools.  Xarelto was held on admission for r/o GIB.  Hemeoccult (-).  To resume xarelto on 8/17.  Today, 09/23/2015 - SCr increased 1.93 with diuresis - CrCl 28 ml/min using total body weight - Last CBC 8/17 was stable - FOB (-) x 2   Plan:  - Continue xarelto 15 mg PO daily (adjusted for renal funct) - Monitor for s/s bleeding - F/u CBC in AM  Peggyann Juba, PharmD, BCPS Pager: [phone] 09/23/2015,10:16 AM

## 2015-09-24 LAB — CBC
HEMATOCRIT: 34.1 % — AB (ref 36.0–46.0)
HEMOGLOBIN: 11.1 g/dL — AB (ref 12.0–15.0)
MCH: 26.6 pg (ref 26.0–34.0)
MCHC: 32.6 g/dL (ref 30.0–36.0)
MCV: 81.8 fL (ref 78.0–100.0)
Platelets: 292 10*3/uL (ref 150–400)
RBC: 4.17 MIL/uL (ref 3.87–5.11)
RDW: 15.2 % (ref 11.5–15.5)
WBC: 11.4 10*3/uL — ABNORMAL HIGH (ref 4.0–10.5)

## 2015-09-24 LAB — BASIC METABOLIC PANEL
Anion gap: 9 (ref 5–15)
BUN: 67 mg/dL — ABNORMAL HIGH (ref 6–20)
CALCIUM: 8.8 mg/dL — AB (ref 8.9–10.3)
CHLORIDE: 105 mmol/L (ref 101–111)
CO2: 26 mmol/L (ref 22–32)
CREATININE: 1.89 mg/dL — AB (ref 0.44–1.00)
GFR calc non Af Amer: 25 mL/min — ABNORMAL LOW (ref >60)
GFR, EST AFRICAN AMERICAN: 28 mL/min — AB (ref >60)
GLUCOSE: 175 mg/dL — AB (ref 65–99)
Potassium: 3.5 mmol/L (ref 3.5–5.1)
Sodium: 140 mmol/L (ref 135–145)

## 2015-09-24 LAB — GLUCOSE, CAPILLARY
GLUCOSE-CAPILLARY: 159 mg/dL — AB (ref 65–99)
Glucose-Capillary: 189 mg/dL — ABNORMAL HIGH (ref 65–99)
Glucose-Capillary: 198 mg/dL — ABNORMAL HIGH (ref 65–99)
Glucose-Capillary: 237 mg/dL — ABNORMAL HIGH (ref 65–99)

## 2015-09-24 MED ORDER — POTASSIUM CHLORIDE CRYS ER 20 MEQ PO TBCR
40.0000 meq | EXTENDED_RELEASE_TABLET | Freq: Once | ORAL | Status: AC
Start: 2015-09-24 — End: 2015-09-24
  Administered 2015-09-24: 40 meq via ORAL
  Filled 2015-09-24: qty 2

## 2015-09-24 MED ORDER — FUROSEMIDE 40 MG PO TABS
40.0000 mg | ORAL_TABLET | Freq: Two times a day (BID) | ORAL | Status: DC
  Administered 2015-09-24 – 2015-09-25 (×2): 40 mg via ORAL
  Filled 2015-09-24 (×2): qty 1

## 2015-09-24 MED ORDER — METOCLOPRAMIDE HCL 10 MG PO TABS
10.0000 mg | ORAL_TABLET | Freq: Three times a day (TID) | ORAL | Status: DC
  Administered 2015-09-24 – 2015-09-25 (×3): 10 mg via ORAL
  Filled 2015-09-24 (×3): qty 1

## 2015-09-24 MED ORDER — ACETAMINOPHEN 325 MG PO TABS
650.0000 mg | ORAL_TABLET | Freq: Once | ORAL | Status: AC
  Administered 2015-09-24: 650 mg via ORAL
  Filled 2015-09-24: qty 2

## 2015-09-24 MED ORDER — BUTALBITAL-APAP-CAFFEINE 50-325-40 MG PO TABS
1.0000 | ORAL_TABLET | Freq: Four times a day (QID) | ORAL | Status: DC | PRN
Start: 2015-09-24 — End: 2015-09-25
  Administered 2015-09-24: 1 via ORAL
  Filled 2015-09-24: qty 1

## 2015-09-24 MED ORDER — DOCUSATE SODIUM 100 MG PO CAPS
100.0000 mg | ORAL_CAPSULE | Freq: Every day | ORAL | Status: DC
  Administered 2015-09-24 – 2015-09-25 (×2): 100 mg via ORAL
  Filled 2015-09-24 (×2): qty 1

## 2015-09-24 NOTE — Progress Notes (Signed)
Pt refused CPAP qhs.  Education provided.  RT will continue to monitor as needed. 

## 2015-09-24 NOTE — Progress Notes (Signed)
[facility] Progress Note  Patient: Lisa [name] [id number]   PCP: Colonel Bald, MD DOB: Nov 10, 1935   DOA: 09/17/2015   DOS: 09/24/2015   Date of Service: the patient was seen and examined on 09/24/2015  Subjective: Patient is mentioning that her breathing and chest pain and abdominal pain is completely better. Denies any acute complaint. Her primary complaint right now his headache as well as cough.  Brief hospital course: Lisa [name] is an 80 y.o. female with a PMH of hypertension, stage III CKD (baseline creatinine around 1 post (, chronic combined CHF (EF 40-45 percent by echo in 2016), bicuspid aortic valve, atrial fibrillation on anticoagulation, OSA managed with CPAP, and diabetes who was admitted 09/17/15 for evaluation of abdominal and chest pain associated with lightheadedness, orthopnea, and lower extremity edema. In the ED, she was found to have a troponin of 0.09. CT of her abdomen negative for acute abdominal pathology but did show bilateral pleural effusions suggestive of CHF as well as a small pericardial effusion. BNP was 2,503. Currently further plan is to continue titrating cardiac medication as well as diuresis.  Assessment and Plan: 1. Acute on chronic combined systolic and diastolic heart failure ([place]) Patient takes 40 mg of oral Lasix 3 times a day per medication report. On admission, she was placed on 40 mg of IV Lasix twice a day. No change in weight or significant diuresis noted, although the patient does say she feels a bit better.  Troponin elevation likely secondary to demand ischemia. A 2-D echo was repeated which showed progression of aortic stenosis. Highly appreciated cartilage input in assessment and management. Transitioning to oral Lasix and monitoring the patient overnight.  Active Problems:   Essential hypertension Continue Coreg. Diovan on hold secondary to acute kidney injury.    Acute kidney injury in the setting of CKD (chronic  kidney disease) stage 3, GFR 15-29 ml/min (HCC) Baseline creatinine around 1. Current creatinine elevated over usual baseline values, suspect from decreased cardiac output due to heart failure exacerbation. Monitor closely with diuresis.    OSA on CPAP Continue CPAP daily at bedtime.    Persistent atrial fibrillation (HCC) /pauses This patients CHA2DS2-VASc Score and unadjusted Ischemic Stroke Rate (% per year) is equal to 9.7 % stroke rate/year from a score of 6(CHF, HTN, DM, Female, Age (2)Heart rate controlled on Coreg and digoxin. On Xarelto chronically, but this was held on admission due to the concerns for dark stools. Fecal occult blood testing is negative, so Xarelto resumed, renally adjusted    Uncontrolled type 2 diabetes mellitus with circulatory disorder, with long-term current use of insulin (HCC) Currently being managed with Lantus and sliding scale insulin. Increasing Lantus to 44 units.    Liver lesion Follow-up recommended. Once patient more medically stable, could proceed with inpatient CT or MRI or schedule as an outpatient non-urgently.    Leukocytosis No evidence of infection. Monitor  Chest pain. Probably noncardiac or associated with CHF. Troponins are unremarkable, EKG also unremarkable.  Constipation. Currently resolved.  Pain management: When necessary Tylenol Activity: Consulted physical therapy Bowel regimen: last BM 09/23/2015 Diet: Cardiac diet DVT Prophylaxis: subcutaneous Heparin  Advance goals of care discussion: Full code  Family Communication: NO family was present at bedside, at the time of interview.  Disposition:  Discharge to home. Expected discharge date: 09/25/2015, pending improvement in CHF  Consultants: Cardiology Procedures: None  Antibiotics: Anti-infectives    None        Intake/Output Summary (Last 24 hours) at  09/24/15 1755 Last data filed at 09/24/15 1754  Gross per 24 hour  Intake              963 ml  Output              1301 ml  Net             -338 ml   Filed Weights   09/22/15 0642 09/23/15 0421 09/24/15 0614  Weight: 74.1 kg (163 lb 5.8 oz) 72.9 kg (160 lb 11.5 oz) 73.3 kg (161 lb 8 oz)    Objective: Physical Exam: Vitals:   09/23/15 1509 09/23/15 2251 09/24/15 0614 09/24/15 1428  BP: 126/76 122/78 124/71 (!) 115/57  Pulse: 77 75 71 70  Resp: 18 18 18 20   Temp: 98.5 F (36.9 C) 97.8 F (36.6 C) 97.7 F (36.5 C) 98.1 F (36.7 C)  TempSrc: Oral Oral Oral Oral  SpO2: 98% 98% 98% 98%  Weight:   73.3 kg (161 lb 8 oz)   Height:        General: Alert, Awake and Oriented to Time, Place and Person. Appear in moderate distress Eyes: PERRL, Conjunctiva normal ENT: Oral Mucosa clear moist. Neck: mild JVD, no Abnormal Mass Or lumps Cardiovascular: S1 and S2 Present, aortic systolic Murmur, Respiratory: Bilateral Air entry equal and Decreased, basal Crackles, no wheezes Abdomen: Bowel Sound [resent, Soft and no tenderness Skin: no redness, no Rash  Extremities: bilateral Pedal edema, no calf tenderness Neurologic: Grossly no focal neuro deficit. Bilaterally Equal motor strength  Data Reviewed: CBC:  Recent Labs Lab 09/19/15 0547 09/20/15 0550 09/24/15 0543  WBC 12.2* 12.5* 11.4*  HGB 11.0* 11.2* 11.1*  HCT 33.9* 34.2* 34.1*  MCV 81.7 80.3 81.8  PLT 315 248 [id number]   Basic Metabolic Panel:  Recent Labs Lab 09/20/15 0550 09/21/15 0554 09/22/15 0553 09/23/15 0606 09/24/15 0543  NA 140 140 139 142 140  K 3.9 4.4 3.4* 3.3* 3.5  CL 101 103 101 105 105  CO2 28 28 28 28 26   GLUCOSE 214* 190* 154* 149* 175*  BUN 51* 47* 55* 61* 67*  CREATININE 1.73* 1.73* 1.72* 1.93* 1.89*  CALCIUM 9.1 9.0 9.0 9.0 8.8*  MG 1.8  --  1.8 1.9  --     Liver Function Tests: No results for input(s): AST, ALT, ALKPHOS, BILITOT, PROT, ALBUMIN in the last 168 hours. No results for input(s): LIPASE, AMYLASE in the last 168 hours. No results for input(s): AMMONIA in the last 168 hours. Coagulation  Profile:  Recent Labs Lab 09/20/15 0550  INR 1.13   Cardiac Enzymes:  Recent Labs Lab 09/17/15 1937 09/18/15 0000 09/18/15 0601 09/18/15 1133 09/21/15 0918  TROPONINI 0.12* 0.17* 0.15* 0.14* 0.10*   BNP (last 3 results) No results for input(s): PROBNP in the last 8760 hours.  CBG:  Recent Labs Lab 09/23/15 1643 09/23/15 2248 09/24/15 0741 09/24/15 1158 09/24/15 1607  GLUCAP 256* 291* 189* 159* 198*    Studies: No results found.   Scheduled Meds: . carvedilol  12.5 mg Oral BID WC  . docusate sodium  100 mg Oral Daily  . furosemide  40 mg Oral BID  . guaiFENesin  600 mg Oral BID  . hydrALAZINE  37.5 mg Oral Q8H  . insulin aspart  0-20 Units Subcutaneous TID WC  . insulin aspart  0-5 Units Subcutaneous QHS  . insulin aspart  8 Units Subcutaneous TID WC  . insulin glargine  45 Units Subcutaneous QHS  . isosorbide mononitrate  30 mg Oral Daily  . metoCLOPramide  10 mg Oral TID AC  . polyethylene glycol  17 g Oral Daily  . rivaroxaban  15 mg Oral Q lunch  . rosuvastatin  10 mg Oral Daily  . senna-docusate  1 tablet Oral BID  . sodium chloride flush  3 mL Intravenous Q12H   Continuous Infusions:  PRN Meds: sodium chloride, acetaminophen, butalbital-acetaminophen-caffeine, guaiFENesin, ondansetron (ZOFRAN) IV, sodium chloride flush, zolpidem  Time spent: 30 minutes  Author: Berle Mull, MD [facility] Pager: [phone] 09/24/2015 5:55 PM  If 7PM-7AM, please contact night-coverage at [email or web address], password [facility]

## 2015-09-24 NOTE — Progress Notes (Addendum)
Hospital Problem List     Principal Problem:   Acute on chronic combined systolic and diastolic heart failure (HCC) Active Problems:   Essential hypertension   OSA on CPAP   Persistent atrial fibrillation ([place])   Uncontrolled type 2 diabetes mellitus with circulatory disorder, with long-term current use of insulin (HCC)   Leukocytosis   Elevated troponin   Chest pain   Acute kidney injury ([place])   Sinus pause   Liver lesion    Patient Profile:   Primary Cardiologist: Dr. Oval Linsey  80 y.o.female w/ PMH of chronic combined systolic and diastolic CHF (EF [id number] by echo in 08/2014), persistent atrial fibrillation (on Xarelto), mild AS, OSA (on CPAP), CKD, and Type 2 DM who presented to [facility] ED on 09/17/2015 for abdominal pain and nausea. Cards consulted for CHF exacerbation.  Subjective   [place] Interpreter Used: ID # [id number]  Reports having a headache and nausea earlier this AM. Saying she could taste the saline when given IV Lasix.   Still feels constipated but says she had a bowel movement this AM. Denies any chest discomfort or palpitations. Breathing is at baseline.   Inpatient Medications    . carvedilol  12.5 mg Oral BID WC  . furosemide  40 mg Intravenous BID  . guaiFENesin  600 mg Oral BID  . hydrALAZINE  37.5 mg Oral Q8H  . insulin aspart  0-20 Units Subcutaneous TID WC  . insulin aspart  0-5 Units Subcutaneous QHS  . insulin aspart  8 Units Subcutaneous TID WC  . insulin glargine  45 Units Subcutaneous QHS  . isosorbide mononitrate  30 mg Oral Daily  . polyethylene glycol  17 g Oral Daily  . rivaroxaban  15 mg Oral Q lunch  . rosuvastatin  10 mg Oral Daily  . senna-docusate  1 tablet Oral BID  . sodium chloride flush  3 mL Intravenous Q12H    Vital Signs    Vitals:   09/23/15 1300 09/23/15 1509 09/23/15 2251 09/24/15 0614  BP:  126/76 122/78 124/71  Pulse: 76 77 75 71  Resp: 16 18 18 18   Temp: 98.2 F (36.8 C) 98.5 F (36.9 C) 97.8 F (36.6  C) 97.7 F (36.5 C)  TempSrc: Oral Oral Oral Oral  SpO2: 98% 98% 98% 98%  Weight:    161 lb 8 oz (73.3 kg)  Height:        Intake/Output Summary (Last 24 hours) at 09/24/15 1032 Last data filed at 09/24/15 1006  Gross per 24 hour  Intake              483 ml  Output             1650 ml  Net            -1167 ml   Filed Weights   09/22/15 0642 09/23/15 0421 09/24/15 0614  Weight: 163 lb 5.8 oz (74.1 kg) 160 lb 11.5 oz (72.9 kg) 161 lb 8 oz (73.3 kg)    Physical Exam    General: Well developed, well nourished, female appearing in no acute distress. Head: Normocephalic, atraumatic.  Neck: Supple without bruits, JVD not elevated. Lungs:  Resp regular and unlabored, minimal rales at bases. Heart: Irregularly irregular, S1, S2, no S3, S4, or murmur; no rub. Abdomen: Soft, non-tender, non-distended with normoactive bowel sounds. No hepatomegaly. No rebound/guarding. No obvious abdominal masses. Extremities: No clubbing, cyanosis, or edema. Distal pedal pulses are 2+ bilaterally. Neuro: Alert and oriented X  3. Moves all extremities spontaneously. Psych: Normal affect.  Labs    CBC  Recent Labs  09/24/15 0543  WBC 11.4*  HGB 11.1*  HCT 34.1*  MCV 81.8  PLT 123456   Basic Metabolic Panel  Recent Labs  09/22/15 0553 09/23/15 0606 09/24/15 0543  NA 139 142 140  K 3.4* 3.3* 3.5  CL 101 105 105  CO2 28 28 26   GLUCOSE 154* 149* 175*  BUN 55* 61* 67*  CREATININE 1.72* 1.93* 1.89*  CALCIUM 9.0 9.0 8.8*  MG 1.8 1.9  --      Telemetry    Atrial fibrillation, HR in 60's - 70's.   ECG    No new tracings.   Cardiac Studies and Radiology    Dg Chest Port 1 View  Result Date: 09/20/2015 CLINICAL DATA:  CHF exacerbation. EXAM: PORTABLE CHEST 1 VIEW COMPARISON:  09/17/2015. FINDINGS: Stable enlarged cardiac silhouette. Decreased prominence of the pulmonary vasculature. Increased prominence of the interstitial markings. Bilateral pleural effusions and bibasilar airspace  opacity. Thoracic spine degenerative changes. Diffuse osteopenia. Aortic calcifications. IMPRESSION: 1. Progressive changes of congestive heart failure with the exception of less pulmonary vascular congestion. 2. Bibasilar atelectasis. 3. Aortic atherosclerosis. Electronically Signed   By: Claudie Revering M.D.   On: 09/20/2015 13:39   Dg Abdomen Acute W/chest  Result Date: 09/17/2015 CLINICAL DATA:  Abdominal pain, chest pain EXAM: DG ABDOMEN ACUTE W/ 1V CHEST COMPARISON:  None. FINDINGS: Central vascular congestion and mild perihilar interstitial prominence suspicious for mild interstitial edema. Small right pleural effusion with right basilar atelectasis or infiltrate. There is normal small bowel gas pattern. No evidence of free abdominal air. IMPRESSION: Central mild vascular congestion and mild perihilar interstitial prominence suspicious for mild interstitial edema. Small right pleural effusion with right basilar atelectasis or infiltrate. Normal small bowel gas pattern. Electronically Signed   By: Lahoma Crocker M.D.   On: 09/17/2015 14:54   Ct Renal Stone Study  Result Date: 09/17/2015 CLINICAL DATA:  Left lower abdominal pain and nausea. Congestive heart failure and chronic kidney disease stage 3. EXAM: CT ABDOMEN AND PELVIS WITHOUT CONTRAST TECHNIQUE: Multidetector CT imaging of the abdomen and pelvis was performed following the standard protocol without IV contrast. COMPARISON:  None. FINDINGS: Lower chest: Small bilateral pleural effusions and bibasilar atelectasis. Bibasilar interstitial prominence suspicious for pulmonary edema. Moderate cardiomegaly and small pericardial effusion. Aortic atherosclerosis and coronary artery calcification. Hepatobiliary: No mass visualized on this un-enhanced exam. Pancreas: No mass or inflammatory process identified on this un-enhanced exam. Spleen: Within normal limits in size. Adrenals/Urinary Tract: No evidence of urolithiasis or hydronephrosis. Unopacified urinary  bladder is unremarkable in appearance. Stomach/Bowel: No evidence of obstruction, inflammatory process, or abnormal fluid collections. Normal appendix visualized. Vascular/Lymphatic: No pathologically enlarged lymph nodes. No evidence of abdominal aortic aneurysm. Aortic atherosclerosis noted. Reproductive: No mass or other significant abnormality. Other: None. Musculoskeletal:  No suspicious bone lesions identified. IMPRESSION: No acute findings within the abdomen or pelvis. Cardiomegaly, bibasilar pulmonary interstitial prominence and small bilateral pleural effusions, suspicious for congestive heart failure. Small pericardial effusion. Aortic atherosclerosis and coronary artery calcification. Electronically Signed   By: Earle Gell M.D.   On: 09/17/2015 15:52   US Abdomen Limited Ruq  Result Date: 09/17/2015 CLINICAL DATA:  Epigastric abdominal pain. EXAM: US ABDOMEN LIMITED - RIGHT UPPER QUADRANT COMPARISON:  CT abdomen and pelvis 09/17/2015. Report of CT abdomen and pelvis with contrast 07/14/2008 and MRI abdomen 08/06/2007 FINDINGS: Gallbladder: Surgically absence Common bile duct: Diameter: Within  normal limits following cholecystectomy and 11 mm. No obstructing mass lesion is present. Liver: A focal hyperechoic lesion is present at the inferior aspect of the right lobe of the liver measuring 3.3 x 1.9 x 2.9 cm. No other focal lesions are present. A right pleural effusion is noted. IMPRESSION: 1. No acute or focal lesion to explain the patient's abdominal pain. 2. **An incidental finding of potential clinical significance has been found. 3.3 cm hyperechoic lesion at the inferior aspect of the right lobe of the liver was not previously reported. This is nonspecific and most likely represents a hemangioma. This lesion was not discretely seen on the noncontrast CT of the abdomen and pelvis. It was not described on previous CT or MRI from 2010 and 2009. Follow-up CT of the abdomen only or MRI without and with  contrast is recommended for further evaluation.** Electronically Signed   By: San Morelle M.D.   On: 09/17/2015 18:19    Echocardiogram: 09/18/2015 Study Conclusions  - Left ventricle: The cavity size was normal. There was severe   concentric hypertrophy. Systolic function was moderately to   severely reduced. The estimated ejection fraction was in the   range of 30% to 35%. There is akinesis of the   mid-apicalanteroseptal, anterolateral, inferoseptal, and apical   myocardium. Doppler parameters are consistent with a reversible   restrictive pattern, indicative of decreased left ventricular   diastolic compliance and/or increased left atrial pressure (grade   3 diastolic dysfunction). - Aortic valve: Trileaflet; moderately thickened, moderately   calcified leaflets. Valve mobility was restricted. There was mild   stenosis. Peak velocity (S): 283 cm/s. Mean gradient (S): 14 mm   Hg. - Mitral valve: Calcified annulus. Mildly thickened leaflets . - Left atrium: The atrium was severely dilated. Volume/bsa, ES   (1-plane Simpson&'s, A4C): 54.6 ml/m^2. - Right ventricle: The cavity size was mildly dilated. Wall   thickness was normal. - Pulmonary arteries: Systolic pressure was moderately increased.   PA peak pressure: 52 mm Hg (S).  Impressions: - Since prior echocardiogram, aortic stenosis has advanced (prior   mean gradient 103mmHg).   Assessment & Plan    1. Acute on Chronic Combined Systolic and Diastolic CHF - echo in Q000111Q showed EF of 40-45%. Repeat echo this admission shows EF of 30% to 35%. There is akinesis of the mid-apicalanteroseptal, anterolateral, inferoseptal, and apical myocardium. Reports having an MI at [facility] last month. Need to obtain these records. - BNP 1492 on admission and CXR showing central mild vascular congestion and mild perihilar interstitial prominence suspicious for mild interstitial edema. Small right pleural effusion  with right basilar atelectasis or infiltrate.  - started on IV Lasix 60mg  BID with a recorded output of -7.6L thus far. Dose decreased to IV Lasix 40mg  BID with rising creatinine. Weight at 161lbs, down 8 pounds since admission. Volume status improved on exam. Could possibly transition back to PO Lasix. Was on PO Lasix 40mg  TID prior to admission.  - continue Coreg 12.5mg  BID. ARB currently held with AKI. Dig level up to 1.6 (has been discontinued). Continue Imdur 30mg  daily and Hydralazine 37.5mg  Q8H.   2. Chronic Atrial Fibrillation -was taking both Lopressor and Coreg PTA instead of Coreg alone. Pauses up to 2.1 seconds noted on telemetry, likely secondary to taking multiple AV nodal blocking agents. No recurrent pauses > 2.0s since. Continue Coreg for rate-control. - This patients CHA2DS2-VASc Score and unadjusted Ischemic Stroke Rate (% per year) is equal to 9.7 %  stroke rate/year from a score of 6(CHF, HTN, DM, Female, Age (2)). On Xarelto PTA, held due to patient reporting dark tarry stools. Occult blood negative and Hgb stable. Xarelto resumed at 15mg  daily (adjusted dose for renal dysfunction).  3. Elevated Troponin - Cyclic troponin values have been 0.12, 0.17, 0.15, and 0.14. Flat trend, likely secondary to CHF exacerbation and AKI.  - reported having an MI earlier this year at [facility]. No records available for review.  4. AKI - creatinine 1.89 today (was 0.99 two months ago).   5. Abdominal Pain - Abdominal US with no acute or focal lesion to explain the patient's abdominal pain but anincidental finding of a3.3 cm hyperechoic lesion at the inferior aspect of the right lobe of the liver was noted, likely representinga hemangioma. F/U CT or MRI recommended. - per admitting team  6. Hypokalemia - resolved, K+ 3.5this AM. Will order supplementation.   7. NSVT - no recurrence on telemetry.   Arna Medici , PA-C 10:32 AM 09/24/2015 Pager:  [phone]  The patient was seen, examined and discussed with Bernerd Pho, PA-C and I agree with the above.    80 y.o.female w/ PMH of chronic combined systolic and diastolic CHF (EF [id number] by echo in 08/2014), persistent atrial fibrillation (on Xarelto), mild AS, OSA (on CPAP), CKD, and Type 2 DM who presented to [facility] ED on 09/17/2015 for abdominal pain and nausea. Cards consulted for CHF exacerbation. She diuresed total of 7.6 L, now 161 lbs, 176 lbs in May 2017, she doesn't appear to be fluid overloaded, we will switch to oral lasix 40 mg po BID and see how she tolerates it. Crea is slightly worse, we will follow. She is complaining of headache and constipation, I will give her metoclopromide and colace, tylenol for headache. Anticipated discharge tomorrow if she feels well.   Ena Dawley 09/24/2015

## 2015-09-25 ENCOUNTER — Encounter: Payer: Medicare Other | Admitting: Nutrition

## 2015-09-25 LAB — CBC
HCT: 34.7 % — ABNORMAL LOW (ref 36.0–46.0)
Hemoglobin: 11.2 g/dL — ABNORMAL LOW (ref 12.0–15.0)
MCH: 26.3 pg (ref 26.0–34.0)
MCHC: 32.3 g/dL (ref 30.0–36.0)
MCV: 81.5 fL (ref 78.0–100.0)
PLATELETS: 276 10*3/uL (ref 150–400)
RBC: 4.26 MIL/uL (ref 3.87–5.11)
RDW: 15.4 % (ref 11.5–15.5)
WBC: 10.6 10*3/uL — AB (ref 4.0–10.5)

## 2015-09-25 LAB — BASIC METABOLIC PANEL
Anion gap: 8 (ref 5–15)
BUN: 66 mg/dL — AB (ref 6–20)
CALCIUM: 9.1 mg/dL (ref 8.9–10.3)
CO2: 24 mmol/L (ref 22–32)
CREATININE: 1.69 mg/dL — AB (ref 0.44–1.00)
Chloride: 108 mmol/L (ref 101–111)
GFR calc non Af Amer: 28 mL/min — ABNORMAL LOW (ref >60)
GFR, EST AFRICAN AMERICAN: 33 mL/min — AB (ref >60)
Glucose, Bld: 166 mg/dL — ABNORMAL HIGH (ref 65–99)
Potassium: 3.6 mmol/L (ref 3.5–5.1)
SODIUM: 140 mmol/L (ref 135–145)

## 2015-09-25 LAB — GLUCOSE, CAPILLARY
GLUCOSE-CAPILLARY: 177 mg/dL — AB (ref 65–99)
GLUCOSE-CAPILLARY: 210 mg/dL — AB (ref 65–99)

## 2015-09-25 MED ORDER — RIVAROXABAN 15 MG PO TABS: 15.0000 mg | ORAL_TABLET | Freq: Every day | ORAL | 0 refills | Status: DC

## 2015-09-25 MED ORDER — GUAIFENESIN ER 600 MG PO TB12: 600.0000 mg | ORAL_TABLET | Freq: Two times a day (BID) | ORAL | 0 refills | Status: DC

## 2015-09-25 MED ORDER — DOCUSATE SODIUM 100 MG PO CAPS: 100.0000 mg | ORAL_CAPSULE | Freq: Every day | ORAL | 0 refills | Status: DC

## 2015-09-25 MED ORDER — BUTALBITAL-APAP-CAFFEINE 50-325-40 MG PO TABS: 1.0000 | ORAL_TABLET | Freq: Four times a day (QID) | ORAL | 0 refills | Status: DC | PRN

## 2015-09-25 MED ORDER — INSULIN GLARGINE 100 UNIT/ML ~~LOC~~ SOLN: 40.0000 [IU] | Freq: Every day | SUBCUTANEOUS | 0 refills | Status: DC

## 2015-09-25 MED ORDER — POLYETHYLENE GLYCOL 3350 17 G PO PACK: 17.0000 g | PACK | Freq: Every day | ORAL | 0 refills | Status: AC

## 2015-09-25 MED ORDER — CARVEDILOL 12.5 MG PO TABS: 12.5000 mg | ORAL_TABLET | Freq: Two times a day (BID) | ORAL | 0 refills | Status: DC

## 2015-09-25 MED ORDER — HYDRALAZINE HCL 25 MG PO TABS: 37.5000 mg | ORAL_TABLET | Freq: Three times a day (TID) | ORAL | 0 refills | Status: DC

## 2015-09-25 MED ORDER — FUROSEMIDE 40 MG PO TABS: 40.0000 mg | ORAL_TABLET | Freq: Two times a day (BID) | ORAL | 0 refills | Status: DC

## 2015-09-25 NOTE — Care Management Important Message (Signed)
Important Message  Patient Details  Name: Quinasia Gussler MRN: [id number] Date of Birth: [birth year]   Medicare Important Message Given:  Yes    Camillo Flaming 09/25/2015, 9:06 AMImportant Message  Patient Details  Name: Halimatou Boring MRN: [id number] Date of Birth: January 27, 1936   Medicare Important Message Given:  Yes    Camillo Flaming 09/25/2015, 9:06 AM

## 2015-09-25 NOTE — Care Management Note (Signed)
Case Management Note  Patient Details  Name: Lisa [name] MRN: [id number] Date of Birth: 02-13-1935  Subjective/Objective: d/c home w/HHRN/HHPT-rep Santiago Glad w/[facility] aware,HHC already ordered.                   Action/Plan:d/c home w/HHC.   Expected Discharge Date:   (unknown)               Expected Discharge Plan:  [place]  In-House Referral:  Interpreting Services (speaks [place])  Discharge planning Services  CM Consult  Post Acute Care Choice:  Home Health (Active w/[facility] HHRN/PT) Choice offered to:  Patient  DME Arranged:    DME Agency:     HH Arranged:  RN, PT [place] Agency:  [place]  Status of Service:  Completed, signed off  If discussed at [place] of Stay Meetings, dates discussed:    Additional Comments:  Dessa Phi, RN 09/25/2015, 2:36 PM

## 2015-09-25 NOTE — Progress Notes (Signed)
Called Wynona Canes to review discharge process with patient. Family called husband to pick up patient. Pt prepared for discharged, instructions reviewed. Family acknowledged understanding and will explain to patient again. SRP, RN

## 2015-09-25 NOTE — Progress Notes (Signed)
80 y.o.female w/ PMH of chronic combined systolic and diastolic CHF (EF [id number] by echo in 08/2014), persistent atrial fibrillation (on Xarelto), mild AS, OSA (on CPAP), CKD, and Type 2 DM who presented to [facility] ED on 09/17/2015 for abdominal pain and nausea. Cards consulted for CHF exacerbation   Subjective: Complains of headache and when stands she feels she may pass out.  No chest pain and no SOB  No phone interpreter available now.    Objective: Vital signs in last 24 hours: Temp:  [97 F (36.1 C)-98.1 F (36.7 C)] 97 F (36.1 C) (08/22 0618) Pulse Rate:  [70-82] 82 (08/22 0618) Resp:  [18-20] 18 (08/22 0618) BP: (115-126)/(53-57) 126/53 (08/22 0618) SpO2:  [98 %-100 %] 99 % (08/22 0618) Weight:  [162 lb (73.5 kg)] 162 lb (73.5 kg) (08/21 2106) Weight change: 8 oz (0.227 kg) Last BM Date: 09/23/15 Intake/Output from previous day: -78 08/21 0701 - 08/22 0700 In: 1323 [P.O.:1320; I.V.:3] Out: 1701 [Urine:1700; Stool:1] Intake/Output this shift: Total I/O In: 120 [P.O.:120] Out: 700 [Urine:700]  PE: General:Pleasant affect, NAD, though obviously anxious about Headache and feeling she may pass out.  Per RN she was fine until after coreg and lasix.   Skin:Warm and dry, brisk capillary refill HEENT:normocephalic, sclera clear, mucus membranes moist Neck:supple, no JVD  Heart:irreg irreg without murmur, gallup, rub or click Lungs:clear without rales, rhonchi, or wheezes [id number], non tender, + BS, do not palpate liver spleen or masses Ext:no lower ext edema,  2+ radial pulses Neuro:alert and oriented without interpreter, MAE, follows commands, + facial symmetry  Tele:  HR to 40s at times, chronic a fib   Lab Results:  Recent Labs  09/24/15 0543 09/25/15 0544  WBC 11.4* 10.6*  HGB 11.1* 11.2*  HCT 34.1* 34.7*  PLT 292 276   BMET  Recent Labs  09/24/15 0543 09/25/15 0544  NA 140 140  K 3.5 3.6  CL 105 108  CO2 26 24  GLUCOSE 175* 166*  BUN  67* 66*  CREATININE 1.89* 1.69*  CALCIUM 8.8* 9.1   No results for input(s): TROPONINI in the last 72 hours.  Invalid input(s): CK, MB  Lab Results  Component Value Date   CHOL 128 08/24/2014   HDL 30 (L) 08/24/2014   LDLCALC 83 08/24/2014   TRIG 77 08/24/2014   CHOLHDL 4.3 08/24/2014   Lab Results  Component Value Date   HGBA1C 8.3 (H) 09/18/2015     Lab Results  Component Value Date   TSH 2.057 08/23/2014     Studies/Results: No results found.  Medications: I have reviewed the patient's current medications. Scheduled Meds: . carvedilol  12.5 mg Oral BID WC  . docusate sodium  100 mg Oral Daily  . furosemide  40 mg Oral BID  . guaiFENesin  600 mg Oral BID  . hydrALAZINE  37.5 mg Oral Q8H  . insulin aspart  0-20 Units Subcutaneous TID WC  . insulin aspart  0-5 Units Subcutaneous QHS  . insulin aspart  8 Units Subcutaneous TID WC  . insulin glargine  45 Units Subcutaneous QHS  . isosorbide mononitrate  30 mg Oral Daily  . metoCLOPramide  10 mg Oral TID AC  . polyethylene glycol  17 g Oral Daily  . rivaroxaban  15 mg Oral Q lunch  . rosuvastatin  10 mg Oral Daily  . senna-docusate  1 tablet Oral BID  . sodium chloride flush  3 mL Intravenous Q12H   Continuous  Infusions:  PRN Meds:.sodium chloride, acetaminophen, butalbital-acetaminophen-caffeine, guaiFENesin, ondansetron (ZOFRAN) IV, sodium chloride flush, zolpidem  Assessment/Plan: Principal Problem:   Acute on chronic combined systolic and diastolic heart failure (HCC) Active Problems:   Essential hypertension   OSA on CPAP   Persistent atrial fibrillation (HCC)   Uncontrolled type 2 diabetes mellitus with circulatory disorder, with long-term current use of insulin (HCC)   Leukocytosis   Elevated troponin   Chest pain   Acute kidney injury ([place])   Sinus pause   Liver lesion  1. Acute on Chronic Combined Systolic and Diastolic CHF - echo in Q000111Q showed EF of 40-45%. Repeat echo this admission shows  EF of 30% to 35%. There is akinesis of the mid-apicalanteroseptal, anterolateral, inferoseptal, and apical myocardium. Reports having an MI at [facility] last month. Need to obtain these records.( they are part of [facility] system but do not find in care everywhere.  - BNP 1492 on admission and CXR showing central mild vascular congestion and mild perihilar interstitial prominence suspicious for mild interstitial edema. Small right pleural effusion with right basilar atelectasis or infiltrate.  - started on IV Lasix 60mg  BID with a recorded output of -7.8L thus far. Dose decreased to IV Lasix 40mg  BID with rising creatinine. Weight at 162lbs, down 7pounds since admission. Volume status improved on exam.  -Lasix changed to po , improved Cr today at 1.69 - continue ?Coreg 12.5mg  BID. Decrease to 6.25? Dr. Meda Coffee to see. ARB currently held with AKI. Dig level up to 1.6 (has been discontinued).  Continue Imdur 30mg  daily and Hydralazine 37.5mg  Q8H.   2. Chronic Atrial Fibrillation-pt did not wish to have DCCV and rate was controlled.  -was taking both Lopressor and Coreg PTA instead of Coreg alone. HR to 43 at times. Pauses up to 2.1 seconds noted on telemetry, likely secondary to taking multiple AV nodal blocking agents. No recurrent pauses > 2.0ssince. Continue Coreg for rate-control. - This patients CHA2DS2-VASc Score and unadjusted Ischemic Stroke Rate (% per year) is equal to 9.7 % stroke rate/year from a score of 6(CHF, HTN, DM, Female, Age (2)). On Xarelto PTA, held due to patient reporting dark tarry stools. Occult blood negative and Hgb stable. Xarelto resumed at 15mg  daily (adjusted dose for renal dysfunction).  3. Elevated Troponin - Cyclic troponin values have been 0.12, 0.17, 0.15, and 0.14. Flat trend, likely secondary to CHF exacerbation and AKI.  - reported having an MI earlier this year at [facility]. No records available for review. [place] is part of [facility] system but no  record noted.   4. AKI - creatinine 1.89 today (was 0.99 two months ago).   5. Abdominal Pain - Abdominal US with no acute or focal lesion to explain the patient's abdominal pain but anincidental finding of a3.3 cm hyperechoic lesion at the inferior aspect of the right lobe of the liver was noted, likely representinga hemangioma. F/U CT or MRI recommended. - per admitting team  6. Hypokalemia - resolved, K+ 3.5 yesterday. Today  3.6  7. NSVT - no recurrence on telemetry.   8. Near syncope today, would decrease BB with stable BP no orthostatic hypotension but decreased HR at times.  9. Head ache per IM   LOS: 8 days   Time spent with pt. :15 minutes. Cecilie Kicks  Nurse Practitioner Certified Pager [phone] or after 5pm and on weekends call [phone] 09/25/2015, 11:05 AM   The patient was seen, examined and discussed with Cecilie Kicks, NP and  I agree with the above.   80 y.o.female w/ PMH of chronic combined systolic and diastolic CHF (EF A999333 by echo in 08/2014), persistent atrial fibrillation (on Xarelto), mild AS, OSA (on CPAP), CKD, and Type 2 DM who presented to [facility] ED on 09/17/2015 for abdominal pain and nausea. Cards consulted for CHF exacerbation. She diuresed total of 7.6 L, now 161 lbs, 176 lbs in May 2017, she doesn't appear to be fluid overloaded, we switched to oral lasix 40 mg po BID yesterday and is tolerating it wall. Crea has improved, weight is stable.  She is complaining of headache, I would discontinue imdur.  We will sign off, call us with any questions.  We will arrange for an outpatient follow up.  Ena Dawley 09/25/2015

## 2015-10-07 NOTE — Discharge Summary (Signed)
[facility] Discharge Summary   Patient: Lisa [name] [id number]   PCP: Colonel Bald, MD DOB: 1935-07-27   Date of admission: 09/17/2015   Date of discharge: 09/25/2015    Discharge Diagnoses:  Principal Problem:   Acute on chronic combined systolic and diastolic heart failure (HCC) Active Problems:   Essential hypertension   OSA on CPAP   Persistent atrial fibrillation ([place])   Uncontrolled type 2 diabetes mellitus with circulatory disorder, with long-term current use of insulin (HCC)   Leukocytosis   Elevated troponin   Chest pain   Acute kidney injury ([place])   Sinus pause   Liver lesion  Admitted From: home Disposition:  Home with home health  Recommendations for Outpatient Follow-up:  1. Follow up with PCP in 1 week to adjust insulin and BMP   Follow-up Information    Paruchuri, Saunders Glance, MD. Call in 1 week(s).   Specialty:  Internal Medicine Why:  with BMP.  and to adjust insulin.  Contact information: [place] [address] [phone]        Skeet Latch, MD Follow up on 10/11/2015.   Specialty:  Cardiology Why:  at 1:30 PM with Kerin Ransom, PA with Dr. Molli Posey information: [place] [address] [place] .   Why:  [place] Contact information: [address] [place] [address] [phone]          Diet recommendation: cardiac and diabetic diet  Activity: The patient is advised to gradually reintroduce usual activities.  Discharge Condition: good  Code Status: full code  History of present illness: As per the H and P dictated on admission, "Lisa [name] is a 80 y.o. female with medical history significant of HTN, CKD, Chronic combined CHF,  A. Fib on AC,  heart murmur, OSA with CPAP, DM  Presented with abdominal pain   for the past monthoccasionally radiating to her chest and  lightheadedness not associated constipation or diarrhea she has been endorsing orthopnea and lower extremity edema she is worried that she is retaining fluid in her abdomen she had attempted to use pain medications for this it's also been associated with dark stools she endorses some nausea and vomiting. She was seen today by her cardiologist. Her weight was not ataxic come down from 177 pounds in June down to 173 pounds today. She has not been taking her medications correctly instead of switching from metoprolol to carve EDO she's been taking both the metoprol and Coreg but only taking Coreg once day. According to Cardiologist notes she appears to be euvolemic on exam earlier today.  Recently she have had episodes of hypoglycemia were to be secondary to taking NovoLog in large doses without relationship to food hemoglobin A1c recently was up to 12.2 Her blood sugars recently has been very elevated she is scheduled to see endocrinologist next week.  2 hours after seeing cardiology she presented to [facility] long emergency department and plain and abdominal pain"  Hospital Course:  Summary of her active problems in the hospital is as following. 1. Acute on chronic combined systolic and diastolic heart failure ([place]) Patient takes 40 mg of oral Lasix 3 times a day per medication report. On admission, she was placed on 40 mg of IV Lasix twice a day. No change in weight or significant diuresis noted, although the patient does say she feels a bit better.  Troponin elevation likely secondary to demand ischemia. A 2-D echo was repeated which showed progression of aortic stenosis. Highly appreciated cardiology input in assessment and management.  Active Problems: Essential hypertension Continue Coreg. Stop diovan and digoxin  Acute kidney injury in the setting of CKD (chronic kidney disease) stage 3, GFR 15-29 ml/min (HCC) resolved  Baseline creatinine around 1.  Admission creatinine elevated over  usual baseline values, suspect from decreased cardiac output due to heart failure exacerbation. Monitor closely with diuresis.  OSA on CPAP Continue CPAP daily at bedtime.  Persistent atrial fibrillation (HCC) /pauses This patients CHA2DS2-VASc Score and unadjusted Ischemic Stroke Rate (% per year) is equal to 9.7 % stroke rate/year from a score of 6(CHF, HTN, DM, Female, Age (2)Heart rate controlled on Coreg and digoxin. On Xarelto chronically,  Uncontrolled type 2 diabetes mellitus with circulatory disorder, with long-term current use of insulin ([place]) Currently being managed with Lantus and sliding scale insulin. Increasing Lantus to 40 units.  Liver lesion Follow-up recommended as an outpatient.  Leukocytosis No evidence of infection. Monitor  Chest pain. Probably noncardiac or associated with CHF. Troponins are unremarkable, EKG also unremarkable.  Constipation. Currently resolved.  All other chronic medical condition were stable during the hospitalization.  Patient was seen by physical therapy, who recommended home health, which was arranged by Education officer, museum and case Freight forwarder. On the day of the discharge the patient's vitals were stable, and no other acute medical condition were reported by patient. the patient was felt safe to be discharge at home with home health.  Procedures and Results:  none   Consultations:  cardiology  DISCHARGE MEDICATION: Discharge Medication List as of 09/25/2015  2:32 PM    START taking these medications   Details  butalbital-acetaminophen-caffeine (FIORICET, ESGIC) 50-325-40 MG tablet Take 1 tablet by mouth every 6 (six) hours as needed for headache., Starting Tue 09/25/2015, Print    docusate sodium (COLACE) 100 MG capsule Take 1 capsule (100 mg total) by mouth daily., Starting Tue 09/25/2015, Normal    guaiFENesin (MUCINEX) 600 MG 12 hr tablet Take 1 tablet (600 mg total) by mouth 2 (two) times daily., Starting Tue  09/25/2015, Normal    hydrALAZINE (APRESOLINE) 25 MG tablet Take 1.5 tablets (37.5 mg total) by mouth every 8 (eight) hours., Starting Tue 09/25/2015, Normal    insulin glargine (LANTUS) 100 UNIT/ML injection Inject 0.4 mLs (40 Units total) into the skin at bedtime., Starting Tue 09/25/2015, Normal    polyethylene glycol (MIRALAX / GLYCOLAX) packet Take 17 g by mouth daily., Starting Tue 09/25/2015, Normal      CONTINUE these medications which have CHANGED   Details  carvedilol (COREG) 12.5 MG tablet Take 1 tablet (12.5 mg total) by mouth 2 (two) times daily with a meal., Starting Tue 09/25/2015, Normal    furosemide (LASIX) 40 MG tablet Take 1 tablet (40 mg total) by mouth 2 (two) times daily., Starting Tue 09/25/2015, Normal    rivaroxaban (XARELTO) 15 MG TABS tablet Take 1 tablet (15 mg total) by mouth daily with supper., Starting Tue 09/25/2015, Normal      CONTINUE these medications which have NOT CHANGED   Details  insulin aspart (NOVOLOG FLEXPEN) 100 UNIT/ML FlexPen Inject 10-15 Units into the skin 3 (three) times daily with meals. Pt uses per sliding scale., Historical Med    metFORMIN (GLUCOPHAGE) 500 MG tablet Take 1 tablet (500 mg total) by mouth 2 (two) times daily with a meal., Starting Thu 08/02/2015, Normal    rosuvastatin (CRESTOR)  10 MG tablet Take 1 tablet (10 mg total) by mouth daily., Starting Thu 09/28/2014, Normal      STOP taking these medications     digoxin (LANOXIN) 0.125 MG tablet      insulin glargine (LANTUS) 100 unit/mL SOPN      valsartan (DIOVAN) 320 MG tablet        Allergies  Allergen Reactions  . Lisinopril Cough   Discharge Instructions    Diet - low sodium heart healthy    Complete by:  As directed   Diet Carb Modified    Complete by:  As directed   Discharge instructions    Complete by:  As directed   For Heart failure patients -  Check your Weight same time everyday If you gain over 3 pounds, or you develop in leg swelling, experience more  shortness of breath or chest pain, call your Primary MD immediately.  Avoid adding extra salt in the food, food high in salt and Follow 1.5 lit/day fluid restriction.   Increase activity slowly    Complete by:  As directed     Discharge Exam: Filed Weights   09/23/15 0421 09/24/15 0614 09/24/15 2106  Weight: 72.9 kg (160 lb 11.5 oz) 73.3 kg (161 lb 8 oz) 73.5 kg (162 lb)   Vitals:   09/25/15 0618 09/25/15 1417  BP: (!) 126/53 (!) 124/56  Pulse: 82 69  Resp: 18 20  Temp: 97 F (36.1 C) 97.7 F (36.5 C)   General: Appear in no distress, no Rash; Oral Mucosa moist. Cardiovascular: S1 and S2 Present, no Murmur, no JVD Respiratory: Bilateral Air entry present and Clear to Auscultation, no Crackles, no wheezes Abdomen: Bowel Sound present, Soft and no tenderness Extremities: no Pedal edema, no calf tenderness Neurology: Grossly no focal neuro deficit.  The results of significant diagnostics from this hospitalization (including imaging, microbiology, ancillary and laboratory) are listed below for reference.    Significant Diagnostic Studies: Dg Chest Port 1 View  Result Date: 09/20/2015 CLINICAL DATA:  CHF exacerbation. EXAM: PORTABLE CHEST 1 VIEW COMPARISON:  09/17/2015. FINDINGS: Stable enlarged cardiac silhouette. Decreased prominence of the pulmonary vasculature. Increased prominence of the interstitial markings. Bilateral pleural effusions and bibasilar airspace opacity. Thoracic spine degenerative changes. Diffuse osteopenia. Aortic calcifications. IMPRESSION: 1. Progressive changes of congestive heart failure with the exception of less pulmonary vascular congestion. 2. Bibasilar atelectasis. 3. Aortic atherosclerosis. Electronically Signed   By: Claudie Revering M.D.   On: 09/20/2015 13:39   Dg Abdomen Acute W/chest  Result Date: 09/17/2015 CLINICAL DATA:  Abdominal pain, chest pain EXAM: DG ABDOMEN ACUTE W/ 1V CHEST COMPARISON:  None. FINDINGS: Central vascular congestion and mild  perihilar interstitial prominence suspicious for mild interstitial edema. Small right pleural effusion with right basilar atelectasis or infiltrate. There is normal small bowel gas pattern. No evidence of free abdominal air. IMPRESSION: Central mild vascular congestion and mild perihilar interstitial prominence suspicious for mild interstitial edema. Small right pleural effusion with right basilar atelectasis or infiltrate. Normal small bowel gas pattern. Electronically Signed   By: Lahoma Crocker M.D.   On: 09/17/2015 14:54   Ct Renal Stone Study  Result Date: 09/17/2015 CLINICAL DATA:  Left lower abdominal pain and nausea. Congestive heart failure and chronic kidney disease stage 3. EXAM: CT ABDOMEN AND PELVIS WITHOUT CONTRAST TECHNIQUE: Multidetector CT imaging of the abdomen and pelvis was performed following the standard protocol without IV contrast. COMPARISON:  None. FINDINGS: Lower chest: Small bilateral pleural effusions and bibasilar atelectasis.  Bibasilar interstitial prominence suspicious for pulmonary edema. Moderate cardiomegaly and small pericardial effusion. Aortic atherosclerosis and coronary artery calcification. Hepatobiliary: No mass visualized on this un-enhanced exam. Pancreas: No mass or inflammatory process identified on this un-enhanced exam. Spleen: Within normal limits in size. Adrenals/Urinary Tract: No evidence of urolithiasis or hydronephrosis. Unopacified urinary bladder is unremarkable in appearance. Stomach/Bowel: No evidence of obstruction, inflammatory process, or abnormal fluid collections. Normal appendix visualized. Vascular/Lymphatic: No pathologically enlarged lymph nodes. No evidence of abdominal aortic aneurysm. Aortic atherosclerosis noted. Reproductive: No mass or other significant abnormality. Other: None. Musculoskeletal:  No suspicious bone lesions identified. IMPRESSION: No acute findings within the abdomen or pelvis. Cardiomegaly, bibasilar pulmonary interstitial  prominence and small bilateral pleural effusions, suspicious for congestive heart failure. Small pericardial effusion. Aortic atherosclerosis and coronary artery calcification. Electronically Signed   By: Earle Gell M.D.   On: 09/17/2015 15:52   US Abdomen Limited Ruq  Result Date: 09/17/2015 CLINICAL DATA:  Epigastric abdominal pain. EXAM: US ABDOMEN LIMITED - RIGHT UPPER QUADRANT COMPARISON:  CT abdomen and pelvis 09/17/2015. Report of CT abdomen and pelvis with contrast 07/14/2008 and MRI abdomen 08/06/2007 FINDINGS: Gallbladder: Surgically absence Common bile duct: Diameter: Within normal limits following cholecystectomy and 11 mm. No obstructing mass lesion is present. Liver: A focal hyperechoic lesion is present at the inferior aspect of the right lobe of the liver measuring 3.3 x 1.9 x 2.9 cm. No other focal lesions are present. A right pleural effusion is noted. IMPRESSION: 1. No acute or focal lesion to explain the patient's abdominal pain. 2. **An incidental finding of potential clinical significance has been found. 3.3 cm hyperechoic lesion at the inferior aspect of the right lobe of the liver was not previously reported. This is nonspecific and most likely represents a hemangioma. This lesion was not discretely seen on the noncontrast CT of the abdomen and pelvis. It was not described on previous CT or MRI from 2010 and 2009. Follow-up CT of the abdomen only or MRI without and with contrast is recommended for further evaluation.** Electronically Signed   By: San Morelle M.D.   On: 09/17/2015 18:19    Microbiology: No results found for this or any previous visit (from the past 240 hour(s)).   Labs: CBC: No results for input(s): WBC, NEUTROABS, HGB, HCT, MCV, PLT in the last 168 hours. Basic Metabolic Panel: No results for input(s): NA, K, CL, CO2, GLUCOSE, BUN, CREATININE, CALCIUM, MG, PHOS in the last 168 hours. Liver Function Tests: No results for input(s): AST, ALT, ALKPHOS,  BILITOT, PROT, ALBUMIN in the last 168 hours. No results for input(s): LIPASE, AMYLASE in the last 168 hours. No results for input(s): AMMONIA in the last 168 hours. Cardiac Enzymes: No results for input(s): CKTOTAL, CKMB, CKMBINDEX, TROPONINI in the last 168 hours. BNP (last 3 results)  Recent Labs  09/19/15 0547 09/20/15 0550 09/22/15 0553  BNP 1,562.3* 1,971.9* 991.2*   CBG: No results for input(s): GLUCAP in the last 168 hours. Time spent: 30 minutes  Signed:  Berle Mull  [facility] 09/25/2015 , 3:58 PM

## 2015-10-11 ENCOUNTER — Ambulatory Visit: Payer: Medicare Other | Admitting: Cardiology

## 2015-10-24 ENCOUNTER — Other Ambulatory Visit (HOSPITAL_COMMUNITY): Payer: Self-pay

## 2015-10-24 ENCOUNTER — Inpatient Hospital Stay (HOSPITAL_COMMUNITY)
Admission: RE | Admit: 2015-10-24 | Discharge: 2015-11-14 | Disposition: A | Discharge status: Skilled Nursing Facility | Payer: Medicare Other | Source: Ambulatory Visit | Attending: Pulmonary Disease | Admitting: Pulmonary Disease

## 2015-10-24 DIAGNOSIS — K769 Liver disease, unspecified: Secondary | ICD-10-CM | POA: Diagnosis present

## 2015-10-24 DIAGNOSIS — J189 Pneumonia, unspecified organism: Secondary | ICD-10-CM

## 2015-10-24 DIAGNOSIS — Q231 Congenital insufficiency of aortic valve: Secondary | ICD-10-CM

## 2015-10-24 DIAGNOSIS — J939 Pneumothorax, unspecified: Secondary | ICD-10-CM

## 2015-10-24 DIAGNOSIS — G4733 Obstructive sleep apnea (adult) (pediatric): Secondary | ICD-10-CM

## 2015-10-24 DIAGNOSIS — Q230 Congenital stenosis of aortic valve: Secondary | ICD-10-CM

## 2015-10-24 DIAGNOSIS — J9600 Acute respiratory failure, unspecified whether with hypoxia or hypercapnia: Secondary | ICD-10-CM

## 2015-10-24 DIAGNOSIS — K5641 Fecal impaction: Secondary | ICD-10-CM

## 2015-10-24 DIAGNOSIS — Z9689 Presence of other specified functional implants: Secondary | ICD-10-CM

## 2015-10-24 DIAGNOSIS — Z794 Long term (current) use of insulin: Secondary | ICD-10-CM

## 2015-10-24 DIAGNOSIS — I350 Nonrheumatic aortic (valve) stenosis: Secondary | ICD-10-CM

## 2015-10-24 DIAGNOSIS — I10 Essential (primary) hypertension: Secondary | ICD-10-CM | POA: Diagnosis present

## 2015-10-24 DIAGNOSIS — N183 Chronic kidney disease, stage 3 unspecified: Secondary | ICD-10-CM | POA: Diagnosis present

## 2015-10-24 DIAGNOSIS — I314 Cardiac tamponade: Secondary | ICD-10-CM

## 2015-10-24 DIAGNOSIS — J9621 Acute and chronic respiratory failure with hypoxia: Secondary | ICD-10-CM

## 2015-10-24 DIAGNOSIS — E1165 Type 2 diabetes mellitus with hyperglycemia: Secondary | ICD-10-CM

## 2015-10-24 DIAGNOSIS — I313 Pericardial effusion (noninflammatory): Secondary | ICD-10-CM

## 2015-10-24 DIAGNOSIS — IMO0002 Reserved for concepts with insufficient information to code with codable children: Secondary | ICD-10-CM

## 2015-10-24 DIAGNOSIS — I4891 Unspecified atrial fibrillation: Secondary | ICD-10-CM | POA: Diagnosis present

## 2015-10-24 DIAGNOSIS — I5043 Acute on chronic combined systolic (congestive) and diastolic (congestive) heart failure: Secondary | ICD-10-CM | POA: Diagnosis present

## 2015-10-24 DIAGNOSIS — E1159 Type 2 diabetes mellitus with other circulatory complications: Secondary | ICD-10-CM

## 2015-10-24 DIAGNOSIS — J90 Pleural effusion, not elsewhere classified: Secondary | ICD-10-CM

## 2015-10-24 DIAGNOSIS — I509 Heart failure, unspecified: Secondary | ICD-10-CM

## 2015-10-24 DIAGNOSIS — Z4682 Encounter for fitting and adjustment of non-vascular catheter: Secondary | ICD-10-CM

## 2015-10-24 DIAGNOSIS — I3139 Other pericardial effusion (noninflammatory): Secondary | ICD-10-CM

## 2015-10-24 DIAGNOSIS — Z9989 Dependence on other enabling machines and devices: Secondary | ICD-10-CM

## 2015-10-24 DIAGNOSIS — J9690 Respiratory failure, unspecified, unspecified whether with hypoxia or hypercapnia: Secondary | ICD-10-CM

## 2015-10-25 LAB — URINALYSIS, ROUTINE W REFLEX MICROSCOPIC
BILIRUBIN URINE: NEGATIVE
Glucose, UA: NEGATIVE mg/dL
KETONES UR: NEGATIVE mg/dL
NITRITE: NEGATIVE
PROTEIN: 30 mg/dL — AB
Specific Gravity, Urine: 1.012 (ref 1.005–1.030)
pH: 5 (ref 5.0–8.0)

## 2015-10-25 LAB — COMPREHENSIVE METABOLIC PANEL
ALBUMIN: 2 g/dL — AB (ref 3.5–5.0)
ALT: 29 U/L (ref 14–54)
ANION GAP: 9 (ref 5–15)
AST: 18 U/L (ref 15–41)
Alkaline Phosphatase: 81 U/L (ref 38–126)
BUN: 39 mg/dL — AB (ref 6–20)
CALCIUM: 8.4 mg/dL — AB (ref 8.9–10.3)
CO2: 26 mmol/L (ref 22–32)
Chloride: 99 mmol/L — ABNORMAL LOW (ref 101–111)
Creatinine, Ser: 1.81 mg/dL — ABNORMAL HIGH (ref 0.44–1.00)
GFR calc Af Amer: 30 mL/min — ABNORMAL LOW (ref >60)
GFR calc non Af Amer: 26 mL/min — ABNORMAL LOW (ref >60)
GLUCOSE: 237 mg/dL — AB (ref 65–99)
Potassium: 4.4 mmol/L (ref 3.5–5.1)
SODIUM: 134 mmol/L — AB (ref 135–145)
Total Bilirubin: 0.9 mg/dL (ref 0.3–1.2)
Total Protein: 5.5 g/dL — ABNORMAL LOW (ref 6.5–8.1)

## 2015-10-25 LAB — CBC WITH DIFFERENTIAL/PLATELET
BASOS ABS: 0.1 10*3/uL (ref 0.0–0.1)
Basophils Relative: 1 %
Eosinophils Absolute: 0.5 10*3/uL (ref 0.0–0.7)
Eosinophils Relative: 4 %
HEMATOCRIT: 34.6 % — AB (ref 36.0–46.0)
Hemoglobin: 10.4 g/dL — ABNORMAL LOW (ref 12.0–15.0)
LYMPHS ABS: 1.7 10*3/uL (ref 0.7–4.0)
LYMPHS PCT: 15 %
MCH: 25.6 pg — ABNORMAL LOW (ref 26.0–34.0)
MCHC: 30.1 g/dL (ref 30.0–36.0)
MCV: 85 fL (ref 78.0–100.0)
MONO ABS: 0.7 10*3/uL (ref 0.1–1.0)
Monocytes Relative: 6 %
NEUTROS ABS: 8.4 10*3/uL — AB (ref 1.7–7.7)
Neutrophils Relative %: 74 %
Platelets: 162 10*3/uL (ref 150–400)
RBC: 4.07 MIL/uL (ref 3.87–5.11)
RDW: 17.3 % — ABNORMAL HIGH (ref 11.5–15.5)
WBC: 11.4 10*3/uL — AB (ref 4.0–10.5)

## 2015-10-25 LAB — URINE MICROSCOPIC-ADD ON

## 2015-10-25 LAB — MAGNESIUM: Magnesium: 1.5 mg/dL — ABNORMAL LOW (ref 1.7–2.4)

## 2015-10-25 LAB — PROTIME-INR
INR: 1.31
PROTHROMBIN TIME: 16.3 s — AB (ref 11.4–15.2)

## 2015-10-25 LAB — BASIC METABOLIC PANEL
Anion gap: 8 (ref 5–15)
BUN: 39 mg/dL — ABNORMAL HIGH (ref 6–20)
CALCIUM: 8.4 mg/dL — AB (ref 8.9–10.3)
CO2: 27 mmol/L (ref 22–32)
CREATININE: 1.82 mg/dL — AB (ref 0.44–1.00)
Chloride: 99 mmol/L — ABNORMAL LOW (ref 101–111)
GFR calc non Af Amer: 26 mL/min — ABNORMAL LOW (ref >60)
GFR, EST AFRICAN AMERICAN: 30 mL/min — AB (ref >60)
Glucose, Bld: 237 mg/dL — ABNORMAL HIGH (ref 65–99)
Potassium: 4.4 mmol/L (ref 3.5–5.1)
SODIUM: 134 mmol/L — AB (ref 135–145)

## 2015-10-25 LAB — PROCALCITONIN: PROCALCITONIN: 0.18 ng/mL

## 2015-10-25 LAB — TSH: TSH: 3.019 u[IU]/mL (ref 0.350–4.500)

## 2015-10-25 LAB — PHOSPHORUS: Phosphorus: 3.7 mg/dL (ref 2.5–4.6)

## 2015-10-26 LAB — CBC WITH DIFFERENTIAL/PLATELET
BASOS ABS: 0.1 10*3/uL (ref 0.0–0.1)
BASOS PCT: 0 %
EOS ABS: 0.4 10*3/uL (ref 0.0–0.7)
Eosinophils Relative: 3 %
HCT: 34.7 % — ABNORMAL LOW (ref 36.0–46.0)
HEMOGLOBIN: 10.4 g/dL — AB (ref 12.0–15.0)
Lymphocytes Relative: 15 %
Lymphs Abs: 1.8 10*3/uL (ref 0.7–4.0)
MCH: 25.1 pg — ABNORMAL LOW (ref 26.0–34.0)
MCHC: 30 g/dL (ref 30.0–36.0)
MCV: 83.8 fL (ref 78.0–100.0)
Monocytes Absolute: 0.8 10*3/uL (ref 0.1–1.0)
Monocytes Relative: 6 %
NEUTROS PCT: 76 %
Neutro Abs: 8.7 10*3/uL — ABNORMAL HIGH (ref 1.7–7.7)
PLATELETS: 155 10*3/uL (ref 150–400)
RBC: 4.14 MIL/uL (ref 3.87–5.11)
RDW: 17.2 % — ABNORMAL HIGH (ref 11.5–15.5)
WBC: 11.7 10*3/uL — AB (ref 4.0–10.5)

## 2015-10-26 LAB — RENAL FUNCTION PANEL
Albumin: 2 g/dL — ABNORMAL LOW (ref 3.5–5.0)
Anion gap: 11 (ref 5–15)
BUN: 39 mg/dL — AB (ref 6–20)
CHLORIDE: 96 mmol/L — AB (ref 101–111)
CO2: 25 mmol/L (ref 22–32)
CREATININE: 1.71 mg/dL — AB (ref 0.44–1.00)
Calcium: 8.3 mg/dL — ABNORMAL LOW (ref 8.9–10.3)
GFR calc Af Amer: 32 mL/min — ABNORMAL LOW (ref >60)
GFR, EST NON AFRICAN AMERICAN: 27 mL/min — AB (ref >60)
Glucose, Bld: 279 mg/dL — ABNORMAL HIGH (ref 65–99)
POTASSIUM: 4 mmol/L (ref 3.5–5.1)
Phosphorus: 3.1 mg/dL (ref 2.5–4.6)
Sodium: 132 mmol/L — ABNORMAL LOW (ref 135–145)

## 2015-10-26 LAB — HEMOGLOBIN A1C
Hgb A1c MFr Bld: 7.1 % — ABNORMAL HIGH (ref 4.8–5.6)
MEAN PLASMA GLUCOSE: 157 mg/dL

## 2015-10-26 LAB — URINE CULTURE: CULTURE: NO GROWTH

## 2015-10-26 LAB — BRAIN NATRIURETIC PEPTIDE: B NATRIURETIC PEPTIDE 5: 2092.8 pg/mL — AB (ref 0.0–100.0)

## 2015-10-26 LAB — MAGNESIUM: MAGNESIUM: 1.5 mg/dL — AB (ref 1.7–2.4)

## 2015-10-27 ENCOUNTER — Other Ambulatory Visit (HOSPITAL_COMMUNITY): Payer: Self-pay

## 2015-10-27 LAB — RENAL FUNCTION PANEL
ANION GAP: 9 (ref 5–15)
Albumin: 1.9 g/dL — ABNORMAL LOW (ref 3.5–5.0)
BUN: 42 mg/dL — AB (ref 6–20)
CHLORIDE: 98 mmol/L — AB (ref 101–111)
CO2: 32 mmol/L (ref 22–32)
Calcium: 8.7 mg/dL — ABNORMAL LOW (ref 8.9–10.3)
Creatinine, Ser: 1.61 mg/dL — ABNORMAL HIGH (ref 0.44–1.00)
GFR calc Af Amer: 34 mL/min — ABNORMAL LOW (ref >60)
GFR, EST NON AFRICAN AMERICAN: 30 mL/min — AB (ref >60)
GLUCOSE: 236 mg/dL — AB (ref 65–99)
PHOSPHORUS: 3.3 mg/dL (ref 2.5–4.6)
POTASSIUM: 4.1 mmol/L (ref 3.5–5.1)
Sodium: 139 mmol/L (ref 135–145)

## 2015-10-27 LAB — CBC WITH DIFFERENTIAL/PLATELET
BASOS ABS: 0.1 10*3/uL (ref 0.0–0.1)
BASOS PCT: 1 %
Eosinophils Absolute: 0.4 10*3/uL (ref 0.0–0.7)
Eosinophils Relative: 5 %
HEMATOCRIT: 34.2 % — AB (ref 36.0–46.0)
HEMOGLOBIN: 10.1 g/dL — AB (ref 12.0–15.0)
LYMPHS PCT: 15 %
Lymphs Abs: 1.5 10*3/uL (ref 0.7–4.0)
MCH: 25.3 pg — ABNORMAL LOW (ref 26.0–34.0)
MCHC: 29.5 g/dL — ABNORMAL LOW (ref 30.0–36.0)
MCV: 85.5 fL (ref 78.0–100.0)
MONO ABS: 0.7 10*3/uL (ref 0.1–1.0)
MONOS PCT: 7 %
NEUTROS PCT: 73 %
Neutro Abs: 7.1 10*3/uL (ref 1.7–7.7)
Platelets: 117 10*3/uL — ABNORMAL LOW (ref 150–400)
RBC: 4 MIL/uL (ref 3.87–5.11)
RDW: 17.3 % — ABNORMAL HIGH (ref 11.5–15.5)
WBC: 9.7 10*3/uL (ref 4.0–10.5)

## 2015-10-27 LAB — MAGNESIUM: MAGNESIUM: 1.7 mg/dL (ref 1.7–2.4)

## 2015-10-29 ENCOUNTER — Other Ambulatory Visit (HOSPITAL_COMMUNITY): Payer: Self-pay

## 2015-10-29 LAB — CBC WITH DIFFERENTIAL/PLATELET
BASOS ABS: 0.1 10*3/uL (ref 0.0–0.1)
Basophils Relative: 1 %
EOS PCT: 4 %
Eosinophils Absolute: 0.4 10*3/uL (ref 0.0–0.7)
HCT: 32.6 % — ABNORMAL LOW (ref 36.0–46.0)
Hemoglobin: 9.7 g/dL — ABNORMAL LOW (ref 12.0–15.0)
LYMPHS PCT: 22 %
Lymphs Abs: 1.9 10*3/uL (ref 0.7–4.0)
MCH: 25.3 pg — ABNORMAL LOW (ref 26.0–34.0)
MCHC: 29.8 g/dL — ABNORMAL LOW (ref 30.0–36.0)
MCV: 85.1 fL (ref 78.0–100.0)
MONO ABS: 1.1 10*3/uL — AB (ref 0.1–1.0)
MONOS PCT: 13 %
Neutro Abs: 5.3 10*3/uL (ref 1.7–7.7)
Neutrophils Relative %: 61 %
PLATELETS: 70 10*3/uL — AB (ref 150–400)
RBC: 3.83 MIL/uL — ABNORMAL LOW (ref 3.87–5.11)
RDW: 17.4 % — AB (ref 11.5–15.5)
WBC: 8.6 10*3/uL (ref 4.0–10.5)

## 2015-10-29 LAB — BASIC METABOLIC PANEL
ANION GAP: 5 (ref 5–15)
BUN: 43 mg/dL — ABNORMAL HIGH (ref 6–20)
CALCIUM: 8.4 mg/dL — AB (ref 8.9–10.3)
CO2: 37 mmol/L — ABNORMAL HIGH (ref 22–32)
CREATININE: 1.37 mg/dL — AB (ref 0.44–1.00)
Chloride: 95 mmol/L — ABNORMAL LOW (ref 101–111)
GFR, EST AFRICAN AMERICAN: 42 mL/min — AB (ref >60)
GFR, EST NON AFRICAN AMERICAN: 36 mL/min — AB (ref >60)
Glucose, Bld: 165 mg/dL — ABNORMAL HIGH (ref 65–99)
Potassium: 3.4 mmol/L — ABNORMAL LOW (ref 3.5–5.1)
Sodium: 137 mmol/L (ref 135–145)

## 2015-10-29 LAB — BRAIN NATRIURETIC PEPTIDE: B NATRIURETIC PEPTIDE 5: 1884 pg/mL — AB (ref 0.0–100.0)

## 2015-10-29 LAB — PHOSPHORUS: PHOSPHORUS: 2.4 mg/dL — AB (ref 2.5–4.6)

## 2015-10-29 LAB — MAGNESIUM: Magnesium: 1.5 mg/dL — ABNORMAL LOW (ref 1.7–2.4)

## 2015-10-30 ENCOUNTER — Other Ambulatory Visit (HOSPITAL_COMMUNITY): Payer: Self-pay

## 2015-10-30 LAB — BASIC METABOLIC PANEL
ANION GAP: 5 (ref 5–15)
BUN: 40 mg/dL — ABNORMAL HIGH (ref 6–20)
CHLORIDE: 92 mmol/L — AB (ref 101–111)
CO2: 42 mmol/L — AB (ref 22–32)
CREATININE: 1.24 mg/dL — AB (ref 0.44–1.00)
Calcium: 8.5 mg/dL — ABNORMAL LOW (ref 8.9–10.3)
GFR calc non Af Amer: 41 mL/min — ABNORMAL LOW (ref >60)
GFR, EST AFRICAN AMERICAN: 47 mL/min — AB (ref >60)
Glucose, Bld: 204 mg/dL — ABNORMAL HIGH (ref 65–99)
POTASSIUM: 3.2 mmol/L — AB (ref 3.5–5.1)
Sodium: 139 mmol/L (ref 135–145)

## 2015-10-31 ENCOUNTER — Other Ambulatory Visit (HOSPITAL_COMMUNITY): Payer: Self-pay

## 2015-10-31 LAB — RENAL FUNCTION PANEL
ALBUMIN: 2.1 g/dL — AB (ref 3.5–5.0)
Anion gap: 5 (ref 5–15)
BUN: 39 mg/dL — ABNORMAL HIGH (ref 6–20)
CHLORIDE: 92 mmol/L — AB (ref 101–111)
CO2: 44 mmol/L — ABNORMAL HIGH (ref 22–32)
CREATININE: 1.2 mg/dL — AB (ref 0.44–1.00)
Calcium: 8.4 mg/dL — ABNORMAL LOW (ref 8.9–10.3)
GFR calc Af Amer: 49 mL/min — ABNORMAL LOW (ref >60)
GFR, EST NON AFRICAN AMERICAN: 42 mL/min — AB (ref >60)
Glucose, Bld: 197 mg/dL — ABNORMAL HIGH (ref 65–99)
POTASSIUM: 3.3 mmol/L — AB (ref 3.5–5.1)
Phosphorus: 1.5 mg/dL — ABNORMAL LOW (ref 2.5–4.6)
Sodium: 141 mmol/L (ref 135–145)

## 2015-10-31 LAB — CBC WITH DIFFERENTIAL/PLATELET
BASOS ABS: 0.1 10*3/uL (ref 0.0–0.1)
Basophils Relative: 1 %
Eosinophils Absolute: 0.4 10*3/uL (ref 0.0–0.7)
Eosinophils Relative: 4 %
HEMATOCRIT: 32.2 % — AB (ref 36.0–46.0)
Hemoglobin: 9.1 g/dL — ABNORMAL LOW (ref 12.0–15.0)
LYMPHS PCT: 22 %
Lymphs Abs: 2.2 10*3/uL (ref 0.7–4.0)
MCH: 24.7 pg — ABNORMAL LOW (ref 26.0–34.0)
MCHC: 28.3 g/dL — ABNORMAL LOW (ref 30.0–36.0)
MCV: 87.3 fL (ref 78.0–100.0)
MONO ABS: 1.9 10*3/uL — AB (ref 0.1–1.0)
Monocytes Relative: 19 %
NEUTROS ABS: 5.5 10*3/uL (ref 1.7–7.7)
Neutrophils Relative %: 55 %
Platelets: 43 10*3/uL — ABNORMAL LOW (ref 150–400)
RBC: 3.69 MIL/uL — ABNORMAL LOW (ref 3.87–5.11)
RDW: 17.3 % — AB (ref 11.5–15.5)
WBC: 9.9 10*3/uL (ref 4.0–10.5)

## 2015-10-31 LAB — MAGNESIUM: Magnesium: 1.4 mg/dL — ABNORMAL LOW (ref 1.7–2.4)

## 2015-11-01 LAB — COMPREHENSIVE METABOLIC PANEL
ALK PHOS: 148 U/L — AB (ref 38–126)
ALT: 22 U/L (ref 14–54)
ANION GAP: 5 (ref 5–15)
AST: 23 U/L (ref 15–41)
Albumin: 2.3 g/dL — ABNORMAL LOW (ref 3.5–5.0)
BILIRUBIN TOTAL: 0.8 mg/dL (ref 0.3–1.2)
BUN: 36 mg/dL — ABNORMAL HIGH (ref 6–20)
CALCIUM: 8.6 mg/dL — AB (ref 8.9–10.3)
CO2: 43 mmol/L — ABNORMAL HIGH (ref 22–32)
Chloride: 90 mmol/L — ABNORMAL LOW (ref 101–111)
Creatinine, Ser: 1.18 mg/dL — ABNORMAL HIGH (ref 0.44–1.00)
GFR, EST AFRICAN AMERICAN: 50 mL/min — AB (ref >60)
GFR, EST NON AFRICAN AMERICAN: 43 mL/min — AB (ref >60)
GLUCOSE: 207 mg/dL — AB (ref 65–99)
Potassium: 4.6 mmol/L (ref 3.5–5.1)
Sodium: 138 mmol/L (ref 135–145)
TOTAL PROTEIN: 6 g/dL — AB (ref 6.5–8.1)

## 2015-11-01 LAB — CBC WITH DIFFERENTIAL/PLATELET
Basophils Absolute: 0.1 10*3/uL (ref 0.0–0.1)
Basophils Relative: 1 %
Eosinophils Absolute: 0.6 10*3/uL (ref 0.0–0.7)
Eosinophils Relative: 5 %
HEMATOCRIT: 34.4 % — AB (ref 36.0–46.0)
HEMOGLOBIN: 10.1 g/dL — AB (ref 12.0–15.0)
LYMPHS ABS: 3 10*3/uL (ref 0.7–4.0)
Lymphocytes Relative: 26 %
MCH: 25.5 pg — AB (ref 26.0–34.0)
MCHC: 29.4 g/dL — AB (ref 30.0–36.0)
MCV: 86.9 fL (ref 78.0–100.0)
MONOS PCT: 14 %
Monocytes Absolute: 1.7 10*3/uL — ABNORMAL HIGH (ref 0.1–1.0)
NEUTROS ABS: 6.3 10*3/uL (ref 1.7–7.7)
NEUTROS PCT: 54 %
Platelets: 42 10*3/uL — ABNORMAL LOW (ref 150–400)
RBC: 3.96 MIL/uL (ref 3.87–5.11)
RDW: 17.7 % — ABNORMAL HIGH (ref 11.5–15.5)
WBC: 11.6 10*3/uL — ABNORMAL HIGH (ref 4.0–10.5)

## 2015-11-01 LAB — VITAMIN B12: VITAMIN B 12: 390 pg/mL (ref 180–914)

## 2015-11-01 LAB — BRAIN NATRIURETIC PEPTIDE: B NATRIURETIC PEPTIDE 5: 1581.6 pg/mL — AB (ref 0.0–100.0)

## 2015-11-01 LAB — MAGNESIUM: Magnesium: 1.9 mg/dL (ref 1.7–2.4)

## 2015-11-02 ENCOUNTER — Other Ambulatory Visit (HOSPITAL_COMMUNITY): Payer: Self-pay

## 2015-11-02 LAB — CBC WITH DIFFERENTIAL/PLATELET
BASOS ABS: 0.1 10*3/uL (ref 0.0–0.1)
Basophils Relative: 1 %
EOS PCT: 4 %
Eosinophils Absolute: 0.4 10*3/uL (ref 0.0–0.7)
HEMATOCRIT: 33.8 % — AB (ref 36.0–46.0)
HEMOGLOBIN: 9.6 g/dL — AB (ref 12.0–15.0)
LYMPHS ABS: 2.8 10*3/uL (ref 0.7–4.0)
LYMPHS PCT: 26 %
MCH: 24.9 pg — AB (ref 26.0–34.0)
MCHC: 28.4 g/dL — ABNORMAL LOW (ref 30.0–36.0)
MCV: 87.6 fL (ref 78.0–100.0)
Monocytes Absolute: 1.6 10*3/uL — ABNORMAL HIGH (ref 0.1–1.0)
Monocytes Relative: 15 %
NEUTROS PCT: 53 %
Neutro Abs: 5.6 10*3/uL (ref 1.7–7.7)
PLATELETS: 65 10*3/uL — AB (ref 150–400)
RBC: 3.86 MIL/uL — AB (ref 3.87–5.11)
RDW: 17.7 % — ABNORMAL HIGH (ref 11.5–15.5)
WBC: 10.6 10*3/uL — AB (ref 4.0–10.5)

## 2015-11-02 LAB — BASIC METABOLIC PANEL
ANION GAP: 4 — AB (ref 5–15)
BUN: 39 mg/dL — AB (ref 6–20)
CHLORIDE: 91 mmol/L — AB (ref 101–111)
CO2: 46 mmol/L — AB (ref 22–32)
Calcium: 8.9 mg/dL (ref 8.9–10.3)
Creatinine, Ser: 1.17 mg/dL — ABNORMAL HIGH (ref 0.44–1.00)
GFR calc Af Amer: 50 mL/min — ABNORMAL LOW (ref >60)
GFR, EST NON AFRICAN AMERICAN: 43 mL/min — AB (ref >60)
GLUCOSE: 96 mg/dL (ref 65–99)
POTASSIUM: 4.4 mmol/L (ref 3.5–5.1)
Sodium: 141 mmol/L (ref 135–145)

## 2015-11-02 LAB — FOLATE RBC
FOLATE, RBC: 1497 ng/mL (ref >498)
Folate, Hemolysate: 486.6 ng/mL
HEMATOCRIT: 32.5 % — AB (ref 34.0–46.6)

## 2015-11-02 LAB — TSH: TSH: 3.21 u[IU]/mL (ref 0.350–4.500)

## 2015-11-02 LAB — CK TOTAL AND CKMB (NOT AT ARMC)
CK TOTAL: 17 U/L — AB (ref 38–234)
CK, MB: 2.1 ng/mL (ref 0.5–5.0)
Relative Index: INVALID (ref 0.0–2.5)

## 2015-11-02 LAB — C-REACTIVE PROTEIN: CRP: 2.2 mg/dL — ABNORMAL HIGH (ref <1.0)

## 2015-11-02 LAB — TROPONIN I: TROPONIN I: 0.09 ng/mL — AB (ref <0.03)

## 2015-11-02 LAB — MAGNESIUM: Magnesium: 1.9 mg/dL (ref 1.7–2.4)

## 2015-11-03 ENCOUNTER — Other Ambulatory Visit (HOSPITAL_COMMUNITY): Payer: Self-pay

## 2015-11-03 LAB — COMPREHENSIVE METABOLIC PANEL
ALBUMIN: 2.2 g/dL — AB (ref 3.5–5.0)
ALK PHOS: 124 U/L (ref 38–126)
ALT: 20 U/L (ref 14–54)
ANION GAP: 6 (ref 5–15)
AST: 22 U/L (ref 15–41)
BILIRUBIN TOTAL: 0.6 mg/dL (ref 0.3–1.2)
BUN: 46 mg/dL — AB (ref 6–20)
CALCIUM: 8.5 mg/dL — AB (ref 8.9–10.3)
CO2: 42 mmol/L — ABNORMAL HIGH (ref 22–32)
Chloride: 88 mmol/L — ABNORMAL LOW (ref 101–111)
Creatinine, Ser: 1.38 mg/dL — ABNORMAL HIGH (ref 0.44–1.00)
GFR calc Af Amer: 41 mL/min — ABNORMAL LOW (ref >60)
GFR, EST NON AFRICAN AMERICAN: 36 mL/min — AB (ref >60)
GLUCOSE: 191 mg/dL — AB (ref 65–99)
Potassium: 4.6 mmol/L (ref 3.5–5.1)
Sodium: 136 mmol/L (ref 135–145)
TOTAL PROTEIN: 5.7 g/dL — AB (ref 6.5–8.1)

## 2015-11-03 LAB — CBC
HCT: 28 % — ABNORMAL LOW (ref 36.0–46.0)
HEMOGLOBIN: 8.4 g/dL — AB (ref 12.0–15.0)
MCH: 25.2 pg — ABNORMAL LOW (ref 26.0–34.0)
MCHC: 30 g/dL (ref 30.0–36.0)
MCV: 84.1 fL (ref 78.0–100.0)
Platelets: 111 10*3/uL — ABNORMAL LOW (ref 150–400)
RBC: 3.33 MIL/uL — ABNORMAL LOW (ref 3.87–5.11)
RDW: 18 % — AB (ref 11.5–15.5)
WBC: 8.6 10*3/uL (ref 4.0–10.5)

## 2015-11-03 LAB — TROPONIN I
TROPONIN I: 0.08 ng/mL — AB (ref <0.03)
Troponin I: 0.09 ng/mL (ref <0.03)
Troponin I: 0.08 ng/mL (ref <0.03)

## 2015-11-03 LAB — CK TOTAL AND CKMB (NOT AT ARMC)
CK TOTAL: 15 U/L — AB (ref 38–234)
CK TOTAL: 21 U/L — AB (ref 38–234)
CK, MB: 1.6 ng/mL (ref 0.5–5.0)
CK, MB: 1.8 ng/mL (ref 0.5–5.0)
CK, MB: 1.7 ng/mL (ref 0.5–5.0)
Relative Index: INVALID (ref 0.0–2.5)
Relative Index: INVALID (ref 0.0–2.5)
Relative Index: INVALID (ref 0.0–2.5)
Total CK: 14 U/L — ABNORMAL LOW (ref 38–234)

## 2015-11-03 LAB — PROCALCITONIN: PROCALCITONIN: 0.19 ng/mL

## 2015-11-04 LAB — BASIC METABOLIC PANEL
Anion gap: 10 (ref 5–15)
BUN: 48 mg/dL — AB (ref 6–20)
CALCIUM: 8.3 mg/dL — AB (ref 8.9–10.3)
CHLORIDE: 84 mmol/L — AB (ref 101–111)
CO2: 41 mmol/L — AB (ref 22–32)
CREATININE: 1.49 mg/dL — AB (ref 0.44–1.00)
GFR calc Af Amer: 38 mL/min — ABNORMAL LOW (ref >60)
GFR calc non Af Amer: 32 mL/min — ABNORMAL LOW (ref >60)
GLUCOSE: 234 mg/dL — AB (ref 65–99)
Potassium: 4.3 mmol/L (ref 3.5–5.1)
Sodium: 135 mmol/L (ref 135–145)

## 2015-11-04 LAB — CBC
HEMATOCRIT: 27.7 % — AB (ref 36.0–46.0)
HEMOGLOBIN: 8.1 g/dL — AB (ref 12.0–15.0)
MCH: 25 pg — AB (ref 26.0–34.0)
MCHC: 29.2 g/dL — AB (ref 30.0–36.0)
MCV: 85.5 fL (ref 78.0–100.0)
Platelets: 201 10*3/uL (ref 150–400)
RBC: 3.24 MIL/uL — ABNORMAL LOW (ref 3.87–5.11)
RDW: 18.1 % — AB (ref 11.5–15.5)
WBC: 8.7 10*3/uL (ref 4.0–10.5)

## 2015-11-05 ENCOUNTER — Other Ambulatory Visit (HOSPITAL_COMMUNITY): Payer: Self-pay

## 2015-11-05 LAB — RENAL FUNCTION PANEL
Albumin: 2.2 g/dL — ABNORMAL LOW (ref 3.5–5.0)
Anion gap: 12 (ref 5–15)
BUN: 47 mg/dL — AB (ref 6–20)
CHLORIDE: 84 mmol/L — AB (ref 101–111)
CO2: 38 mmol/L — AB (ref 22–32)
CREATININE: 1.49 mg/dL — AB (ref 0.44–1.00)
Calcium: 8.4 mg/dL — ABNORMAL LOW (ref 8.9–10.3)
GFR calc Af Amer: 38 mL/min — ABNORMAL LOW (ref >60)
GFR, EST NON AFRICAN AMERICAN: 32 mL/min — AB (ref >60)
GLUCOSE: 304 mg/dL — AB (ref 65–99)
Phosphorus: 3.7 mg/dL (ref 2.5–4.6)
Potassium: 3.9 mmol/L (ref 3.5–5.1)
Sodium: 134 mmol/L — ABNORMAL LOW (ref 135–145)

## 2015-11-05 LAB — CBC WITH DIFFERENTIAL/PLATELET
Basophils Absolute: 0 10*3/uL (ref 0.0–0.1)
Basophils Relative: 0 %
EOS ABS: 0.4 10*3/uL (ref 0.0–0.7)
EOS PCT: 6 %
HCT: 27.4 % — ABNORMAL LOW (ref 36.0–46.0)
Hemoglobin: 8.3 g/dL — ABNORMAL LOW (ref 12.0–15.0)
LYMPHS ABS: 1.5 10*3/uL (ref 0.7–4.0)
Lymphocytes Relative: 22 %
MCH: 25.5 pg — AB (ref 26.0–34.0)
MCHC: 30.3 g/dL (ref 30.0–36.0)
MCV: 84.3 fL (ref 78.0–100.0)
Monocytes Absolute: 0.7 10*3/uL (ref 0.1–1.0)
Monocytes Relative: 11 %
Neutro Abs: 4.2 10*3/uL (ref 1.7–7.7)
Neutrophils Relative %: 61 %
PLATELETS: 275 10*3/uL (ref 150–400)
RBC: 3.25 MIL/uL — AB (ref 3.87–5.11)
RDW: 18.6 % — AB (ref 11.5–15.5)
WBC: 6.8 10*3/uL (ref 4.0–10.5)

## 2015-11-05 LAB — MAGNESIUM: MAGNESIUM: 2 mg/dL (ref 1.7–2.4)

## 2015-11-05 LAB — PROCALCITONIN

## 2015-11-06 LAB — CBC WITH DIFFERENTIAL/PLATELET
Basophils Absolute: 0.1 10*3/uL (ref 0.0–0.1)
Basophils Relative: 1 %
EOS PCT: 5 %
Eosinophils Absolute: 0.4 10*3/uL (ref 0.0–0.7)
HCT: 27 % — ABNORMAL LOW (ref 36.0–46.0)
Hemoglobin: 8 g/dL — ABNORMAL LOW (ref 12.0–15.0)
LYMPHS ABS: 2.3 10*3/uL (ref 0.7–4.0)
LYMPHS PCT: 30 %
MCH: 24.9 pg — AB (ref 26.0–34.0)
MCHC: 29.6 g/dL — ABNORMAL LOW (ref 30.0–36.0)
MCV: 84.1 fL (ref 78.0–100.0)
MONO ABS: 0.8 10*3/uL (ref 0.1–1.0)
MONOS PCT: 10 %
Neutro Abs: 4.2 10*3/uL (ref 1.7–7.7)
Neutrophils Relative %: 54 %
PLATELETS: 281 10*3/uL (ref 150–400)
RBC: 3.21 MIL/uL — ABNORMAL LOW (ref 3.87–5.11)
RDW: 19 % — AB (ref 11.5–15.5)
WBC: 7.6 10*3/uL (ref 4.0–10.5)

## 2015-11-06 LAB — RENAL FUNCTION PANEL
Albumin: 2.2 g/dL — ABNORMAL LOW (ref 3.5–5.0)
Anion gap: 7 (ref 5–15)
BUN: 55 mg/dL — AB (ref 6–20)
CHLORIDE: 87 mmol/L — AB (ref 101–111)
CO2: 39 mmol/L — AB (ref 22–32)
CREATININE: 1.7 mg/dL — AB (ref 0.44–1.00)
Calcium: 8.3 mg/dL — ABNORMAL LOW (ref 8.9–10.3)
GFR calc Af Amer: 32 mL/min — ABNORMAL LOW (ref >60)
GFR calc non Af Amer: 28 mL/min — ABNORMAL LOW (ref >60)
Glucose, Bld: 105 mg/dL — ABNORMAL HIGH (ref 65–99)
Phosphorus: 3.4 mg/dL (ref 2.5–4.6)
Potassium: 4.2 mmol/L (ref 3.5–5.1)
Sodium: 133 mmol/L — ABNORMAL LOW (ref 135–145)

## 2015-11-06 LAB — CULTURE, BLOOD (ROUTINE X 2)
CULTURE: NO GROWTH
CULTURE: NO GROWTH

## 2015-11-06 LAB — MAGNESIUM: Magnesium: 1.9 mg/dL (ref 1.7–2.4)

## 2015-11-07 ENCOUNTER — Encounter: Payer: Self-pay | Admitting: Pulmonary Disease

## 2015-11-07 DIAGNOSIS — I314 Cardiac tamponade: Secondary | ICD-10-CM

## 2015-11-07 DIAGNOSIS — I3139 Other pericardial effusion (noninflammatory): Secondary | ICD-10-CM

## 2015-11-07 DIAGNOSIS — J90 Pleural effusion, not elsewhere classified: Secondary | ICD-10-CM | POA: Diagnosis not present

## 2015-11-07 DIAGNOSIS — Z9689 Presence of other specified functional implants: Secondary | ICD-10-CM

## 2015-11-07 DIAGNOSIS — J9600 Acute respiratory failure, unspecified whether with hypoxia or hypercapnia: Secondary | ICD-10-CM | POA: Diagnosis not present

## 2015-11-07 DIAGNOSIS — J9621 Acute and chronic respiratory failure with hypoxia: Secondary | ICD-10-CM

## 2015-11-07 DIAGNOSIS — I313 Pericardial effusion (noninflammatory): Secondary | ICD-10-CM

## 2015-11-07 NOTE — Consult Note (Signed)
Name: Lisa [name] MRN: [id number] DOB: April 07, 1935    ADMISSION DATE:  10/24/2015 CONSULTATION DATE: 10/4  REFERRING MD : [facility]  CHIEF COMPLAINT:  SOB  BRIEF PATIENT DESCRIPTION: 80 yo slavic female with left ct  SIGNIFICANT EVENTS    STUDIES:     HISTORY OF PRESENT ILLNESS:  80 yo  [place] female who was discharged from [facility] on 09/25/15 per [facility] to home with a plethora of health issues as noted on PMH section. She was admitted to [facility] 10/14/15 for CHF exacerbation and while in [place] had a PEA arrest and was coded. CT scan revealed pericardial effusion which was drained and bilateral chest tubes placed. The right CT has been removed and left remains in placed with 175 cc daily serous drainage on 20 cm of wall suction while in [facility]. She was transferred to [facility] on 9/20 and on 10/4 PCCM consulted as to left chest and possible removal and or placement of semi permament chest drain ie pleur ex cath per IR.Would suggest to place to water seal and check CxR in am 10/5 and if no pnx then dc chest tube. If pleural effusion re accumulates and  Becomes pathological then possible pleuryx cath.  PAST MEDICAL HISTORY :   has a past medical history of Anemia; Carotid arterial disease ([place]); Chronic combined systolic and diastolic CHF (congestive heart failure) ([place]); CKD (chronic kidney disease), stage III; Diabetes mellitus without complication ([place]); Essential hypertension; Mild aortic stenosis; OSA on CPAP; Osteoarthritis of both ankles; Persistent atrial fibrillation ([place]); and Vitamin D deficiency.  has a past surgical history that includes Cholecystectomy. Prior to Admission medications   Medication Sig Start Date End Date Taking? Authorizing Provider  butalbital-acetaminophen-caffeine (FIORICET, ESGIC) 225 047 6695 MG tablet Take 1 tablet by mouth every 6 (six) hours as needed for headache. 09/25/15   Lavina Hamman, MD  carvedilol (COREG) 12.5 MG tablet Take 1 tablet (12.5 mg total) by  mouth 2 (two) times daily with a meal. 09/25/15   Lavina Hamman, MD  docusate sodium (COLACE) 100 MG capsule Take 1 capsule (100 mg total) by mouth daily. 09/25/15   Lavina Hamman, MD  furosemide (LASIX) 40 MG tablet Take 1 tablet (40 mg total) by mouth 2 (two) times daily. 09/25/15   Lavina Hamman, MD  guaiFENesin (MUCINEX) 600 MG 12 hr tablet Take 1 tablet (600 mg total) by mouth 2 (two) times daily. 09/25/15   Lavina Hamman, MD  hydrALAZINE (APRESOLINE) 25 MG tablet Take 1.5 tablets (37.5 mg total) by mouth every 8 (eight) hours. 09/25/15   Lavina Hamman, MD  insulin aspart (NOVOLOG FLEXPEN) 100 UNIT/ML FlexPen Inject 10-15 Units into the skin 3 (three) times daily with meals. Pt uses per sliding scale.    Historical Provider, MD  insulin glargine (LANTUS) 100 UNIT/ML injection Inject 0.4 mLs (40 Units total) into the skin at bedtime. 09/25/15   Lavina Hamman, MD  metFORMIN (GLUCOPHAGE) 500 MG tablet Take 1 tablet (500 mg total) by mouth 2 (two) times daily with a meal. 08/02/15   Philemon Kingdom, MD  polyethylene glycol (MIRALAX / GLYCOLAX) packet Take 17 g by mouth daily. 09/25/15   Lavina Hamman, MD  rivaroxaban (XARELTO) 15 MG TABS tablet Take 1 tablet (15 mg total) by mouth daily with supper. 09/25/15   Lavina Hamman, MD  rosuvastatin (CRESTOR) 10 MG tablet Take 1 tablet (10 mg total) by mouth daily. 09/28/14   Isaiah Serge, NP  Allergies  Allergen Reactions  . Lisinopril Cough    FAMILY HISTORY:  family history includes Diabetes in her mother; Hyperlipidemia in her mother; Hypertension in her mother; Stroke in her sister. SOCIAL HISTORY:  reports that she has quit smoking. She has never used smokeless tobacco. She reports that she does not drink alcohol or use drugs.  REVIEW OF SYSTEMS:   NA  SUBJECTIVE:  NAD  VITAL SIGNS: BP: ()/()  Arterial Line BP: ()/()   PHYSICAL EXAMINATION: General:  Disheveled slavic female NAD at rest Neuro: MAEx4. Language barrier HEENT: No jvd  /LAN Cardiovascular:  HSIR IR Lungs:  Decreased bs with faint rt basilar crackles Abdomen:  Soft +bs Musculoskeletal:  intact Skin:  Warm dry, scant edema   Recent Labs Lab 11/04/15 0634 11/05/15 1031 11/06/15 0535  NA 135 134* 133*  K 4.3 3.9 4.2  CL 84* 84* 87*  CO2 41* 38* 39*  BUN 48* 47* 55*  CREATININE 1.49* 1.49* 1.70*  GLUCOSE 234* 304* 105*    Recent Labs Lab 11/04/15 0634 11/05/15 1031 11/06/15 0535  HGB 8.1* 8.3* 8.0*  HCT 27.7* 27.4* 27.0*  WBC 8.7 6.8 7.6  PLT 201 275 281   No results found.  ASSESSMENT    Chest tube in place left    Pericardial effusion  post drainage 10/17/15   Atrial fibrillation with RVR (HCC)   Essential hypertension   Acute on chronic combined systolic and diastolic heart failure (HCC)   Aortic stenosis due to bicuspid aortic valve   OSA on CPAP   CKD (chronic kidney disease), stage III   Uncontrolled type 2 diabetes mellitus with circulatory disorder, with long-term current use of insulin [facility])   Liver lesion    Discussion: 80 yo  [place] female who was discharged from [facility] on 09/25/15 per [facility] to home with a plethora of health issues as noted on PMH section. She was admitted to [facility] 10/14/15 for CHF exacerbation and while in [place] had a PEA arrest and was coded. CT scan revealed pericardial effusion which was drained and bilateral chest tubes placed. The right CT has been removed and left remains in placed with 175 cc daily serous drainage on 20 cm of wall suction while in [facility]. She was transferred to [facility] on 9/20 and on 10/4 PCCM consulted as to left chest and possible removal and or placement of semi permament chest drain ie pleur ex cath per IR.Would suggest to place to water seal and check CxR in am 10/5 and if no pnx then dc chest tube. If pleural effusion re accumulates and  Becomes pathological then possible pleuryx cath.   PLAN: 1, place ct to water seal 2. CxR 10/5 if no pnx then dc left chest tube.  3. Follow  serial chest xrays. 4. May need pleuryx cath in future. 5. Keep dry as possible.  Richardson Landry Minor ACNP Maryanna Shape PCCM Pager [phone] till 3 pm If no answer page [phone] 11/07/2015, 11:14 AM   STAFF NOTE: I, Merrie Roof, MD FACP have personally reviewed patient's available data, including medical history, events of note, physical examination and test results as part of my evaluation. I have discussed with resident/NP and other care providers such as pharmacist, RN and RRT. In addition, I personally evaluated patient and elicited key findings of: awake, in chair, speaking a different language, reasonable aeration left chest during inspiration, chets xray with reduced effusion significantly and output does approach 150 daily, would water seal, assess pcxr in  am and output, if output less 175-150 daily would dc chest tube then, keep neg balance, will follow  Lavon Paganini. Titus Mould, MD, [place] Pgr: [facility] 11/07/2015 1:59 PM

## 2015-11-08 ENCOUNTER — Other Ambulatory Visit (HOSPITAL_COMMUNITY): Payer: Self-pay

## 2015-11-08 ENCOUNTER — Ambulatory Visit: Payer: Medicare Other | Admitting: Internal Medicine

## 2015-11-08 DIAGNOSIS — S270XXS Traumatic pneumothorax, sequela: Secondary | ICD-10-CM | POA: Diagnosis not present

## 2015-11-08 DIAGNOSIS — J9601 Acute respiratory failure with hypoxia: Secondary | ICD-10-CM

## 2015-11-08 DIAGNOSIS — R0902 Hypoxemia: Secondary | ICD-10-CM

## 2015-11-08 DIAGNOSIS — Z978 Presence of other specified devices: Secondary | ICD-10-CM

## 2015-11-08 DIAGNOSIS — Z0289 Encounter for other administrative examinations: Secondary | ICD-10-CM

## 2015-11-08 LAB — CBC WITH DIFFERENTIAL/PLATELET
BASOS PCT: 1 %
Basophils Absolute: 0.1 10*3/uL (ref 0.0–0.1)
EOS ABS: 0.5 10*3/uL (ref 0.0–0.7)
Eosinophils Relative: 7 %
HEMATOCRIT: 28.1 % — AB (ref 36.0–46.0)
HEMOGLOBIN: 8.4 g/dL — AB (ref 12.0–15.0)
Lymphocytes Relative: 22 %
Lymphs Abs: 1.7 10*3/uL (ref 0.7–4.0)
MCH: 25.7 pg — ABNORMAL LOW (ref 26.0–34.0)
MCHC: 29.9 g/dL — AB (ref 30.0–36.0)
MCV: 85.9 fL (ref 78.0–100.0)
MONOS PCT: 11 %
Monocytes Absolute: 0.8 10*3/uL (ref 0.1–1.0)
NEUTROS ABS: 4.6 10*3/uL (ref 1.7–7.7)
NEUTROS PCT: 59 %
Platelets: 383 10*3/uL (ref 150–400)
RBC: 3.27 MIL/uL — AB (ref 3.87–5.11)
RDW: 19.1 % — ABNORMAL HIGH (ref 11.5–15.5)
WBC: 7.7 10*3/uL (ref 4.0–10.5)

## 2015-11-08 LAB — RENAL FUNCTION PANEL
ALBUMIN: 2.3 g/dL — AB (ref 3.5–5.0)
ANION GAP: 9 (ref 5–15)
BUN: 53 mg/dL — ABNORMAL HIGH (ref 6–20)
CALCIUM: 8.4 mg/dL — AB (ref 8.9–10.3)
CO2: 37 mmol/L — ABNORMAL HIGH (ref 22–32)
Chloride: 89 mmol/L — ABNORMAL LOW (ref 101–111)
Creatinine, Ser: 1.5 mg/dL — ABNORMAL HIGH (ref 0.44–1.00)
GFR, EST AFRICAN AMERICAN: 37 mL/min — AB (ref >60)
GFR, EST NON AFRICAN AMERICAN: 32 mL/min — AB (ref >60)
GLUCOSE: 144 mg/dL — AB (ref 65–99)
PHOSPHORUS: 3.3 mg/dL (ref 2.5–4.6)
POTASSIUM: 4.6 mmol/L (ref 3.5–5.1)
SODIUM: 135 mmol/L (ref 135–145)

## 2015-11-08 LAB — MAGNESIUM: MAGNESIUM: 2 mg/dL (ref 1.7–2.4)

## 2015-11-08 NOTE — Progress Notes (Signed)
Name: Verenis Nicosia MRN: [id number] DOB: [birth year]    ADMISSION DATE:  10/24/2015 CONSULTATION DATE: 10/4  REFERRING MD : [facility]  CHIEF COMPLAINT:  SOB  BRIEF PATIENT DESCRIPTION: 80 yo slavic female with left ct  SIGNIFICANT EVENTS    STUDIES:     HISTORY OF PRESENT ILLNESS:  80 yo  [place] female who was discharged from [facility] on 09/25/15 per [facility] service to home with a plethora of health issues as noted on PMH section. She was admitted to [facility] 10/14/15 for CHF exacerbation and while in [place] had a PEA arrest and was coded. CT scan revealed pericardial effusion which was drained and bilateral chest tubes placed. The right CT has been removed and left remains in placed with 175 cc daily serous drainage on 20 cm of wall suction while in [facility]. She was transferred to [facility] on 9/20 and on 10/4 PCCM consulted as to left chest and possible removal and or placement of semi permament chest drain ie pleur ex cath per IR.Would suggest to place to water seal and check CxR in am 10/5 and if no pnx then dc chest tube. If pleural effusion re accumulates and  Becomes pathological then possible pleuryx cath.   SUBJECTIVE:  NAD  VITAL SIGNS: 97.5 91 12 121/61 96%  PHYSICAL EXAMINATION: General:  Disheveled slavic female NAD at rest, eating Neuro: MAEx4. Language barrier HEENT: No jvd /LAN Cardiovascular:  HSIR IR Lungs:  Decreased bs with faint rt basilar crackles, left ct to water seal with 70 cc serous drainage in 24hours Abdomen:  Soft +bs Musculoskeletal:  intact Skin:  Warm dry, scant edema   Recent Labs Lab 11/05/15 1031 11/06/15 0535 11/08/15 0645  NA 134* 133* 135  K 3.9 4.2 4.6  CL 84* 87* 89*  CO2 38* 39* 37*  BUN 47* 55* 53*  CREATININE 1.49* 1.70* 1.50*  GLUCOSE 304* 105* 144*    Recent Labs Lab 11/05/15 1031 11/06/15 0535 11/08/15 0645  HGB 8.3* 8.0* 8.4*  HCT 27.4* 27.0* 28.1*  WBC 6.8 7.6 7.7  PLT 275 281 383   Dg Chest Port 1 View  Result Date:  11/08/2015 CLINICAL DATA:  Pleural effusion.  Left chest tube. EXAM: PORTABLE CHEST 1 VIEW COMPARISON:  11/05/2015 FINDINGS: The cardiac silhouette remains enlarged. Thoracic aortic calcification is again seen. Left-sided chest tube is unchanged. No pneumothorax is identified. Hazy densities remain in both lung bases with partial obscuration of the diaphragms, not significantly changed. Mild bilateral interstitial prominence is unchanged. IMPRESSION: 1. No pneumothorax identified. 2. Unchanged small pleural effusions and bibasilar atelectasis. Electronically Signed   By: Logan Bores M.D.   On: 11/08/2015 07:36    ASSESSMENT    Chest tube in place left    Pericardial effusion  post drainage 10/17/15   Atrial fibrillation with RVR (HCC)   Essential hypertension   Acute on chronic combined systolic and diastolic heart failure (HCC)   Aortic stenosis due to bicuspid aortic valve   OSA on CPAP   CKD (chronic kidney disease), stage III   Uncontrolled type 2 diabetes mellitus with circulatory disorder, with long-term current use of insulin [facility])   Liver lesion    Discussion: 80 yo  [place] female who was discharged from [facility] on 09/25/15 per [facility] service to home with a plethora of health issues as noted on PMH section. She was admitted to [facility] 10/14/15 for CHF exacerbation and while in [place] had a PEA arrest and was coded. CT scan revealed  pericardial effusion which was drained and bilateral chest tubes placed. The right CT has been removed and left remains in placed with 175 cc daily serous drainage on 20 cm of wall suction while in [facility]. She was transferred to [facility] on 9/20 and on 10/4 PCCM consulted as to left chest and possible removal and or placement of semi permament chest drain ie pleur ex cath per IR.Would suggest to place to water seal and check CxR in am 10/5 and if no pnx then dc chest tube. If pleural effusion re accumulates and  Becomes pathological then possible pleuryx cath.   PLAN: 1,  place ct to water seal on 10/4. 10/5 no pnx 2. CxR 10/5  no pnx then dc left chest tube. Order written to dc left ct 3. Follow serial chest xrays. One for 1400 10/5 and am 10/6 4. May need pleuryx cath in future. 5. Keep dry as possible.  Richardson Landry Minor ACNP Maryanna Shape PCCM Pager [phone] till 3 pm If no answer page [phone] 11/08/2015, 10:33 AM  Attending Note:  80 year old female with a chest tube for bilateral PTX due to CPR and was noted to have a pericardial effusion that required evacuation.  On exam, pleural variations present.  Out put of 70 ml/day.  I reviewed CXR myself, CT in place.  Discussed with PCCM-NP and bedside RN.    Respiratory failure:  - BiPAP at night for chronic respiratory faliure  - Will need arrangement for home BiPAP.  PTX:  - F/U CXR.  - Remove CT.  Chest tube management  - Chest tube to water seal.  - May remove CT.  Hypoxemia:  - Titrate O2 for sat of 88-92%.  - May need ambulatory desaturation study prior to discharge for home O2.  PCCM will sign off, please call back if needed.  Patient seen and examined, agree with above note.  I dictated the care and orders written for this patient under my direction.  Rush Farmer, MD [phone]

## 2015-11-08 NOTE — Progress Notes (Signed)
Assessed patient at bedside for chest tube removal.  Chest tube to water seal without air leak.  AM chest xray reviewed with no residual pneumothorax.  Chest tube pulled without difficulty.  Occlusive (Vaseline gauze) dressing applied.    Plan: Follow up CXR in 4 hours Continue occlusive dressing at site until well healed.   Ok to change if becomes soiled > apply vaseline gauze + dry dressing to site with tape to [place], NP-C [facility] Pgr: [phone] or if no answer [phone] 11/08/2015, 4:19 PM

## 2015-11-09 ENCOUNTER — Other Ambulatory Visit (HOSPITAL_COMMUNITY): Payer: Self-pay

## 2015-11-09 LAB — RENAL FUNCTION PANEL
Albumin: 2.3 g/dL — ABNORMAL LOW (ref 3.5–5.0)
Anion gap: 9 (ref 5–15)
BUN: 47 mg/dL — AB (ref 6–20)
CHLORIDE: 87 mmol/L — AB (ref 101–111)
CO2: 39 mmol/L — AB (ref 22–32)
Calcium: 8.5 mg/dL — ABNORMAL LOW (ref 8.9–10.3)
Creatinine, Ser: 1.49 mg/dL — ABNORMAL HIGH (ref 0.44–1.00)
GFR calc Af Amer: 38 mL/min — ABNORMAL LOW (ref >60)
GFR, EST NON AFRICAN AMERICAN: 32 mL/min — AB (ref >60)
Glucose, Bld: 112 mg/dL — ABNORMAL HIGH (ref 65–99)
POTASSIUM: 4.7 mmol/L (ref 3.5–5.1)
Phosphorus: 2.9 mg/dL (ref 2.5–4.6)
Sodium: 135 mmol/L (ref 135–145)

## 2015-11-09 LAB — CBC WITH DIFFERENTIAL/PLATELET
BASOS ABS: 0.1 10*3/uL (ref 0.0–0.1)
Basophils Relative: 1 %
Eosinophils Absolute: 0.6 10*3/uL (ref 0.0–0.7)
Eosinophils Relative: 7 %
HEMATOCRIT: 26.3 % — AB (ref 36.0–46.0)
Hemoglobin: 7.8 g/dL — ABNORMAL LOW (ref 12.0–15.0)
LYMPHS ABS: 2.1 10*3/uL (ref 0.7–4.0)
LYMPHS PCT: 25 %
MCH: 25.4 pg — AB (ref 26.0–34.0)
MCHC: 29.7 g/dL — ABNORMAL LOW (ref 30.0–36.0)
MCV: 85.7 fL (ref 78.0–100.0)
MONO ABS: 1 10*3/uL (ref 0.1–1.0)
MONOS PCT: 12 %
NEUTROS ABS: 4.7 10*3/uL (ref 1.7–7.7)
Neutrophils Relative %: 55 %
Platelets: 393 10*3/uL (ref 150–400)
RBC: 3.07 MIL/uL — ABNORMAL LOW (ref 3.87–5.11)
RDW: 19.6 % — AB (ref 11.5–15.5)
WBC: 8.4 10*3/uL (ref 4.0–10.5)

## 2015-11-09 LAB — MAGNESIUM: MAGNESIUM: 1.9 mg/dL (ref 1.7–2.4)

## 2015-11-10 ENCOUNTER — Other Ambulatory Visit (HOSPITAL_COMMUNITY): Payer: Self-pay

## 2015-11-10 LAB — RENAL FUNCTION PANEL
ALBUMIN: 2.2 g/dL — AB (ref 3.5–5.0)
ANION GAP: 5 (ref 5–15)
BUN: 42 mg/dL — ABNORMAL HIGH (ref 6–20)
CALCIUM: 8.5 mg/dL — AB (ref 8.9–10.3)
CO2: 41 mmol/L — AB (ref 22–32)
CREATININE: 1.52 mg/dL — AB (ref 0.44–1.00)
Chloride: 90 mmol/L — ABNORMAL LOW (ref 101–111)
GFR, EST AFRICAN AMERICAN: 37 mL/min — AB (ref >60)
GFR, EST NON AFRICAN AMERICAN: 32 mL/min — AB (ref >60)
Glucose, Bld: 70 mg/dL (ref 65–99)
PHOSPHORUS: 3 mg/dL (ref 2.5–4.6)
Potassium: 4.6 mmol/L (ref 3.5–5.1)
SODIUM: 136 mmol/L (ref 135–145)

## 2015-11-10 LAB — CBC WITH DIFFERENTIAL/PLATELET
Basophils Absolute: 0.1 10*3/uL (ref 0.0–0.1)
Basophils Relative: 1 %
Eosinophils Absolute: 0.8 10*3/uL — ABNORMAL HIGH (ref 0.0–0.7)
Eosinophils Relative: 9 %
HEMATOCRIT: 26.7 % — AB (ref 36.0–46.0)
HEMOGLOBIN: 7.8 g/dL — AB (ref 12.0–15.0)
LYMPHS PCT: 29 %
Lymphs Abs: 2.4 10*3/uL (ref 0.7–4.0)
MCH: 25.4 pg — ABNORMAL LOW (ref 26.0–34.0)
MCHC: 29.2 g/dL — AB (ref 30.0–36.0)
MCV: 87 fL (ref 78.0–100.0)
MONO ABS: 0.9 10*3/uL (ref 0.1–1.0)
MONOS PCT: 11 %
NEUTROS ABS: 4.1 10*3/uL (ref 1.7–7.7)
Neutrophils Relative %: 50 %
Platelets: 388 10*3/uL (ref 150–400)
RBC: 3.07 MIL/uL — ABNORMAL LOW (ref 3.87–5.11)
RDW: 19.7 % — AB (ref 11.5–15.5)
WBC: 8.3 10*3/uL (ref 4.0–10.5)

## 2015-11-10 LAB — MAGNESIUM: Magnesium: 1.9 mg/dL (ref 1.7–2.4)

## 2015-11-13 ENCOUNTER — Other Ambulatory Visit (HOSPITAL_COMMUNITY): Payer: Self-pay

## 2015-11-13 LAB — RENAL FUNCTION PANEL
ANION GAP: 8 (ref 5–15)
Albumin: 2.4 g/dL — ABNORMAL LOW (ref 3.5–5.0)
BUN: 37 mg/dL — ABNORMAL HIGH (ref 6–20)
CO2: 39 mmol/L — AB (ref 22–32)
Calcium: 8.8 mg/dL — ABNORMAL LOW (ref 8.9–10.3)
Chloride: 90 mmol/L — ABNORMAL LOW (ref 101–111)
Creatinine, Ser: 1.42 mg/dL — ABNORMAL HIGH (ref 0.44–1.00)
GFR calc Af Amer: 40 mL/min — ABNORMAL LOW (ref >60)
GFR calc non Af Amer: 34 mL/min — ABNORMAL LOW (ref >60)
GLUCOSE: 170 mg/dL — AB (ref 65–99)
PHOSPHORUS: 3.8 mg/dL (ref 2.5–4.6)
POTASSIUM: 4.4 mmol/L (ref 3.5–5.1)
Sodium: 137 mmol/L (ref 135–145)

## 2015-11-13 LAB — CBC WITH DIFFERENTIAL/PLATELET
Basophils Absolute: 0.1 10*3/uL (ref 0.0–0.1)
Basophils Relative: 1 %
Eosinophils Absolute: 0.5 10*3/uL (ref 0.0–0.7)
Eosinophils Relative: 6 %
HEMATOCRIT: 29.8 % — AB (ref 36.0–46.0)
Hemoglobin: 8.5 g/dL — ABNORMAL LOW (ref 12.0–15.0)
LYMPHS ABS: 1.9 10*3/uL (ref 0.7–4.0)
LYMPHS PCT: 21 %
MCH: 25.1 pg — ABNORMAL LOW (ref 26.0–34.0)
MCHC: 28.5 g/dL — AB (ref 30.0–36.0)
MCV: 87.9 fL (ref 78.0–100.0)
MONO ABS: 0.9 10*3/uL (ref 0.1–1.0)
MONOS PCT: 10 %
NEUTROS ABS: 5.9 10*3/uL (ref 1.7–7.7)
Neutrophils Relative %: 62 %
Platelets: 396 10*3/uL (ref 150–400)
RBC: 3.39 MIL/uL — ABNORMAL LOW (ref 3.87–5.11)
RDW: 19.7 % — AB (ref 11.5–15.5)
WBC: 9.3 10*3/uL (ref 4.0–10.5)

## 2015-11-13 LAB — MAGNESIUM: Magnesium: 1.8 mg/dL (ref 1.7–2.4)

## 2016-05-14 ENCOUNTER — Other Ambulatory Visit: Payer: Self-pay

## 2016-05-14 ENCOUNTER — Telehealth: Payer: Self-pay | Admitting: Internal Medicine

## 2016-05-14 MED ORDER — ACCU-CHEK GUIDE W/DEVICE KIT: 1.0000 | PACK | Freq: Every day | 0 refills | Status: DC

## 2016-05-14 NOTE — Telephone Encounter (Signed)
Submitted

## 2016-05-14 NOTE — Telephone Encounter (Signed)
[facility] called stated they need directions and dx code for  Lisa [name] (Phone) [phone] (Fax)

## 2016-10-14 IMAGING — CR DG CHEST 1V PORT
1 series · 1 of 1 positions shown · non-contrast
Comparison: 10/30/2015 and earlier.

CLINICAL DATA: 80-year-old female with pericardial and pleural
effusions. Initial encounter.

EXAM:
PORTABLE CHEST 1 VIEW

[AP]
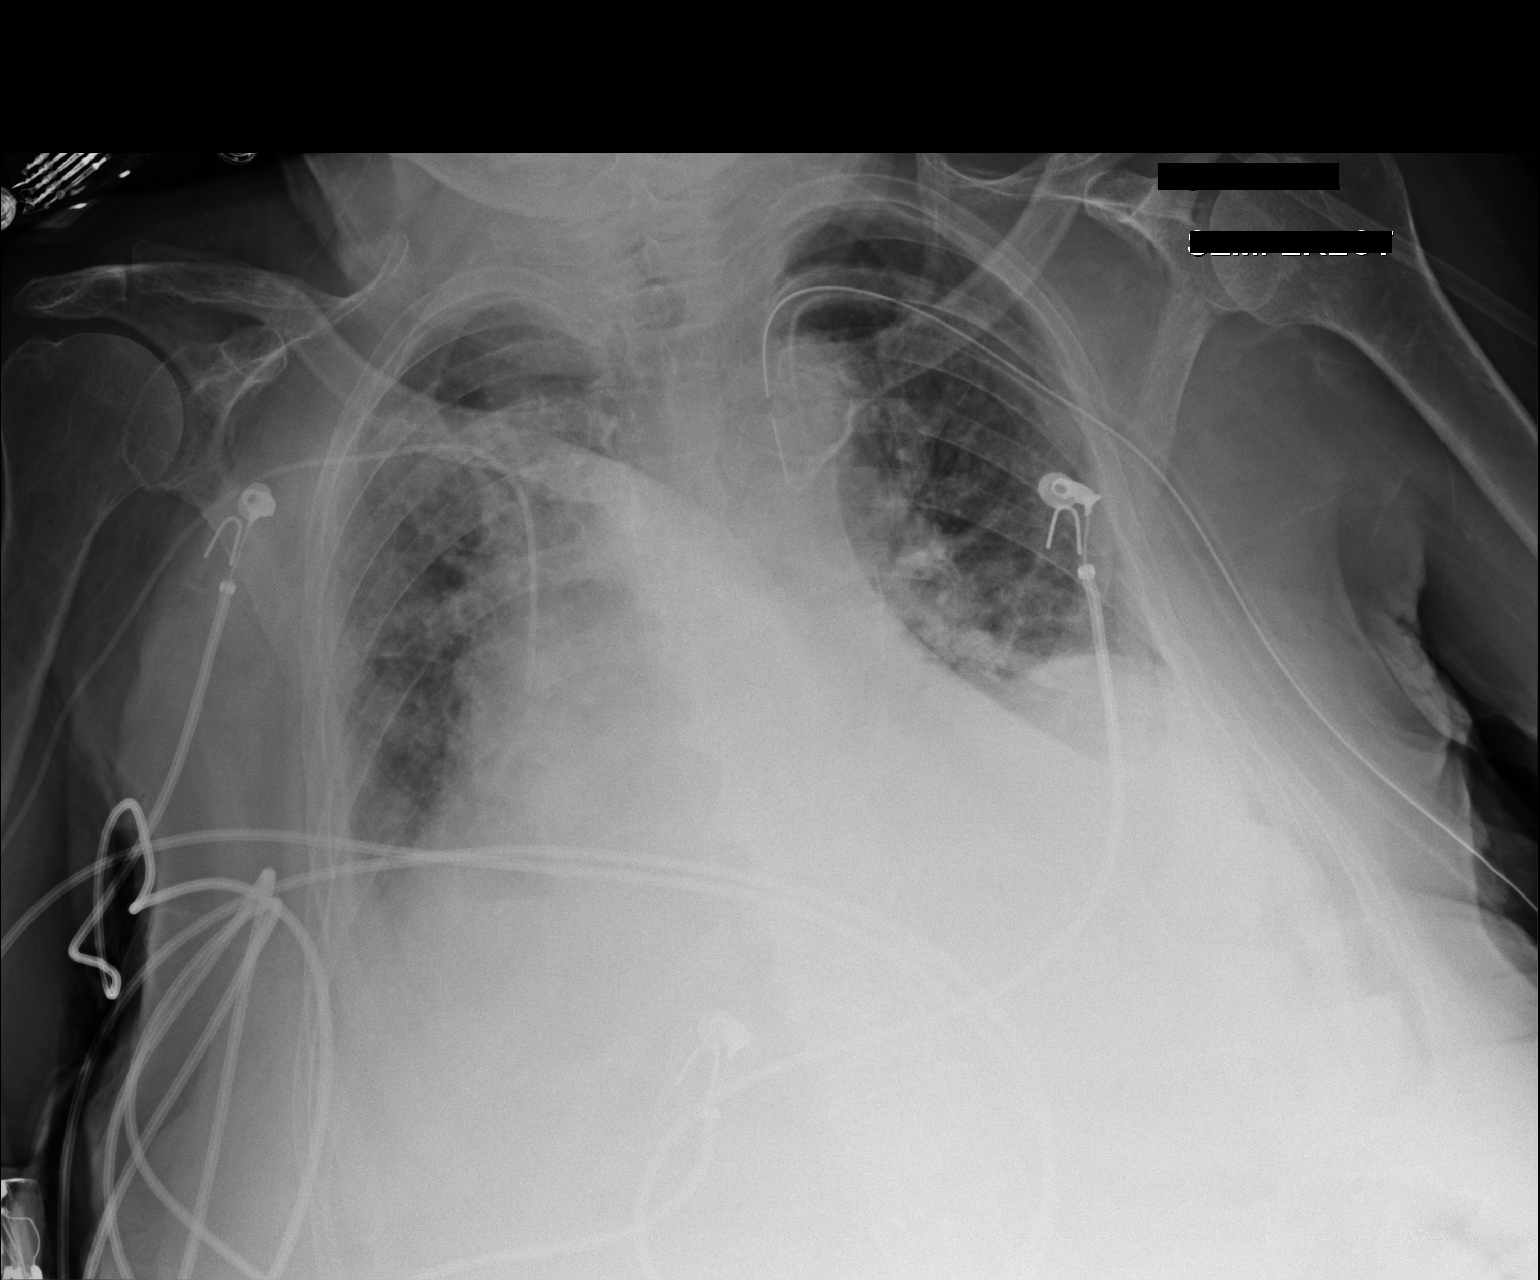

[1 of 1 positions shown; findings below may reference images not displayed]

FINDINGS: Portable AP semi upright view at 0420 hours. Stable left chest tube.
Trace left apical pneumothorax appears stable. Continued moderate
veiling opacity at both lung bases. Continued moderate to severe
enlargement of the cardiac silhouette with a morphology suggesting
continued pericardial effusion. Stable right PICC line. Stable
pulmonary vascularity without overt edema. Stable upper lobe
ventilation.
IMPRESSION: 1. Stable left chest tube with small left apical pneumothorax.
2. Continued pericardial and small to moderate bilateral pleural
effusions.
3. Stable ventilation.

## 2016-10-19 IMAGING — DX DG CHEST 1V PORT
1 series · 1 of 1 positions shown · non-contrast
Comparison: Portable chest x-ray November 03, 2015

CLINICAL DATA: Follow-up pleural effusions, left apical
pneumothorax with chest tube treatment.

EXAM:
PORTABLE CHEST 1 VIEW

[chest ap]
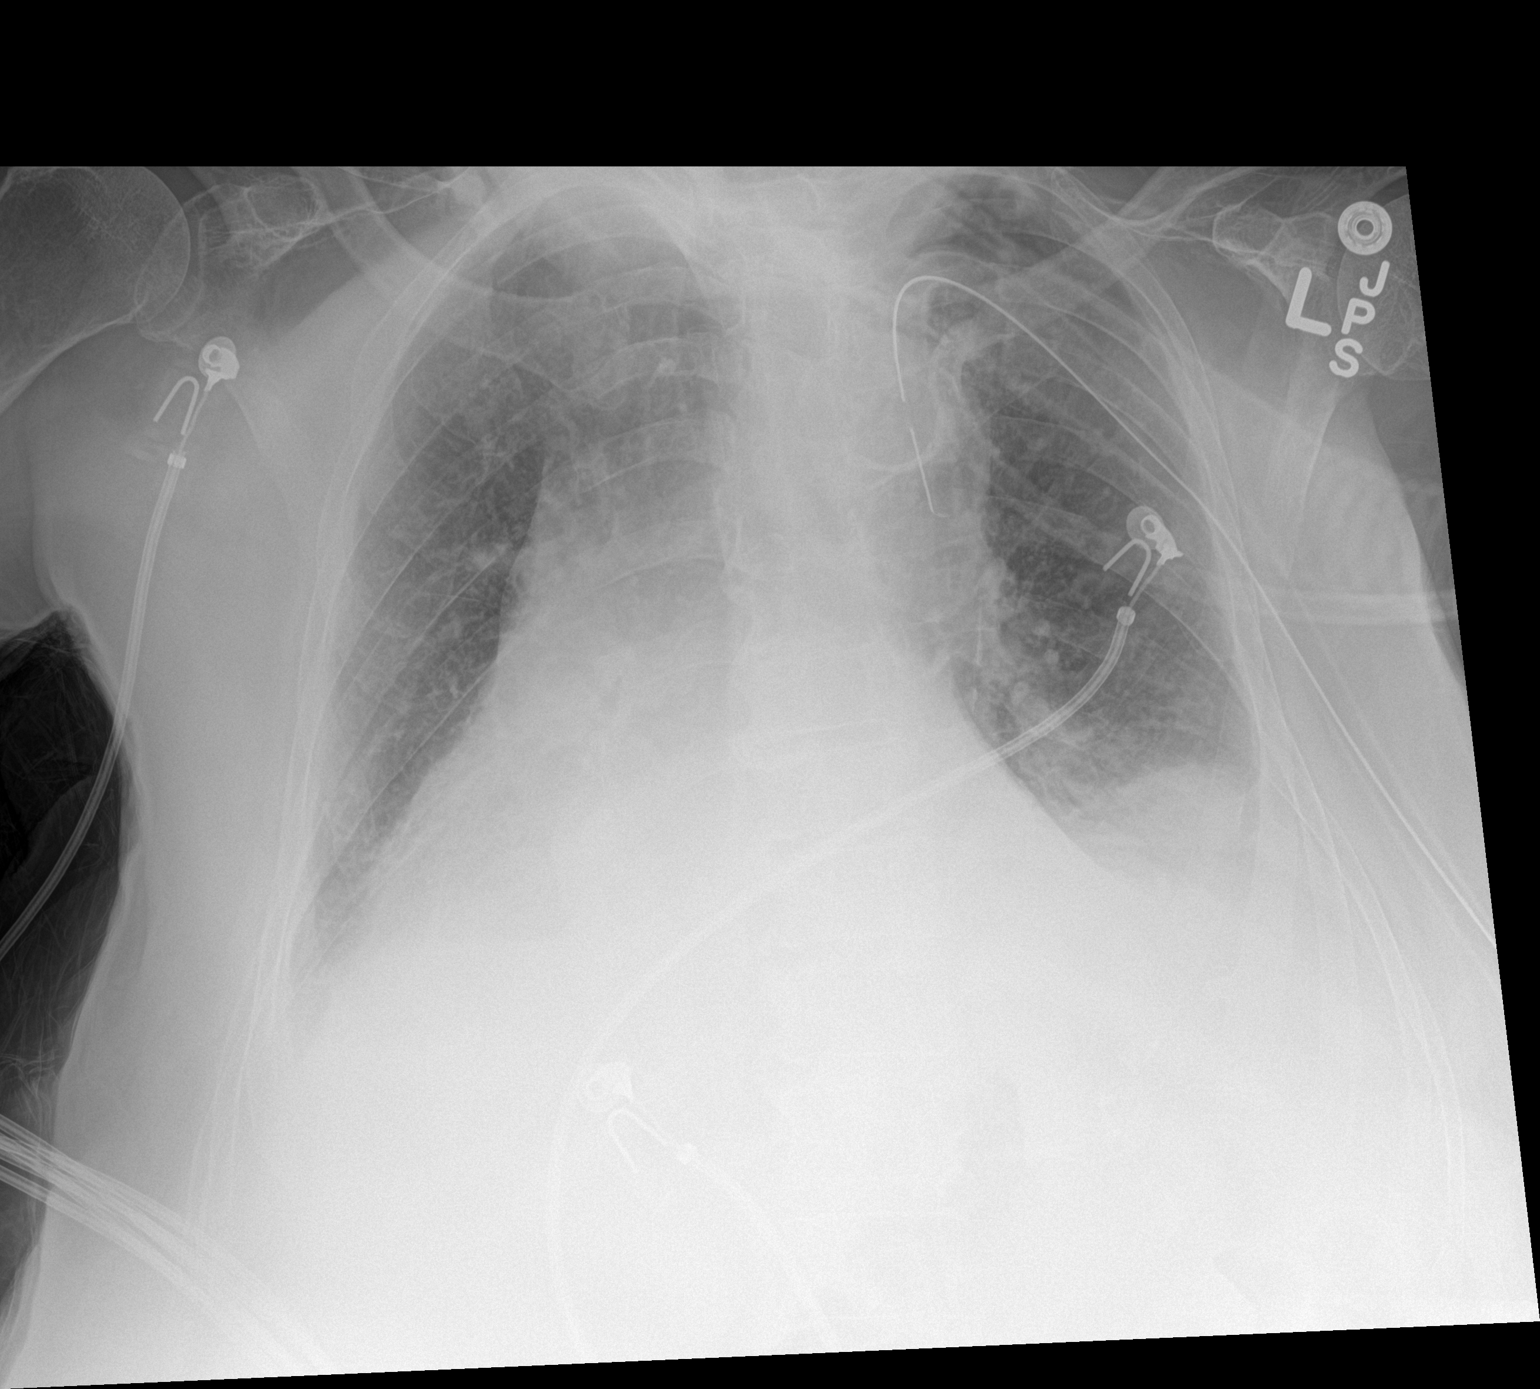

[1 of 1 positions shown; findings below may reference images not displayed]

FINDINGS: No residual pneumothorax is observed. Chest tube is in stable
position with the tip overlying the posterior medial aspect of the
left seventh rib. There is hazy density at the left lung base with
obscuration of the hemidiaphragm. There similar findings on the
right. Both are stable. The cardiac silhouette remains enlarged. The
pulmonary interstitial markings are prominent. There is
calcification in the wall of the aortic arch.
IMPRESSION: Decreased pulmonary interstitial edema stable cardiomegaly.
Persistent bibasilar atelectasis or pneumonia with trace pleural
effusions.

No residual pneumothorax is visible. The chest tube is in stable
position.

Aortic atherosclerosis.

## 2016-10-22 IMAGING — DX DG CHEST 1V PORT
1 series · 1 of 1 positions shown · non-contrast
Comparison: 11/05/2015

CLINICAL DATA: Pleural effusion.  Left chest tube.

EXAM:
PORTABLE CHEST 1 VIEW

[chest ap]
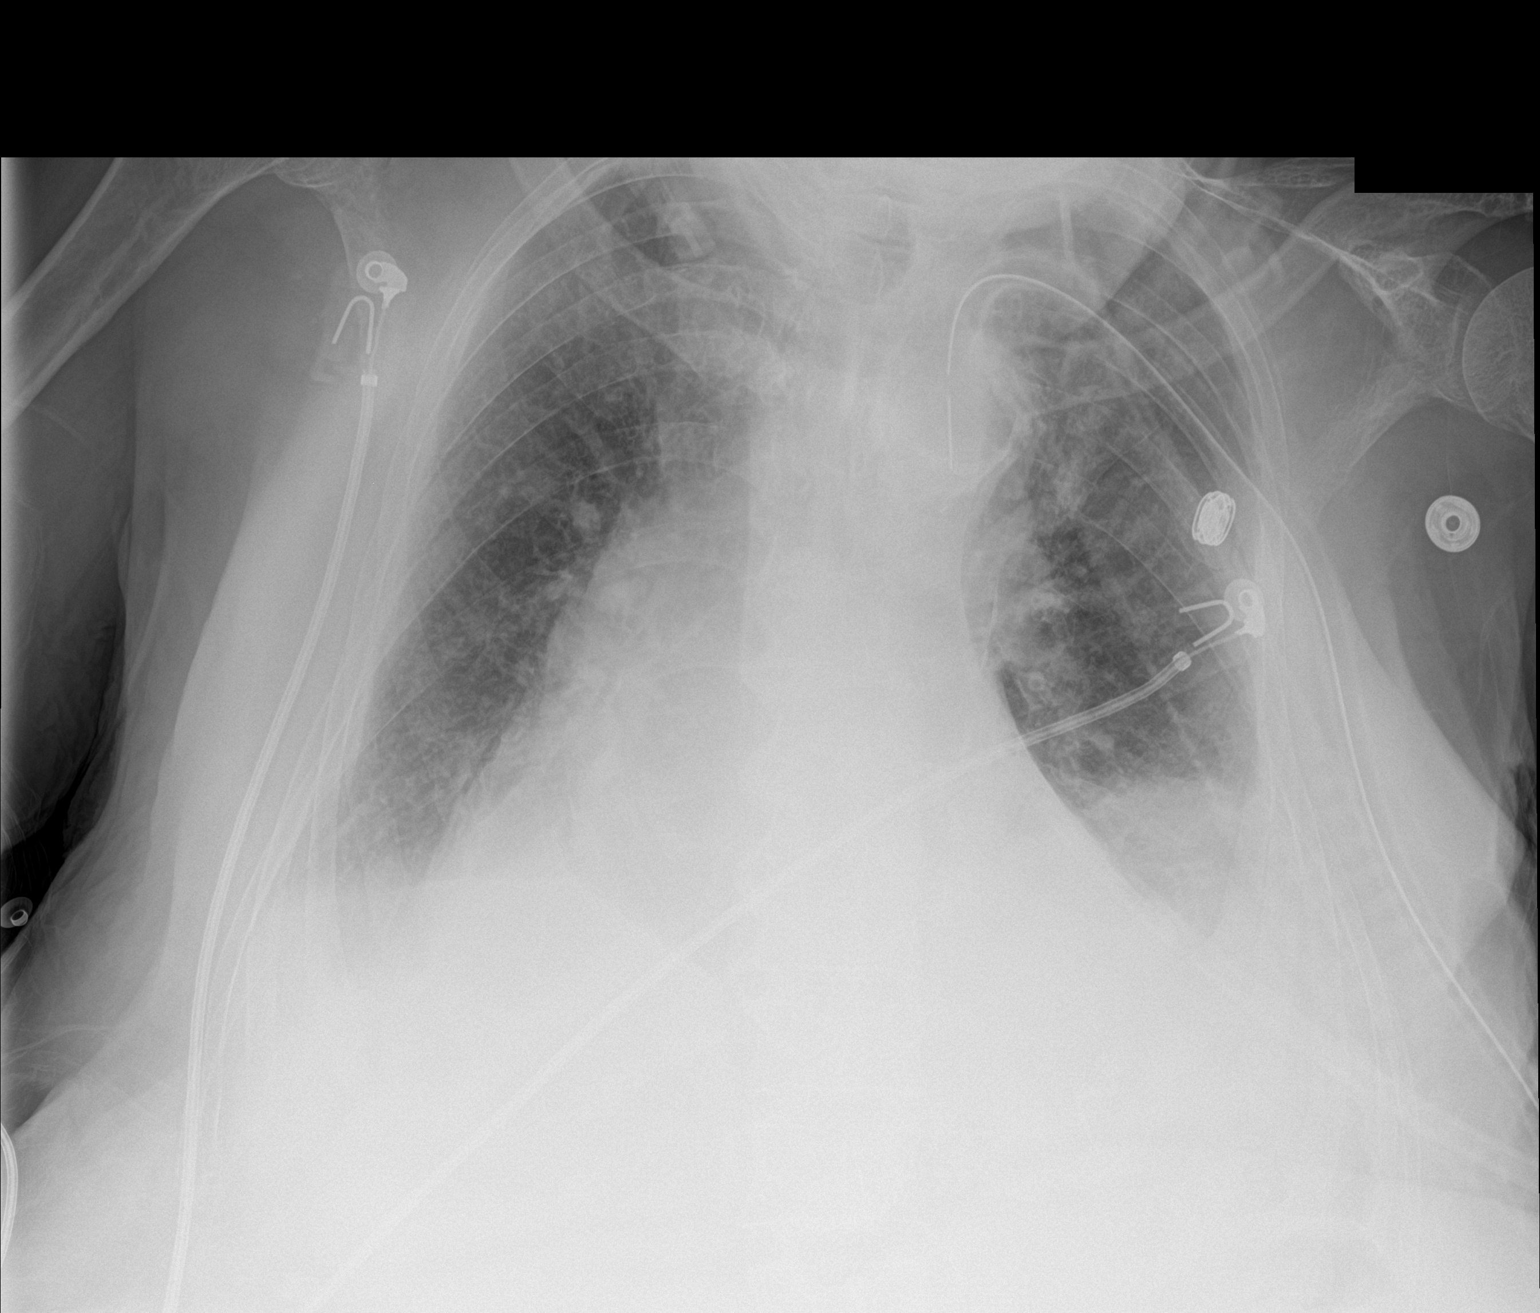

[1 of 1 positions shown; findings below may reference images not displayed]

FINDINGS: The cardiac silhouette remains enlarged. Thoracic aortic
calcification is again seen. Left-sided chest tube is unchanged. No
pneumothorax is identified. Hazy densities remain in both lung bases
with partial obscuration of the diaphragms, not significantly
changed. Mild bilateral interstitial prominence is unchanged.
IMPRESSION: 1. No pneumothorax identified.
2. Unchanged small pleural effusions and bibasilar atelectasis.

## 2016-10-23 IMAGING — CR DG CHEST 1V PORT
1 series · 1 of 1 positions shown · non-contrast
Comparison: November 08, 2015

CLINICAL DATA: Left chest tube removal

EXAM:
PORTABLE CHEST 1 VIEW

[AP]
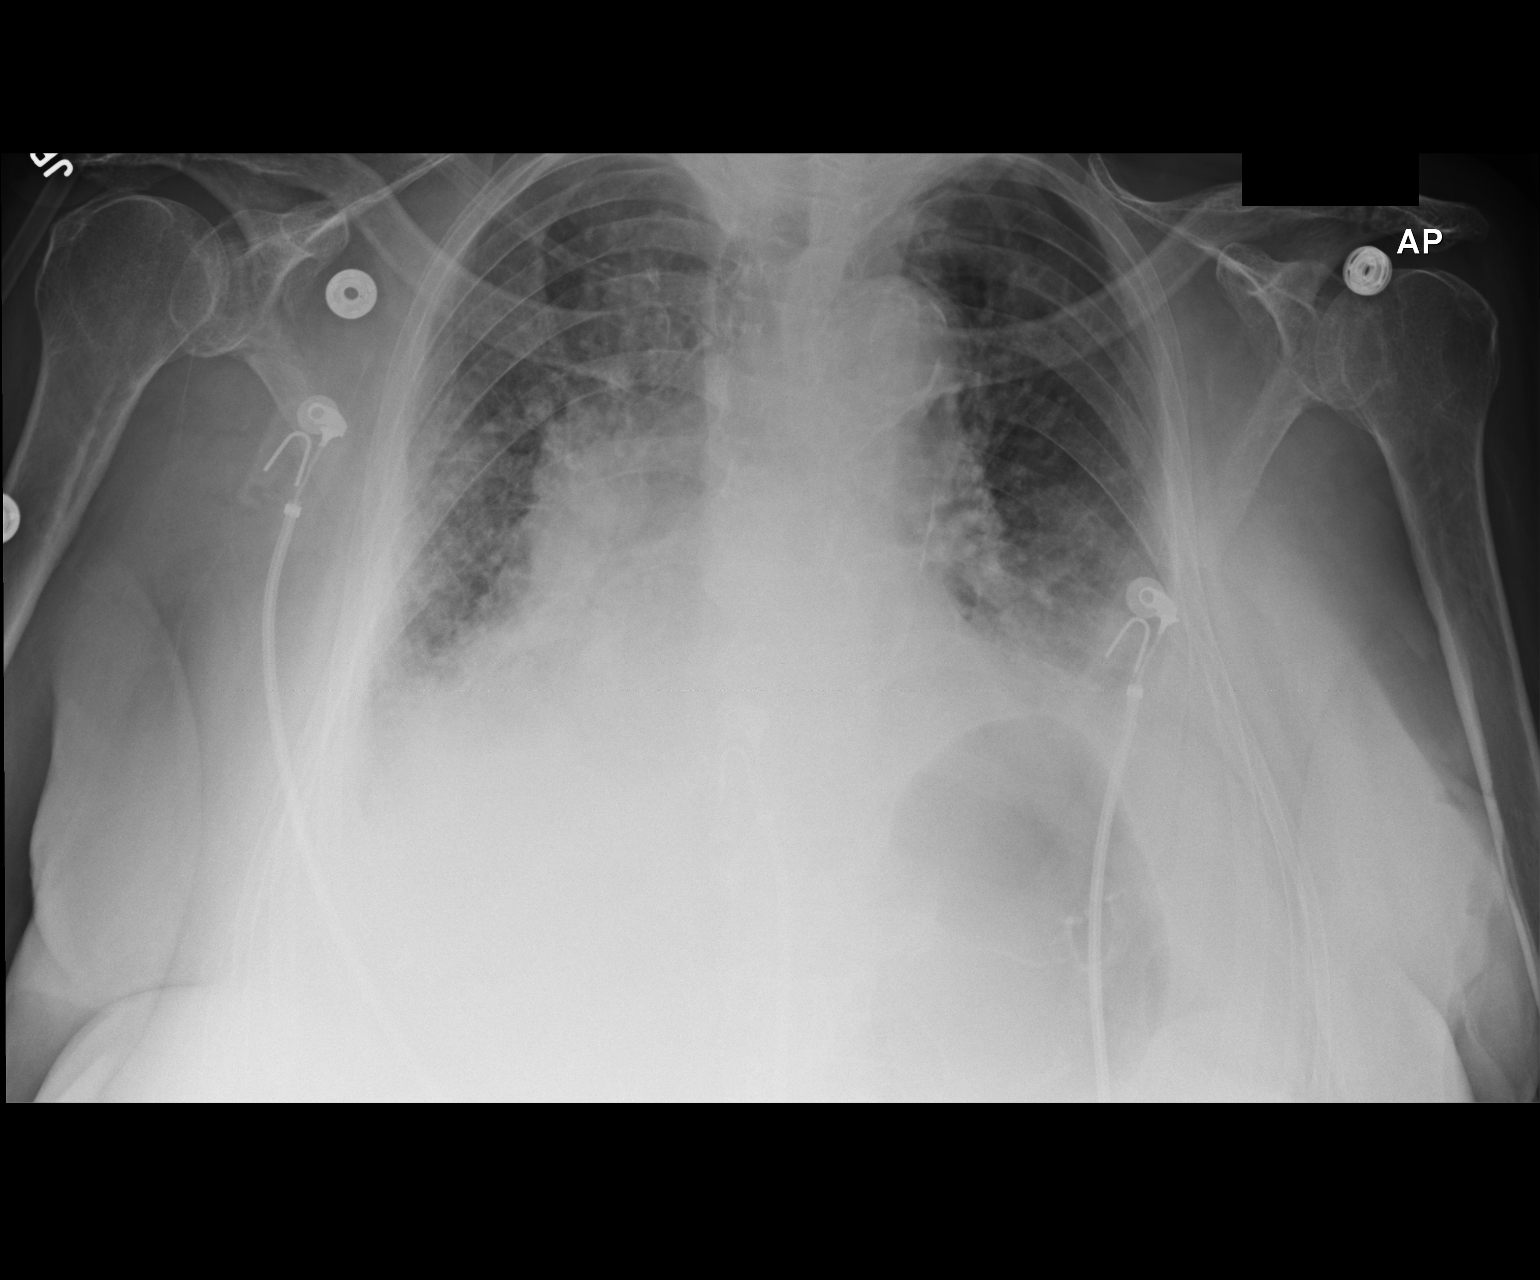

[1 of 1 positions shown; findings below may reference images not displayed]

FINDINGS: There is no evident pneumothorax. There is moderate generalized
interstitial pulmonary edema with small pleural effusions
bilaterally. There is cardiomegaly with mild pulmonary venous
hypertension. There is atelectasis in lung bases. There is aortic
atherosclerosis. No adenopathy.
IMPRESSION: Chest tube no longer appreciable. No pneumothorax. Evidence of
underlying congestive heart failure. Aortic atherosclerosis noted.
Bibasilar atelectasis.

## 2016-10-24 IMAGING — CR DG ABD PORTABLE 1V
1 series · 1 of 1 positions shown · non-contrast
Comparison: Plain film 10/05/2015

CLINICAL DATA: 80-year-old female with a history of fecal impaction

EXAM:
PORTABLE ABDOMEN - 1 VIEW

[AP]
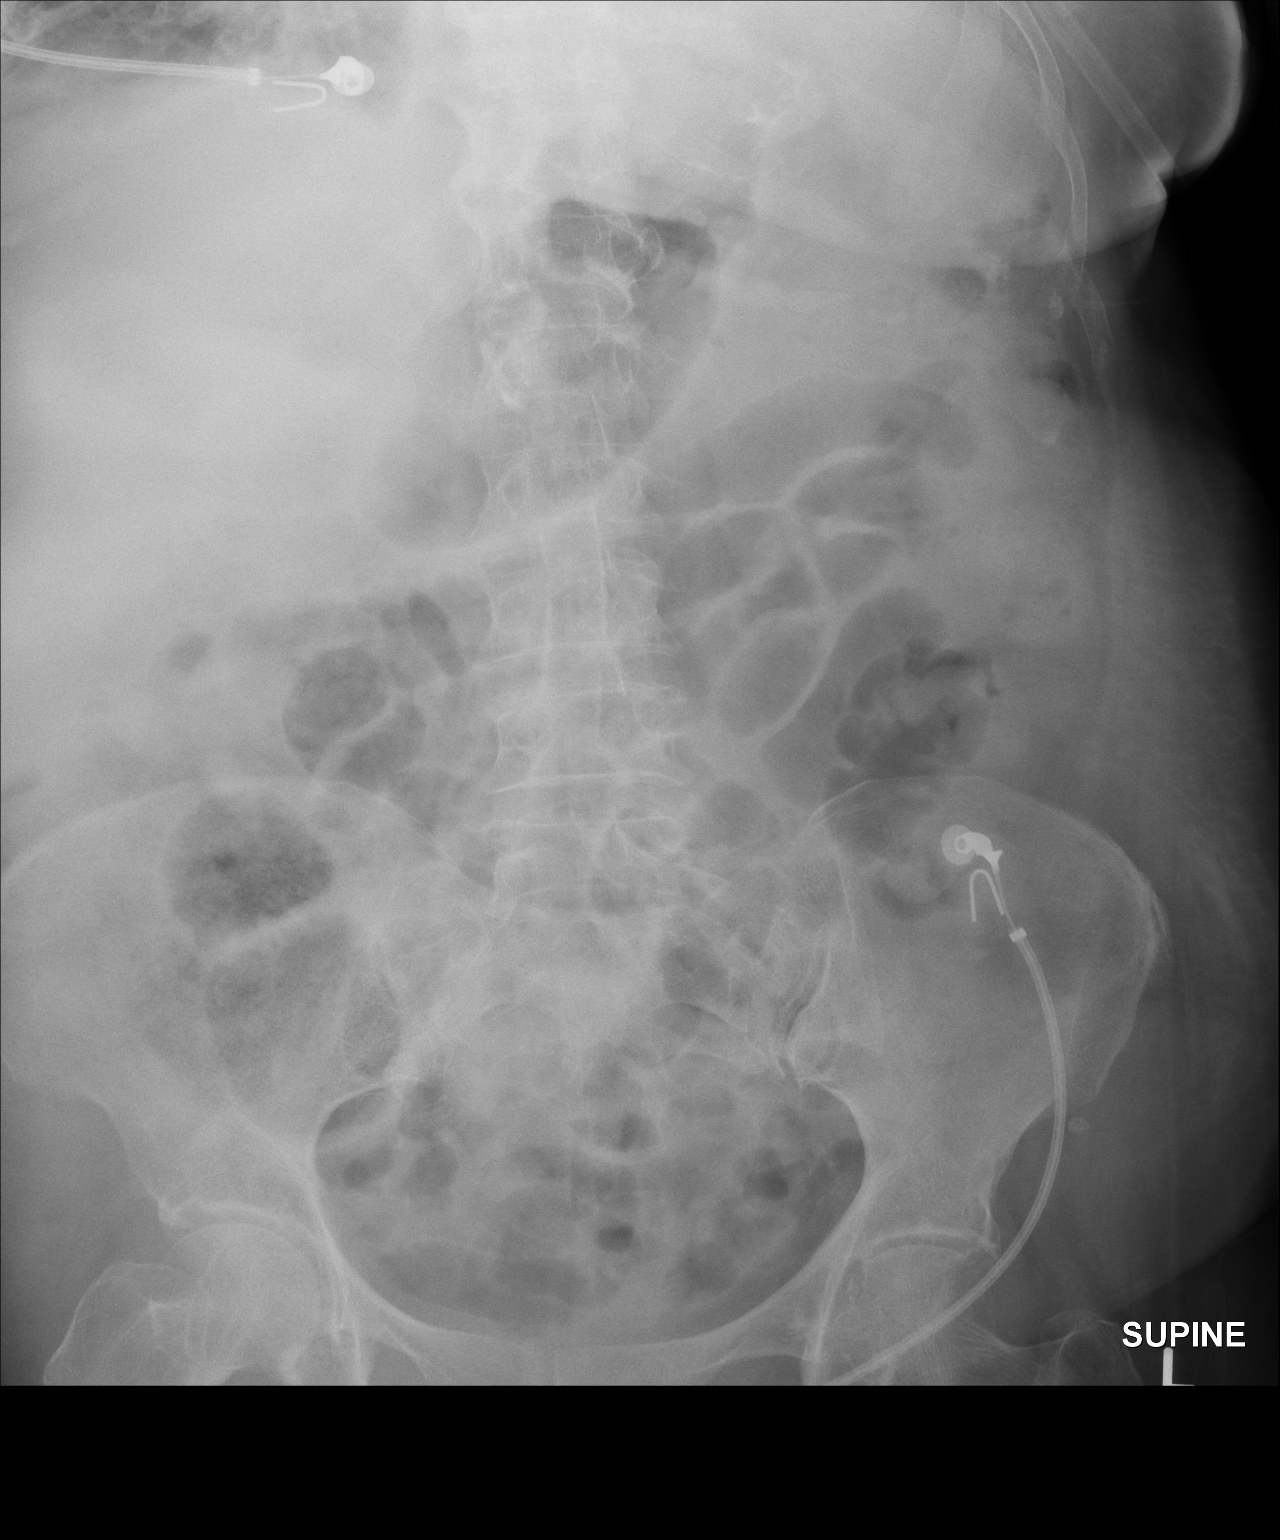

[1 of 1 positions shown; findings below may reference images not displayed]

FINDINGS: Gas within stomach, small bowel, colon.  No abnormal distention.

Limited evaluation the right abdomen, however, there is mild formed
stool burden within the right abdomen. No significant formed stool
within the rectum.

No unexpected radiopaque foreign body.  No unexpected calcification.

No displaced fracture identified.
IMPRESSION: Nonobstructive bowel gas pattern.

Mild stool burden of the right abdomen. No significant formed stool
within the rectum.

## 2017-05-28 ENCOUNTER — Other Ambulatory Visit: Payer: Self-pay | Admitting: Internal Medicine

## 2017-05-29 ENCOUNTER — Other Ambulatory Visit: Payer: Self-pay | Admitting: Internal Medicine

## 2017-05-29 NOTE — Telephone Encounter (Signed)
Last ov 09/14/15 one no show and no upcoming, please advise on refill

## 2017-12-18 ENCOUNTER — Encounter: Payer: Self-pay | Admitting: Internal Medicine

## 2017-12-18 ENCOUNTER — Ambulatory Visit (INDEPENDENT_AMBULATORY_CARE_PROVIDER_SITE_OTHER): Payer: Medicare Other | Admitting: Internal Medicine

## 2017-12-18 VITALS — BP 130/78 | HR 90 | Resp 18 | Ht 61.0 in | Wt 183.0 lb

## 2017-12-18 DIAGNOSIS — N184 Chronic kidney disease, stage 4 (severe): Secondary | ICD-10-CM

## 2017-12-18 DIAGNOSIS — N2581 Secondary hyperparathyroidism of renal origin: Secondary | ICD-10-CM

## 2017-12-18 DIAGNOSIS — Z794 Long term (current) use of insulin: Secondary | ICD-10-CM

## 2017-12-18 DIAGNOSIS — E1122 Type 2 diabetes mellitus with diabetic chronic kidney disease: Secondary | ICD-10-CM

## 2017-12-18 LAB — GLUCOSE, POCT (MANUAL RESULT ENTRY): POC Glucose: 213 mg/dl — AB (ref 70–99)

## 2017-12-18 MED ORDER — INSULIN GLARGINE 100 UNIT/ML SOLOSTAR PEN: 12.0000 [IU] | PEN_INJECTOR | Freq: Every day | SUBCUTANEOUS | 11 refills | Status: DC

## 2017-12-18 MED ORDER — INSULIN ASPART 100 UNIT/ML FLEXPEN: 5.0000 [IU] | PEN_INJECTOR | Freq: Three times a day (TID) | SUBCUTANEOUS | 11 refills | Status: DC

## 2017-12-18 MED ORDER — INSULIN PEN NEEDLE 32G X 4 MM MISC: 3 refills | Status: AC

## 2017-12-18 NOTE — Patient Instructions (Addendum)
-   STOP Tyler Aas  - START Lantus 12 units daily  - DECREASE Novolog to 5 units with each meal   - Bring your meter on next visit please

## 2017-12-18 NOTE — Progress Notes (Signed)
Name: Lisa [name]  MRN/ DOB: [id number], 16-Sep-1935   Age/ Sex: 82 y.o., female    PCP: Colonel Bald, MD   Reason for Endocrinology Evaluation: Type 2 Diabetes Mellitus     Date of Initial Endocrinology Visit: 12/18/2017     PATIENT IDENTIFIER: Lisa [name] is a 82 y.o. [place]  female with a past medical history of HTN,T2DM, CKD IV, CHF, A.Fib and Dyslipidemia. The patient presented for initial ndocrinology clinic visit on 12/18/2017 for consultative assistance with her diabetes management.   She is here with her husband and a serbian  interpretor   HPI: Ms. Brau was    Diagnosed with T2DM in 2001 Prior Medications tried: Metformin  Hypoglycemia episodes : Yes            Symptoms: sob              Frequency: she has 2 episodes of passing out with low BG in the past week  Hemoglobin A1c has ranged from 7.1% in 2017, peaking at 12.2% in 07/2015. Currently checking blood sugars 3-4 x / day,  Before meals.  Patient required assistance for hypoglycemia: 1 Patient has required hospitalization within the last 1 year from hyper or hypoglycemia: 0                                                                                        In terms of diet, the patient she avoids sugar-sweetened beverages , she does snack on fruits and milk.    Pt lives with her husband only , they have 2 sons and recently lost heir daughter in-law    The husband is the care giver and gives her insulin injections.  She was on Tresiba 20 units, subsequently changed to 40 units but a few days ago this was decreased to 30 units.  The episode of hypoglycemia this week , was at 1 AM     HOME DIABETES REGIMEN: Tresiba 30 units daily in AM  Novolog 10 untis TID QAC   Statin: Yes ACE-I/ARB: No    METER DOWNLOAD SUMMARY: did not bring       DIABETIC COMPLICATIONS: Microvascular complications:   CKD   Unknown retinoapthy , no neuropathy  Last eye exam: never    Macrovascular complications:    Denies: CAD, PVD, CVA   PAST HISTORY: Past Medical History:  Past Medical History:  Diagnosis Date  . Anemia   . Carotid arterial disease ([place])   . Chronic combined systolic and diastolic CHF (congestive heart failure) ([place])    a. 08/2014 Echo: EF 40-45%, no rwma, bicuspid AoV, mild AS, mod dil Asc Ao, PASP 50mmHg.  . CKD (chronic kidney disease), stage III ([place])   . Diabetes mellitus without complication ([place])   . Essential hypertension   . Mild aortic stenosis    a. 08/2014 Echo: mild AS, bicuspid AoV.  . OSA on CPAP   . Osteoarthritis of both ankles   . Persistent atrial fibrillation    a. Dx 08/2013, CHA2DS2VASc=5-->Xarelto.  . Vitamin D deficiency    Past Surgical History:  Past Surgical History:  Procedure Laterality Date  . CHOLECYSTECTOMY  Social History:  reports that she has quit smoking. She has never used smokeless tobacco. She reports that she does not drink alcohol or use drugs. Family History:  Family History  Problem Relation Age of Onset  . Diabetes Mother   . Hyperlipidemia Mother   . Hypertension Mother   . Stroke Sister   . Heart attack Neg Hx      HOME MEDICATIONS: Allergies as of 12/18/2017      Reactions   Lisinopril Cough      Medication List        Accurate as of 12/18/17 11:18 AM. Always use your most recent med list.          ACCU-CHEK GUIDE test strip Generic drug:  glucose blood USE TO CHECK FOUR TIMES DAILY AS DIRECTED   butalbital-acetaminophen-caffeine 50-325-40 MG tablet Commonly known as:  FIORICET, ESGIC Take 1 tablet by mouth every 6 (six) hours as needed for headache.   carvedilol 12.5 MG tablet Commonly known as:  COREG Take 1 tablet (12.5 mg total) by mouth 2 (two) times daily with a meal.   docusate sodium 100 MG capsule Commonly known as:  COLACE Take 1 capsule (100 mg total) by mouth daily.   furosemide 40 MG tablet Commonly known as:  LASIX Take 1 tablet (40 mg  total) by mouth 2 (two) times daily.   hydrALAZINE 25 MG tablet Commonly known as:  APRESOLINE Take 1.5 tablets (37.5 mg total) by mouth every 8 (eight) hours.   insulin aspart 100 UNIT/ML FlexPen Commonly known as:  NOVOLOG Inject 5 Units into the skin 3 (three) times daily with meals. Pt uses per sliding scale.   Insulin Glargine 100 UNIT/ML Solostar Pen Commonly known as:  LANTUS Inject 12 Units into the skin daily.   Insulin Pen Needle 32G X 4 MM Misc Four times daily   polyethylene glycol packet Commonly known as:  MIRALAX / GLYCOLAX Take 17 g by mouth daily.   Rivaroxaban 15 MG Tabs tablet Commonly known as:  XARELTO Take 1 tablet (15 mg total) by mouth daily with supper.   rosuvastatin 10 MG tablet Commonly known as:  CRESTOR Take 1 tablet (10 mg total) by mouth daily.        ALLERGIES: Allergies  Allergen Reactions  . Lisinopril Cough     REVIEW OF SYSTEMS: A comprehensive ROS was conducted with the patient and is negative except as per HPI and below:  Review of Systems  Constitutional: Positive for malaise/fatigue. Negative for weight loss.  HENT: Negative for congestion and sore throat.   Eyes: Negative for blurred vision and pain.  Respiratory: Positive for shortness of breath. Negative for cough.   Cardiovascular: Negative for chest pain and palpitations.  Gastrointestinal: Positive for constipation. Negative for nausea.  Genitourinary: Negative for frequency and hematuria.  Musculoskeletal: Negative for falls.  Skin: Negative for itching and rash.  Neurological: Negative for tingling and tremors.  Endo/Heme/Allergies: Negative for polydipsia.      OBJECTIVE:   VITAL SIGNS: BP 130/78   Pulse 90   Resp 18   Ht 5\' 1"  (1.549 m)   Wt 183 lb (83 kg)   SpO2 90%   BMI 34.58 kg/m    PHYSICAL EXAM:  General: Pt appears well and is in NAD  HEENT: Head: Unremarkable with good dentition. Oropharynx clear without exudate.  Eyes: External eye exam  normal without stare, lid lag or exophthalmos.  EOM intact.    Neck: General: Supple without adenopathy or  carotid bruits. Thyroid: Thyroid size normal.  No goiter or nodules appreciated. No thyroid bruit.  Lungs: Good  BS bilat with bilateral crackles at the base  Heart: RRR with normal S1 and S2 and no gallops;  Abdomen: Normoactive bowel sounds, soft, nontender, without masses or organomegaly palpable  Extremities:  Lower extremities - 1+ pretibial edema.   Skin: Normal texture and temperature to palpation. No rash noted. No Acanthosis nigricans/skin tags.   Neuro: MS is good with appropriate affect, pt is alert and oriented to place and time      DATA REVIEWED: 11/19/2017  A1c 11.3 %   K4.3 mEq/L Gluc 559 mg/dL  BUN 61 mg/dL Cr. 2.2 mg/dL  GFR 22.8 mL/min/1.73   LDL 88 mg/dL  TG 175 mg/dL  Vitamin D 36.7 ng/mL   TSH 2.228 uIU/mL   PTH  120.5 pg/mL    ASSESSMENT / PLAN / RECOMMENDATIONS:   1) Type 2 Diabetes Mellitus, poorly controlled, With renal complications - Most recent A1c of 11.3% %. Goal A1c < 7.5 %.     GENERAL:  Patient did not bring meter today, I explained to the pt and spouse that its difficult to make insulin adjustments without sugar data.  I have gathered from the history that she tends to have hypoglycemia during the day at times but more at night, since I don't have glucose data, I will decrease both basal/prandial insulin and see her again in 2 weeks with glucose data.   The priority at this time is to prevent  Hypoglycemia and once this is accomplished, will target hyperglycemia  I have advised them to take Novolog with meals only, if she is going to skip a meal, she needs to hold taking Novolog.   She was also advised that she needs to take basal insulin regardless of her eating habits.   I would like to stop [place] and switch it to Lantus, due to the long half life of [place] and CKD, she is at higher risk of hypoglycemia with improper  insulin dose.   As per interpretor there was miscommunication between patient and spouse,      MEDICATIONS:  STOP TRESIBA (she  Can take 15 unit until Lantus is delivered)   Start Lantus at 12 units daily  Decrease Novolog to 5 units with meals  EDUCATION / INSTRUCTIONS:  BG monitoring instructions: Patient is instructed to check her blood sugars 4 times a day, before meals and bedtime.  Call [place] [facility] if: BG persistently < 70 or > 300.  I reviewed the Rule of 15 for the treatment of hypoglycemia in detail with the patient. Literature supplied. This was written in native language by interpretor  2) Diabetic complications:   Eye: Unknown to have known diabetic retinopathy. Last eye exam was   Neuro/ Feet: Does not have known diabetic peripheral neuropathy.  Renal: Patient does have known baseline CKD. She is not on an ACEI/ARB at present.   3) Lipids: Patient is on a statin.    4) Hypertension: She is at goal of < 140/90 mmHg.   F/u in 2 weeks   60 minutes was spent with the patient, > 50% of the time was spent in counseling and education.     Signed electronically by: Mack Guise, MD  [facility]  [facility] [address] [place], [place] [address] Phone: [phone] FAX: [phone]   CC: Colonel [address], [place] [address]  Phone: [phone]  Fax: [phone]    Return to Endocrinology clinic as below: Future Appointments  Date Time Provider [place]  01/04/2018  9:10 AM Alaysia Lightle, Melanie Crazier, MD [facility] None

## 2017-12-22 ENCOUNTER — Telehealth: Payer: Self-pay

## 2017-12-22 NOTE — Telephone Encounter (Signed)
Pharmacy called to state they have changed the [facility] to [facility] due to insurance coverage.

## 2017-12-29 NOTE — Progress Notes (Deleted)
Name: Lisa [name]  Age/ Sex: 82 y.o., female   MRN/ DOB: [id number], [birth year]     PCP: Colonel Bald, MD   Reason for Endocrinology Evaluation: Type 2 Diabetes Mellitus  Initial Endocrine Consultative Visit: 12/18/17    PATIENT IDENTIFIER: [name] is a 82 y.o. [place]  female with a past medical history ofHTN,T2DM, CKD IV, CHF, A.Fib and Dyslipidemia . The patient has followed with Endocrinology clinic since 12/18/17 for consultative assistance with management of her diabetes.  DIABETIC HISTORY:  Lisa [name] was diagnosed with T2DM in 2001, She was on metformin but this was discontinued due to deteriorating renal function. She has been on started insulin therapy for years. Her hemoglobin A1c has ranged from 7.1% in 2017, peaking at 12.2% in 07/2015.  She lives with her husband, who is the care giver, he also gives her insulin injections.    SUBJECTIVE:   During the last visit (12/18/17): A1c 11.3% . She was having middle of the night hypoglycemia while on tresiba. We switched Lisa [name] to lantus but due to insurance issues, this was changed to basaglar.   Today (12/29/2017): Lisa [name]  She checks her blood sugars *** times daily, preprandial to breakfast and ***. The patient has *** had hypoglycemic episodes since the last clinic visit, which typically occur *** x / - most often occuring ***. The patient is *** symptomatic with these episodes, with symptoms of {symptoms; hypoglycemia:9084048}. Otherwise, the patient {HAS/HAS NOT:522402} required any recent emergency interventions for hypoglycemia and {HAS/HAS NOT:522402} had recent hospitalizations secondary to hyper or hypoglycemic episodes.    ROS: As per HPI and as detailed below: ROS    HOME DIABETES REGIMEN:  Basaglar 12 units daily  Novolog 5 units TID QAC   METER DOWNLOAD SUMMARY: Date range evaluated:  *** Fingerstick Blood Glucose Tests = *** Average Number Tests/Day = *** Overall Mean FS Glucose = *** Standard Deviation = ***  BG Ranges: Low = *** High = ***   Hypoglycemic Events/30 Days: BG < 50 = *** Episodes of symptomatic severe hypoglycemia = ***    HISTORY:  Past Medical History:  Past Medical History:  Diagnosis Date  . Anemia   . Carotid arterial disease ([place])   . Chronic combined systolic and diastolic CHF (congestive heart failure) ([place])    a. 08/2014 Echo: EF 40-45%, no rwma, bicuspid AoV, mild AS, mod dil Asc Ao, PASP 50mmHg.  . CKD (chronic kidney disease), stage III ([place])   . Diabetes mellitus without complication ([place])   . Essential hypertension   . Mild aortic stenosis    a. 08/2014 Echo: mild AS, bicuspid AoV.  . OSA on CPAP   . Osteoarthritis of both ankles   . Persistent atrial fibrillation    a. Dx 08/2013, CHA2DS2VASc=5-->Xarelto.  . Vitamin D deficiency     Past Surgical History:  Past Surgical History:  Procedure Laterality Date  . CHOLECYSTECTOMY       Social History:  reports that she has quit smoking. She has never used smokeless tobacco. She reports that she does not drink alcohol or use drugs. Family History:  Family History  Problem Relation Age of Onset  . Diabetes Mother   . Hyperlipidemia Mother   . Hypertension Mother   . Stroke Sister   . Heart attack Neg Hx       HOME MEDICATIONS: Allergies as of 01/04/2018      Reactions   Lisinopril Cough      Medication List  Accurate as of 12/29/17  1:05 PM. Always use your most recent med list.          ACCU-CHEK GUIDE test strip Generic drug:  glucose blood USE TO CHECK FOUR TIMES DAILY AS DIRECTED   butalbital-acetaminophen-caffeine 50-325-40 MG tablet Commonly known as:  FIORICET, ESGIC Take 1 tablet by mouth every 6 (six) hours as needed for headache.   carvedilol 12.5 MG tablet Commonly known as:  COREG Take 1 tablet (12.5 mg total) by mouth 2 (two)  times daily with a meal.   docusate sodium 100 MG capsule Commonly known as:  COLACE Take 1 capsule (100 mg total) by mouth daily.   furosemide 40 MG tablet Commonly known as:  LASIX Take 1 tablet (40 mg total) by mouth 2 (two) times daily.   hydrALAZINE 25 MG tablet Commonly known as:  APRESOLINE Take 1.5 tablets (37.5 mg total) by mouth every 8 (eight) hours.   insulin aspart 100 UNIT/ML FlexPen Commonly known as:  NOVOLOG Inject 5 Units into the skin 3 (three) times daily with meals. Pt uses per sliding scale.   Insulin Glargine 100 UNIT/ML Solostar Pen Commonly known as:  LANTUS Inject 12 Units into the skin daily.   Insulin Pen Needle 32G X 4 MM Misc Four times daily   polyethylene glycol packet Commonly known as:  MIRALAX / GLYCOLAX Take 17 g by mouth daily.   Rivaroxaban 15 MG Tabs tablet Commonly known as:  XARELTO Take 1 tablet (15 mg total) by mouth daily with supper.   rosuvastatin 10 MG tablet Commonly known as:  CRESTOR Take 1 tablet (10 mg total) by mouth daily.        OBJECTIVE:   Vital Signs: There were no vitals taken for this visit.  Wt Readings from Last 3 Encounters:  12/18/17 183 lb (83 kg)  09/24/15 162 lb (73.5 kg)  09/17/15 173 lb (78.5 kg)     Exam: General: Pt appears well and is in NAD  Lungs: Clear with good BS bilat with no rales, rhonchi, or wheezes  Heart: RRR with normal S1 and S2 and no gallops; no murmurs; no rub  Abdomen: Normoactive bowel sounds, soft, nontender, without masses or organomegaly palpable  Extremities: No pretibial edema. No tremor. Normal strength and motion throughout. See detailed diabetic foot exam below.  Neuro: MS is good with appropriate affect, pt is alert and Ox3      DATA REVIEWED:  Lab Results  Component Value Date   HGBA1C 7.1 (H) 10/25/2015   HGBA1C 8.3 (H) 09/18/2015   HGBA1C 12.2 08/02/2015   Lab Results  Component Value Date   LDLCALC 83 08/24/2014   CREATININE 1.42 (H)  11/13/2015    ASSESSMENT / PLAN / RECOMMENDATIONS:   1) Type 2 Diabetes Mellitus, poorly controlled, With renal complications - Most recent A1c of 11.3 %. Goal A1c < 7.0 %.    Plan:   MEDICATIONS:  ***  EDUCATION / INSTRUCTIONS:  BG monitoring instructions: Patient is instructed to check her blood sugars *** times a day, ***.  Call [place] [place] [facility] if: BG persistently < 70 or > 300. . I reviewed the Rule of 15 for the treatment of hypoglycemia in detail with the patient. Literature supplied.  F/U in ***    Signed electronically by: Mack Guise, MD  [facility]  [facility] [place], [place] [address] Phone: [phone] FAX: [phone]   CC: Colonel Bald, MD [phone]  [place] [address] Phone: [phone]  Fax: [phone]  Return to Endocrinology clinic as below: Future Appointments  Date Time Provider [place]  01/04/2018  9:10 AM Connor Foxworthy, Melanie Crazier, MD [facility] None

## 2018-01-04 ENCOUNTER — Ambulatory Visit: Payer: Medicare Other | Admitting: Internal Medicine

## 2018-01-04 DIAGNOSIS — Z0289 Encounter for other administrative examinations: Secondary | ICD-10-CM

## 2018-01-21 NOTE — Progress Notes (Signed)
Name: Lisa [name]  Age/ Sex: 82 y.o., female   MRN/ DOB: [id number], [birth year]     PCP: Colonel Bald, MD   Reason for Endocrinology Evaluation: Type 2 Diabetes Mellitus  Initial Endocrine Consultative Visit: 12/18/17    PATIENT IDENTIFIER: Lisa [name] is a 82 y.o. [place]  female with a past medical history ofHTN,T2DM, CKD IV, CHF, A.Fib and Dyslipidemia . The patient has followed with Endocrinology clinic since 12/18/17 for consultative assistance with management of her diabetes.  DIABETIC HISTORY:  Lisa [name] was diagnosed with T2DM in 2001, She was on metformin but this was discontinued due to deteriorating renal function. She has been on started insulin therapy for years. Her hemoglobin A1c has ranged from 7.1% in 2017, peaking at 12.2% in 07/2015.  She lives with her husband, who is the care giver, he also gives her insulin injections.    SUBJECTIVE:   During the last visit (12/18/17): A1c 11.3% . She was having middle of the night hypoglycemia while on tresiba. We switched Tyler Aas to lantus but due to insurance issues, this was changed to basaglar.   Today (01/22/2018): Lisa [name] is here for her 1 month follow up on her diabetes management.  She is here with the [place] interpreter and her husband, who is her main caretaker.  She checks her blood sugars 3-4 times daily, preprandial to breakfast and bedtime. The patient has had hypoglycemic episodes since the last clinic visit, which typically occur 1 x /week - most often occuring during the day , in the afternoon . The patient is symptomatic with these episodes. Otherwise, the patient has not required any recent emergency interventions for hypoglycemia and has  not had recent hospitalizations secondary to hyper or hypoglycemic episodes, but she was admitted on 11/23 for pulmonary edema.  Husband continues to use tresiba .   ROS: As per HPI and as detailed below: Review  of Systems  Constitutional: Positive for malaise/fatigue. Negative for weight loss.  Respiratory: Positive for shortness of breath. Negative for cough.   Cardiovascular: Negative for chest pain and leg swelling.  Gastrointestinal: Positive for abdominal pain. Negative for diarrhea and nausea.  Musculoskeletal: Negative for falls.      HOME DIABETES REGIMEN:  Tresiba  20 units daily  Novolog 20 units TID QAC   METER DOWNLOAD SUMMARY: Date range evaluated: 11/21-12/20/19 Fingerstick Blood Glucose Tests = 90 Average Number Tests/Day = 3.3 Overall Mean FS Glucose = 191 Standard Deviation = 82.1  BG Ranges: Low = 53 High = 452   Hypoglycemic Events/30 Days: BG < 50 = 0 Episodes of symptomatic severe hypoglycemia = 0    HISTORY:  Past Medical History:  Past Medical History:  Diagnosis Date  . Anemia   . Carotid arterial disease ([place])   . Chronic combined systolic and diastolic CHF (congestive heart failure) ([place])    a. 08/2014 Echo: EF 40-45%, no rwma, bicuspid AoV, mild AS, mod dil Asc Ao, PASP 37mmHg.  . CKD (chronic kidney disease), stage III ([place])   . Diabetes mellitus without complication ([place])   . Essential hypertension   . Mild aortic stenosis    a. 08/2014 Echo: mild AS, bicuspid AoV.  . OSA on CPAP   . Osteoarthritis of both ankles   . Persistent atrial fibrillation    a. Dx 08/2013, CHA2DS2VASc=5-->Xarelto.  . Vitamin D deficiency    Past Surgical History:  Past Surgical History:  Procedure Laterality Date  . CHOLECYSTECTOMY      Social  History:  reports that she has quit smoking. She has never used smokeless tobacco. She reports that she does not drink alcohol or use drugs. Family History:  Family History  Problem Relation Age of Onset  . Diabetes Mother   . Hyperlipidemia Mother   . Hypertension Mother   . Stroke Sister   . Heart attack Neg Hx      HOME MEDICATIONS: Allergies as of 01/22/2018      Reactions   Lisinopril Cough        Medication List       Accurate as of January 22, 2018  2:00 PM. Always use your most recent med list.        BASAGLAR KWIKPEN 100 UNIT/ML Sopn Inject 0.2 mLs (20 Units total) into the skin at bedtime.   butalbital-acetaminophen-caffeine 50-325-40 MG tablet Commonly known as:  FIORICET, ESGIC Take 1 tablet by mouth every 6 (six) hours as needed for headache.   carvedilol 12.5 MG tablet Commonly known as:  COREG Take 1 tablet (12.5 mg total) by mouth 2 (two) times daily with a meal.   docusate sodium 100 MG capsule Commonly known as:  COLACE Take 1 capsule (100 mg total) by mouth daily.   furosemide 40 MG tablet Commonly known as:  LASIX Take 1 tablet (40 mg total) by mouth 2 (two) times daily.   glucose blood test strip Commonly known as:  ACCU-CHEK GUIDE 1 each by Other route 4 (four) times daily. Use as instructed   hydrALAZINE 25 MG tablet Commonly known as:  APRESOLINE Take 1.5 tablets (37.5 mg total) by mouth every 8 (eight) hours.   insulin aspart 100 UNIT/ML FlexPen Commonly known as:  NOVOLOG FLEXPEN Inject 14 Units into the skin 3 (three) times daily with meals. Pt uses per sliding scale.   Insulin Pen Needle 32G X 4 MM Misc Commonly known as:  BD PEN NEEDLE NANO U/F Four times daily   polyethylene glycol packet Commonly known as:  MIRALAX / GLYCOLAX Take 17 g by mouth daily.   Rivaroxaban 15 MG Tabs tablet Commonly known as:  XARELTO Take 1 tablet (15 mg total) by mouth daily with supper.   rosuvastatin 10 MG tablet Commonly known as:  CRESTOR Take 1 tablet (10 mg total) by mouth daily.        OBJECTIVE:   Vital Signs: BP (!) 162/72 (BP Location: Right Arm, Patient Position: Sitting, Cuff Size: Normal)   Pulse 73   Ht 5\' 1"  (1.549 m)   Wt 176 lb 6.4 oz (80 kg)   SpO2 97%   BMI 33.33 kg/m   Wt Readings from Last 3 Encounters:  01/22/18 176 lb 6.4 oz (80 kg)  12/18/17 183 lb (83 kg)  09/24/15 162 lb (73.5 kg)     Exam: General: Pt  appears well and is in NAD  Lungs: Clear with good BS bilat with no rales, rhonchi, or wheezes  Heart: RRR with normal S1 and S2 and no gallops; no murmurs; no rub  Abdomen: Normoactive bowel sounds, soft, nontender, without masses or organomegaly palpable  Extremities: No pretibial edema. No tremor.  Neuro: MS is good with appropriate affect, pt is alert and Ox3      DATA REVIEWED:  Lab Results  Component Value Date   HGBA1C 7.1 (H) 10/25/2015   HGBA1C 8.3 (H) 09/18/2015   HGBA1C 12.2 08/02/2015   Lab Results  Component Value Date   LDLCALC 83 08/24/2014   CREATININE 1.42 (H) 11/13/2015  ASSESSMENT / PLAN / RECOMMENDATIONS:   1) Type 2 Diabetes Mellitus, poorly controlled, With renal complications - Most recent A1c of 11.3 %. Goal A1c < 7.0 %.    Plan: - Praised husband on frequent glucose checks - Pt with memory issues , and dependent on husband with glucose checks and insulin intake.  - In review of glucose meter download, for the most part, if she takes insulin, her glucose is good. There's are quite a few days, where its clear she did not receive any of her insulin .  - Husband also scared when her BG is tight to give her any insulin, which is usually followed by a high BG. I have advised him, in the future if he is scared of prandial insulin because of a good glucose reading, he may give her 50% of prandial dose.  - We also discussed that basal insulin, should be given regardless of the eating pattern.  - I also have advised him to stop [place], and switch to [employer], because tresiba has a longer action , and with the wrong dosing, the effect will last longer.  - Her hypoglycemia episodes , are mostly during the afternoon, due to insulin - CHO mismatch, husband states, pt does not always have an appetite to eat.    MEDICATIONS: Stop [employer] 20 units daily  Decrease Novolog to 16 units, if she is going to eat a small meal, give 8 units   EDUCATION /  INSTRUCTIONS:  BG monitoring instructions: Patient is instructed to check her blood sugars 4 times a day, before meals and bedtime.  Call [facility] if: BG persistently < 70 or > 300. . I reviewed the Rule of 15 for the treatment of hypoglycemia in detail with the patient. Literature supplied.  F/U in 8 weeks     Signed electronically by: Mack Guise, MD  [facility]  [facility] [address] [place], [place] [address] Phone: [phone] FAX: [phone]   CC: Colonel Bald, [place] [address] Phone: [phone]  Fax: [phone]  Return to Endocrinology clinic as below: Future Appointments  Date Time Provider [place]  03/17/2018  9:10 AM Peggyann Zwiefelhofer, Melanie Crazier, MD [facility] None

## 2018-01-22 ENCOUNTER — Encounter: Payer: Self-pay | Admitting: Internal Medicine

## 2018-01-22 ENCOUNTER — Ambulatory Visit (INDEPENDENT_AMBULATORY_CARE_PROVIDER_SITE_OTHER): Payer: Medicare Other | Admitting: Internal Medicine

## 2018-01-22 ENCOUNTER — Telehealth: Payer: Self-pay | Admitting: Internal Medicine

## 2018-01-22 VITALS — BP 162/72 | HR 73 | Ht 61.0 in | Wt 176.4 lb

## 2018-01-22 DIAGNOSIS — N184 Chronic kidney disease, stage 4 (severe): Secondary | ICD-10-CM

## 2018-01-22 DIAGNOSIS — E1122 Type 2 diabetes mellitus with diabetic chronic kidney disease: Secondary | ICD-10-CM

## 2018-01-22 DIAGNOSIS — Z794 Long term (current) use of insulin: Secondary | ICD-10-CM

## 2018-01-22 MED ORDER — BASAGLAR KWIKPEN 100 UNIT/ML ~~LOC~~ SOPN: 20.0000 [IU] | PEN_INJECTOR | Freq: Every day | SUBCUTANEOUS | 6 refills | Status: DC

## 2018-01-22 MED ORDER — INSULIN ASPART 100 UNIT/ML FLEXPEN: 14.0000 [IU] | PEN_INJECTOR | Freq: Three times a day (TID) | SUBCUTANEOUS | 11 refills | Status: DC

## 2018-01-22 MED ORDER — INSULIN ASPART 100 UNIT/ML FLEXPEN: 16.0000 [IU] | PEN_INJECTOR | Freq: Three times a day (TID) | SUBCUTANEOUS | 11 refills | Status: DC

## 2018-01-22 MED ORDER — GLUCOSE BLOOD VI STRP: 1.0000 | ORAL_STRIP | Freq: Four times a day (QID) | 11 refills | Status: DC

## 2018-01-22 NOTE — Telephone Encounter (Signed)
CMN faxed to [facility] [facility] for approval

## 2018-01-22 NOTE — Telephone Encounter (Signed)
Noted, CMN placed in your to sign slot.

## 2018-01-22 NOTE — Patient Instructions (Signed)
-   Stop tresiba Horticulturist, commercial at 20 units every night  - Novolog 16 units with each meal, if you are going to eat a small meal, take only 8 units     ???????? ??????? - ??????? ????????? ? 20 ???????? ????? ?????? - ??????? 16 ???????? ?? ????? ?????, ??? ???? ????? ???? ?????, ?????? ???? 8 ???????? - Zaustavi tresibu - [employer] u 20 jedinica svake ve?eri - Novolog 16 jedinica uz svaki obrok, ako c?ete jesti [place] obrok, uzmite samo 8 [employer]

## 2018-01-22 NOTE — Telephone Encounter (Signed)
[facility] called re: they sent over CMN  for test strips RX.

## 2018-03-17 ENCOUNTER — Ambulatory Visit: Payer: Medicare Other | Admitting: Internal Medicine

## 2018-03-17 NOTE — Progress Notes (Deleted)
Name: Lisa [name]  Age/ Sex: 83 y.o., female   MRN/ DOB: [id number], 1936/01/06     PCP: Colonel Bald, MD   Reason for Endocrinology Evaluation: Type 2 Diabetes Mellitus  Initial Endocrine Consultative Visit: 12/18/17    PATIENT IDENTIFIER: Lisa [name] is a 83 y.o. [place]  female with a past medical history ofHTN,T2DM, CKD IV, CHF, A.Fib and Dyslipidemia . The patient has followed with Endocrinology clinic since 12/18/17 for consultative assistance with management of her diabetes.  DIABETIC HISTORY:  Lisa [name] was diagnosed with T2DM in 2001, She was on metformin but this was discontinued due to deteriorating renal function. She has been on started insulin therapy for years. Her hemoglobin A1c has ranged from 7.1% in 2017, peaking at 12.2% in 07/2015.  She lives with her husband, who is the care giver, he also gives her insulin injections.   Lisa [name] was switched to Lisa [name] in 01/2018 due to higher risk of hypoglycemia.    SUBJECTIVE:   During the last visit (01/22/18): Her glucose readings were improving, there were instances when sugar is tight, husband will hold off on prandial which causes hyperglycemia by the next meal. She was having hypoglycemia in the afternoons. We reduced prandial insulin.   Today (01/22/2018): Lisa [name] is here for 6 weeks follow up on diabetes management.  She is here with the [place] interpreter and her husband, who is her main caretaker.  She checks her blood sugars 3-4 times daily, preprandial to breakfast and bedtime. The patient has had hypoglycemic episodes since the last clinic visit, which typically occur 1 x /week - most often occuring during the day , in the afternoon . The patient is symptomatic with these episodes. Otherwise, the patient has not required any recent emergency interventions for hypoglycemia and has  not had recent hospitalizations secondary to hyper or hypoglycemic  episodes, but she was admitted on 11/23 for pulmonary edema.  Husband continues to use tresiba .   ROS: As per HPI and as detailed below: Review of Systems  Constitutional: Positive for malaise/fatigue. Negative for weight loss.  Respiratory: Positive for shortness of breath. Negative for cough.   Cardiovascular: Negative for chest pain and leg swelling.  Gastrointestinal: Positive for abdominal pain. Negative for diarrhea and nausea.  Musculoskeletal: Negative for falls.      HOME DIABETES REGIMEN:  Tresiba  20 units daily  Novolog 16 units TID QAC with a regular meal and 8 units with small meal.    METER DOWNLOAD SUMMARY: Date range evaluated:  Fingerstick Blood Glucose Tests = 90 Average Number Tests/Day = 3.3 Overall Mean FS Glucose = 191 Standard Deviation = 82.1  BG Ranges: Low = 53 High = 452   Hypoglycemic Events/30 Days: BG < 50 = 0 Episodes of symptomatic severe hypoglycemia = 0    HISTORY:  Past Medical History:  Past Medical History:  Diagnosis Date  . Anemia   . Carotid arterial disease ([place])   . Chronic combined systolic and diastolic CHF (congestive heart failure) ([place])    a. 08/2014 Echo: EF 40-45%, no rwma, bicuspid AoV, mild AS, mod dil Asc Ao, PASP 27mmHg.  . CKD (chronic kidney disease), stage III ([place])   . Diabetes mellitus without complication ([place])   . Essential hypertension   . Mild aortic stenosis    a. 08/2014 Echo: mild AS, bicuspid AoV.  . OSA on CPAP   . Osteoarthritis of both ankles   . Persistent atrial fibrillation    a.  Dx 08/2013, CHA2DS2VASc=5-->Xarelto.  . Vitamin D deficiency    Past Surgical History:  Past Surgical History:  Procedure Laterality Date  . CHOLECYSTECTOMY      Social History:  reports that she has quit smoking. She has never used smokeless tobacco. She reports that she does not drink alcohol or use drugs. Family History:  Family History  Problem Relation Age of Onset  . Diabetes Mother   .  Hyperlipidemia Mother   . Hypertension Mother   . Stroke Sister   . Heart attack Neg Hx      HOME MEDICATIONS: Allergies as of 03/17/2018      Reactions   Lisinopril Cough      Medication List       Accurate as of March 17, 2018  8:39 AM. Always use your most recent med list.        BASAGLAR KWIKPEN 100 UNIT/ML Sopn Inject 0.2 mLs (20 Units total) into the skin at bedtime.   butalbital-acetaminophen-caffeine 50-325-40 MG tablet Commonly known as:  FIORICET, ESGIC Take 1 tablet by mouth every 6 (six) hours as needed for headache.   carvedilol 12.5 MG tablet Commonly known as:  COREG Take 1 tablet (12.5 mg total) by mouth 2 (two) times daily with a meal.   docusate sodium 100 MG capsule Commonly known as:  COLACE Take 1 capsule (100 mg total) by mouth daily.   furosemide 40 MG tablet Commonly known as:  LASIX Take 1 tablet (40 mg total) by mouth 2 (two) times daily.   glucose blood test strip Commonly known as:  ACCU-CHEK GUIDE 1 each by Other route 4 (four) times daily. Use as instructed   hydrALAZINE 25 MG tablet Commonly known as:  APRESOLINE Take 1.5 tablets (37.5 mg total) by mouth every 8 (eight) hours.   insulin aspart 100 UNIT/ML FlexPen Commonly known as:  NOVOLOG FLEXPEN Inject 16 Units into the skin 3 (three) times daily with meals. Pt uses per sliding scale.   Insulin Pen Needle 32G X 4 MM Misc Commonly known as:  BD PEN NEEDLE NANO U/F Four times daily   polyethylene glycol packet Commonly known as:  MIRALAX / GLYCOLAX Take 17 g by mouth daily.   Rivaroxaban 15 MG Tabs tablet Commonly known as:  XARELTO Take 1 tablet (15 mg total) by mouth daily with supper.   rosuvastatin 10 MG tablet Commonly known as:  CRESTOR Take 1 tablet (10 mg total) by mouth daily.        OBJECTIVE:   Vital Signs: There were no vitals taken for this visit.  Wt Readings from Last 3 Encounters:  01/22/18 176 lb 6.4 oz (80 kg)  12/18/17 183 lb (83 kg)    09/24/15 162 lb (73.5 kg)     Exam: General: Pt appears well and is in NAD  Lungs: Clear with good BS bilat with no rales, rhonchi, or wheezes  Heart: RRR with normal S1 and S2 and no gallops; no murmurs; no rub  Abdomen: Normoactive bowel sounds, soft, nontender, without masses or organomegaly palpable  Extremities: No pretibial edema. No tremor.  Neuro: MS is good with appropriate affect, pt is alert and Ox3      DATA REVIEWED: 11/19/2017  A1c 11.3 %   ASSESSMENT / PLAN / RECOMMENDATIONS:   1) Type 2 Diabetes Mellitus, poorly controlled, With renal complications - Most recent A1c of 11.3 %. Goal A1c < 7.0 %.    Plan: - Praised husband on frequent glucose checks - Pt  with memory issues , and dependent on husband with glucose checks and insulin intake.  - In review of glucose meter download, for the most part, if she takes insulin, her glucose is good. There's are quite a few days, where its clear she did not receive any of her insulin .  - Husband also scared when her BG is tight to give her any insulin, which is usually followed by a high BG. I have advised him, in the future if he is scared of prandial insulin because of a good glucose reading, he may give her 50% of prandial dose.  - We also discussed that basal insulin, should be given regardless of the eating pattern.  - I also have advised him to stop [place], and switch to [employer], because tresiba has a longer action , and with the wrong dosing, the effect will last longer.  - Her hypoglycemia episodes , are mostly during the afternoon, due to insulin - CHO mismatch, husband states, pt does not always have an appetite to eat.    MEDICATIONS: Stop [employer] 20 units daily  Decrease Novolog to 16 units, if she is going to eat a small meal, give 8 units   EDUCATION / INSTRUCTIONS:  BG monitoring instructions: Patient is instructed to check her blood sugars 4 times a day, before meals and  bedtime.  Call [facility] if: BG persistently < 70 or > 300. . I reviewed the Rule of 15 for the treatment of hypoglycemia in detail with the patient. Literature supplied.  F/U in 8 weeks     Signed electronically by: Mack Guise, MD  [facility] Endocrinology  [address] [place], [place] [address] Phone: [phone] FAX: [phone]   CC: Colonel Bald, [place] [address] Phone: [phone]  Fax: [phone]  Return to Endocrinology clinic as below: Future Appointments  Date Time Provider [facility]  03/17/2018  9:10 AM Darina Hartwell, Melanie Crazier, MD [facility] None

## 2018-03-24 ENCOUNTER — Encounter: Payer: Self-pay | Admitting: Internal Medicine

## 2018-03-24 ENCOUNTER — Ambulatory Visit (INDEPENDENT_AMBULATORY_CARE_PROVIDER_SITE_OTHER): Payer: Medicare Other | Admitting: Internal Medicine

## 2018-03-24 VITALS — BP 136/84 | HR 88 | Resp 16 | Ht 61.0 in | Wt 181.4 lb

## 2018-03-24 DIAGNOSIS — N184 Chronic kidney disease, stage 4 (severe): Secondary | ICD-10-CM

## 2018-03-24 DIAGNOSIS — Z794 Long term (current) use of insulin: Secondary | ICD-10-CM

## 2018-03-24 DIAGNOSIS — E1122 Type 2 diabetes mellitus with diabetic chronic kidney disease: Secondary | ICD-10-CM

## 2018-03-24 MED ORDER — INSULIN ASPART 100 UNIT/ML FLEXPEN: 18.0000 [IU] | PEN_INJECTOR | Freq: Three times a day (TID) | SUBCUTANEOUS | 11 refills | Status: DC

## 2018-03-24 MED ORDER — BASAGLAR KWIKPEN 100 UNIT/ML ~~LOC~~ SOPN: 22.0000 [IU] | PEN_INJECTOR | Freq: Every day | SUBCUTANEOUS | 6 refills | Status: DC

## 2018-03-24 MED ORDER — GLUCOSE BLOOD VI STRP: 1.0000 | ORAL_STRIP | Freq: Four times a day (QID) | 3 refills | Status: DC

## 2018-03-24 NOTE — Progress Notes (Signed)
Name: Lisa [name]  Age/ Sex: 83 y.o., female   MRN/ DOB: [id number], 1935/03/20     PCP: Colonel Bald, MD   Reason for Endocrinology Evaluation: Type 2 Diabetes Mellitus  Initial Endocrine Consultative Visit: 12/18/17    PATIENT IDENTIFIER: [name] is a 83 y.o. [place]  female with a past medical history ofHTN,T2DM, CKD IV, CHF, A.Fib and Dyslipidemia . The patient has followed with Endocrinology clinic since 12/18/17 for consultative assistance with management of her diabetes.  DIABETIC HISTORY:  [name] was diagnosed with T2DM in 2001, She was on metformin but this was discontinued due to deteriorating renal function. She has been on started insulin therapy for years. Her hemoglobin A1c has ranged from 7.1% in 2017, peaking at 12.2% in 07/2015.  She lives with her husband, who is the care giver, he also gives her insulin injections.   Tyler Aas was switched to [place] in 01/2018 due to higher risk of hypoglycemia.    SUBJECTIVE:   During the last visit (01/22/18): Her glucose readings were improving, there were instances when sugar is tight, husband will hold off on prandial which causes hyperglycemia by the next meal. She was having hypoglycemia in the afternoons. We reduced prandial insulin.   Today (01/22/2018): [name] is here for 6 weeks follow up on diabetes management.  She is here with the [place] interpreter and her husband, who is her main caretaker.  She checks her blood sugars 3-4 times daily, preprandial  and bedtime. The patient has had hypoglycemic episodes since the last clinic visit, which typically occur 1 x /month - most often occuring during the day , in the afternoon . The patient is symptomatic with these episodes. Otherwise, the patient has not required any recent emergency interventions for hypoglycemia and has  not had recent hospitalizations secondary to hyper or hypoglycemic  episodes.   Husband assures me of compliance with taking basal/prandial insulin and he also shows me that he gives her the prandial insulin prior to eating.  ROS: As per HPI and as detailed below: Review of Systems  Constitutional: Negative for weight loss.  Respiratory: Negative for cough and shortness of breath.   Cardiovascular: Negative for chest pain and leg swelling.  Gastrointestinal: Negative for abdominal pain, diarrhea and nausea.      HOME DIABETES REGIMEN:  Basaglar 20 units daily  Novolog 16 units TID QAC with a regular meal and 8 units with small meal.    METER DOWNLOAD SUMMARY: Date range evaluated: 1/21-2/19/20 Fingerstick Blood Glucose Tests = 97 Average Number Tests/Day = 3.2 Overall Mean FS Glucose = 261 Standard Deviation = 93.8  BG Ranges: Low = 52 High = 556   Hypoglycemic Events/30 Days: BG < 50 = 0 Episodes of symptomatic severe hypoglycemia = 0    HISTORY:  Past Medical History:  Past Medical History:  Diagnosis Date  . Anemia   . Carotid arterial disease ([place])   . Chronic combined systolic and diastolic CHF (congestive heart failure) ([place])    a. 08/2014 Echo: EF 40-45%, no rwma, bicuspid AoV, mild AS, mod dil Asc Ao, PASP 60mmHg.  . CKD (chronic kidney disease), stage III ([place])   . Diabetes mellitus without complication ([place])   . Essential hypertension   . Mild aortic stenosis    a. 08/2014 Echo: mild AS, bicuspid AoV.  . OSA on CPAP   . Osteoarthritis of both ankles   . Persistent atrial fibrillation    a. Dx 08/2013, CHA2DS2VASc=5-->Xarelto.  [place]  Vitamin D deficiency    Past Surgical History:  Past Surgical History:  Procedure Laterality Date  . CHOLECYSTECTOMY      Social History:  reports that she has quit smoking. She has never used smokeless tobacco. She reports that she does not drink alcohol or use drugs. Family History:  Family History  Problem Relation Age of Onset  . Diabetes Mother   . Hyperlipidemia Mother   .  Hypertension Mother   . Stroke Sister   . Heart attack Neg Hx      HOME MEDICATIONS: Allergies as of 03/24/2018      Reactions   Lisinopril Cough      Medication List       Accurate as of March 24, 2018 10:41 AM. Always use your most recent med list.        BASAGLAR KWIKPEN 100 UNIT/ML Sopn Inject 0.2 mLs (20 Units total) into the skin at bedtime.   butalbital-acetaminophen-caffeine 50-325-40 MG tablet Commonly known as:  FIORICET, ESGIC Take 1 tablet by mouth every 6 (six) hours as needed for headache.   carvedilol 12.5 MG tablet Commonly known as:  COREG Take 1 tablet (12.5 mg total) by mouth 2 (two) times daily with a meal.   docusate sodium 100 MG capsule Commonly known as:  COLACE Take 1 capsule (100 mg total) by mouth daily.   furosemide 40 MG tablet Commonly known as:  LASIX Take 1 tablet (40 mg total) by mouth 2 (two) times daily.   glucose blood test strip Commonly known as:  ACCU-CHEK GUIDE 1 each by Other route 4 (four) times daily. Use as instructed   hydrALAZINE 25 MG tablet Commonly known as:  APRESOLINE Take 1.5 tablets (37.5 mg total) by mouth every 8 (eight) hours.   insulin aspart 100 UNIT/ML FlexPen Commonly known as:  NOVOLOG FLEXPEN Inject 16 Units into the skin 3 (three) times daily with meals. Pt uses per sliding scale.   Insulin Pen Needle 32G X 4 MM Misc Commonly known as:  BD PEN NEEDLE NANO U/F Four times daily   polyethylene glycol packet Commonly known as:  MIRALAX / GLYCOLAX Take 17 g by mouth daily.   Rivaroxaban 15 MG Tabs tablet Commonly known as:  XARELTO Take 1 tablet (15 mg total) by mouth daily with supper.   rosuvastatin 10 MG tablet Commonly known as:  CRESTOR Take 1 tablet (10 mg total) by mouth daily.        OBJECTIVE:   Vital Signs: BP 136/84 (BP Location: Right Arm, Patient Position: Sitting, Cuff Size: Large)   Pulse 88   Resp 16   Ht 5\' 1"  (1.549 m)   Wt 181 lb 6.4 oz (82.3 kg)   SpO2 95%    BMI 34.28 kg/m   Wt Readings from Last 3 Encounters:  03/24/18 181 lb 6.4 oz (82.3 kg)  01/22/18 176 lb 6.4 oz (80 kg)  12/18/17 183 lb (83 kg)     Exam: General: Pt appears well and is in NAD  Lungs: Clear with good BS bilat with no rales, rhonchi, or wheezes  Heart: RRR with normal S1 and S2 and no gallops; no murmurs; no rub  Abdomen: Normoactive bowel sounds, soft, nontender, without masses or organomegaly palpable  Extremities: No pretibial edema. No tremor.  Neuro: MS is good with appropriate affect, pt is alert and Ox3      DATA REVIEWED: 11/19/2017  A1c 11.3 %   ASSESSMENT / PLAN / RECOMMENDATIONS:   1) Type  2 Diabetes Mellitus, poorly controlled, With renal complications - Most recent A1c of 11.3 %. Goal A1c < 7.0 %.    Plan: -Mrs.Lisa [name] is much more alert today, and much more communicative, it seems like she is back to her baseline, which also indicates that her appetite has improved hence the elevated glucose readings on her meter download today. -We have discussed again the importance of taking NovoLog with a meal and not after a meal to prevent hypoglycemia. -Her glucose readings has been variable due to insulin-carbohydrate mismatch, I have offered her to see our CDE but the patient declined. -Has been encouraged to contact us with extremely high glucose over 250 or persistent hypoglycemia with BG is less than 70 mg/dL.   MEDICATIONS: Increase Basaglar to 22 units daily  Increase Novolog to 18 units.  EDUCATION / INSTRUCTIONS:  BG monitoring instructions: Patient is instructed to check her blood sugars 4 times a day, before meals and bedtime.  Call [facility] if: BG persistently < 70 or > 300. . I reviewed the Rule of 15 for the treatment of hypoglycemia in detail with the patient. Literature supplied.  F/U in 12weeks     Signed electronically by: Mack Guise, MD  [facility] Endocrinology  [address] [place], [place] [address] Phone: [phone] FAX: [phone]   CC: Colonel Bald, [place] [address] Phone: [phone]  Fax: [phone]  Return to Endocrinology clinic as below: Future Appointments  Date Time Provider [place]  03/24/2018 10:50 AM , Melanie Crazier, MD [facility] None

## 2018-03-24 NOTE — Patient Instructions (Addendum)
-   Increase Basaglar to 22 units ONce daily  - Increase Novolog to 18 units with each meal    -??????? ???????? ?? 22 ???????? ?????? ??????  -????????? ???? ?????????? ?? 18 ???????? ????? ?????

## 2018-03-30 ENCOUNTER — Telehealth: Payer: Self-pay | Admitting: Internal Medicine

## 2018-03-30 NOTE — Telephone Encounter (Signed)
Called pt's number listed and the person that picked up was pt's son and the number listed was apparently belonged to the son, and he was unaware of what medication the pt was confused about. Pt's son requested that I call pt's PCP and find out, but son did not understand when I told him that calling the PCP will not help identify what medication the pt is confused about.

## 2018-03-30 NOTE — Telephone Encounter (Signed)
Attempted to call pt and received no answer. Was unable to leave voicemail.

## 2018-03-30 NOTE — Telephone Encounter (Signed)
Patient called [facility] requesting a call back from the office, She is needing clarification on a medication dosage, the directions were not clear.  Please advise  Patient did not give the name of the medication when speaking to [facility]

## 2018-06-22 ENCOUNTER — Ambulatory Visit: Payer: Medicare Other | Admitting: Internal Medicine

## 2018-11-19 ENCOUNTER — Other Ambulatory Visit: Payer: Self-pay | Admitting: Internal Medicine

## 2018-12-16 ENCOUNTER — Other Ambulatory Visit: Payer: Self-pay

## 2018-12-16 MED ORDER — ACCU-CHEK GUIDE VI STRP: 1.0000 | ORAL_STRIP | Freq: Four times a day (QID) | 0 refills | Status: DC

## 2019-04-11 ENCOUNTER — Ambulatory Visit (INDEPENDENT_AMBULATORY_CARE_PROVIDER_SITE_OTHER): Payer: Medicare Other | Admitting: Internal Medicine

## 2019-04-11 ENCOUNTER — Other Ambulatory Visit: Payer: Self-pay

## 2019-04-11 VITALS — BP 136/78 | HR 74 | Temp 98.7°F | Ht 61.0 in | Wt 176.2 lb

## 2019-04-11 DIAGNOSIS — N184 Chronic kidney disease, stage 4 (severe): Secondary | ICD-10-CM | POA: Diagnosis not present

## 2019-04-11 DIAGNOSIS — E1122 Type 2 diabetes mellitus with diabetic chronic kidney disease: Secondary | ICD-10-CM | POA: Diagnosis not present

## 2019-04-11 DIAGNOSIS — Z794 Long term (current) use of insulin: Secondary | ICD-10-CM

## 2019-04-11 LAB — POCT GLYCOSYLATED HEMOGLOBIN (HGB A1C): Hemoglobin A1C: 8.7 % — AB (ref 4.0–5.6)

## 2019-04-11 MED ORDER — ACCU-CHEK GUIDE VI STRP: 1.0000 | ORAL_STRIP | Freq: Four times a day (QID) | 3 refills | Status: AC

## 2019-04-11 MED ORDER — NOVOLOG FLEXPEN 100 UNIT/ML ~~LOC~~ SOPN: 12.0000 [IU] | PEN_INJECTOR | Freq: Three times a day (TID) | SUBCUTANEOUS | 3 refills | Status: AC

## 2019-04-11 MED ORDER — TRESIBA FLEXTOUCH 100 UNIT/ML ~~LOC~~ SOPN: 30.0000 [IU] | PEN_INJECTOR | Freq: Every day | SUBCUTANEOUS | 3 refills | Status: DC

## 2019-04-11 NOTE — Progress Notes (Signed)
Name: Lisa [name]  Age/ Sex: 84 y.o., female   MRN/ DOB: [id number], 01-Sep-1935     PCP: Colonel Bald, MD   Reason for Endocrinology Evaluation: Type 2 Diabetes Mellitus  Initial Endocrine Consultative Visit: 12/18/17    PATIENT IDENTIFIER: Lisa [name] [name] a 84 y.o. [place]  female with a past medical history ofHTN,T2DM, CKD IV, CHF, A.Fib and Dyslipidemia . The patient has followed with Endocrinology clinic since 12/18/17 for consultative assistance with management of her diabetes.  DIABETIC HISTORY:  Lisa [name] was diagnosed with T2DM in 2001, She was on metformin but this was discontinued due to deteriorating renal function. She has been on started insulin therapy for years. Her hemoglobin A1c has ranged from 7.1% in 2017, peaking at 12.2% in 07/2015.  She lives with her husband, who [name] the care giver, he also gives her insulin injections.   Lisa [name] was switched to [place] in 01/2018 due to higher risk of hypoglycemia.    SUBJECTIVE:   During the last visit (03/24/2018): A1c 11.3% . We increased MDI regimen.     Today (01/22/2018): Lisa [name] here for a follow up on diabetes management.  She [name] here with her husband, who [name] her main caretaker, we used the interpreter line. She has not been here in over a year. She checks her blood sugars 3-4 times daily, preprandial  and bedtime. The patient has not had hypoglycemic episodes. Otherwise, the patient has not required any recent emergency interventions for hypoglycemia and has  not had recent hospitalizations secondary to hyper or hypoglycemic episodes.   ROS: As per HPI and as detailed below: Review of Systems  Constitutional: Negative for weight loss.  Respiratory: Positive for cough and shortness of breath.   Cardiovascular: Positive for leg swelling. Negative for chest pain.      HOME DIABETES REGIMEN:  Lisa [name]  20 units daily - husband states he has been  giving her 20 units TID ? Novolog 18 units TID QAC - Not taking      METER DOWNLOAD SUMMARY: Date range evaluated: 2/23-04/11/2019 Fingerstick Blood Glucose Tests = 97 Average Number Tests/Day = 3.2 Overall Mean FS Glucose = 261 Standard Deviation = 93.8  BG Ranges: Low = 52 High = 556   Hypoglycemic Events/30 Days: BG < 50 = 0 Episodes of symptomatic severe hypoglycemia = 0    HISTORY:  Past Medical History:  Past Medical History:  Diagnosis Date  . Anemia   . Carotid arterial disease ([place])   . Chronic combined systolic and diastolic CHF (congestive heart failure) ([place])    a. 08/2014 Echo: EF 40-45%, no rwma, bicuspid AoV, mild AS, mod dil Asc Ao, PASP 11mmHg.  . CKD (chronic kidney disease), stage III ([place])   . Diabetes mellitus without complication ([place])   . Essential hypertension   . Mild aortic stenosis    a. 08/2014 Echo: mild AS, bicuspid AoV.  . OSA on CPAP   . Osteoarthritis of both ankles   . Persistent atrial fibrillation    a. Dx 08/2013, CHA2DS2VASc=5-->Xarelto.  . Vitamin D deficiency    Past Surgical History:  Past Surgical History:  Procedure Laterality Date  . CHOLECYSTECTOMY      Social History:  reports that she has quit smoking. She has never used smokeless tobacco. She reports that she does not drink alcohol or use drugs. Family History:  Family History  Problem Relation Age of Onset  . Diabetes Mother   . Hyperlipidemia Mother   .  Hypertension Mother   . Stroke Sister   . Heart attack Neg Hx      HOME MEDICATIONS: Allergies as of 04/11/2019      Reactions   Lisinopril Cough      Medication List       Accurate as of April 11, 2019 10:14 AM. If you have any questions, ask your nurse or doctor.        STOP taking these medications   Basaglar KwikPen 100 UNIT/ML Stopped by: Dorita Sciara, MD     TAKE these medications   Accu-Chek Guide test strip Generic drug: glucose blood 1 each by Other route 4 (four) times daily.  Use as instructed   butalbital-acetaminophen-caffeine 50-325-40 MG tablet Commonly known as: FIORICET Take 1 tablet by mouth every 6 (six) hours as needed for headache.   carvedilol 12.5 MG tablet Commonly known as: COREG Take 1 tablet (12.5 mg total) by mouth 2 (two) times daily with a meal.   docusate sodium 100 MG capsule Commonly known as: COLACE Take 1 capsule (100 mg total) by mouth daily.   furosemide 40 MG tablet Commonly known as: LASIX Take 1 tablet (40 mg total) by mouth 2 (two) times daily.   hydrALAZINE 25 MG tablet Commonly known as: APRESOLINE Take 1.5 tablets (37.5 mg total) by mouth every 8 (eight) hours.   insulin aspart 100 UNIT/ML FlexPen Commonly known as: NovoLOG FlexPen Inject 18 Units into the skin 3 (three) times daily with meals. Pt uses per sliding scale.   Insulin Pen Needle 32G X 4 MM Misc Commonly known as: BD Pen Needle Nano U/F Four times daily   polyethylene glycol 17 g packet Commonly known as: MIRALAX / GLYCOLAX Take 17 g by mouth daily.   Rivaroxaban 15 MG Tabs tablet Commonly known as: XARELTO Take 1 tablet (15 mg total) by mouth daily with supper.   rosuvastatin 10 MG tablet Commonly known as: CRESTOR Take 1 tablet (10 mg total) by mouth daily.   Lisa [name] 100 UNIT/ML [name] Pen Generic drug: insulin degludec Inject 20 Units into the skin at bedtime. 3 times a day        OBJECTIVE:   Vital Signs: BP 136/78 (BP Location: Right Arm, Patient Position: Sitting, Cuff Size: Normal)   Pulse 74   Temp 98.7 F (37.1 C)   Ht 5\' 1"  (1.549 m)   Wt 176 lb 3.2 oz (79.9 kg)   SpO2 98%   BMI 33.29 kg/m   Wt Readings from Last 3 Encounters:  04/11/19 176 lb 3.2 oz (79.9 kg)  03/24/18 181 lb 6.4 oz (82.3 kg)  01/22/18 176 lb 6.4 oz (80 kg)     Exam: General: Pt appears well and [name] in NAD  Lungs: Fine inspiratory crackles at the base  Heart: RRR with normal S1 and S2 and no gallops; no murmurs; no rub  Extremities:  1+ pretibial edema.   Neuro: MS [name] good with appropriate affect, pt [name] alert and Ox3      DATA REVIEWED:   ASSESSMENT / PLAN / RECOMMENDATIONS:   1) Type 2 Diabetes Mellitus, poorly controlled, With CKD IV  Most recent A1c of 8.7 %. Goal A1c < 7.0 %.    - Difficult situation with Pt and caregiver have advanced age, live alone and with language barriers.  - Husband has not been giving pt Novolog, but has been giving her tresiba three times daily, he [name] under the impression a provider advised him with this. We  discussed Lisa [name] [name] a long acting insulin, with a long half life and should only be taken once daily.  - Her BG's fluctuate between 400 and 120's mg/dL which put the pt at risk for hypoglycemia.  - Will make the following changes - I am going to consult home care to see if they can help with medication management.   MEDICATIONS: Tresiba  30 units daily  Novolog 12 units.  EDUCATION / INSTRUCTIONS:  BG monitoring instructions: Patient [name] instructed to check her blood sugars 4 times a day, before meals and bedtime.  Call [facility] if: BG persistently < 70 or > 300. . I reviewed the Rule of 15 for the treatment of hypoglycemia in detail with the patient. Literature supplied.  F/U in 6 months      Signed electronically by: Mack Guise, MD  [facility] Endocrinology  [facility] [address] [place], [place] [address] Phone: [phone] FAX: [phone]   CC: Colonel Bald, [place] [address] Phone: [phone]  Fax: [phone]  Return to Endocrinology clinic as below: Future Appointments  Date Time Provider Caroleen  10/12/2019  9:30 AM , Melanie Crazier, MD [facility] None

## 2019-04-11 NOTE — Patient Instructions (Addendum)
 ???????   30 ???????? ?????? - ??????? 12 ???????? ?? ????? ?????    ???? ???????? ????? ????? ?????? (????? ? ???? ???? ?? 70 ?? / ??) ???????????? ?? ??????? 15 ?? ?????? ????????????? (???? ?? ??? (????? ? ???? ???? ?? 70 ?? / ??)  1. ?????: ?????? 15 ????? ??????? ??????? ???? ??? ?? ????? ?????, ??? ????????: 3-4 ???????? ??????? ??? 3-4 ?? ???? ??? ??????? ???? ??? ????? ?????? ????????? ????  ????? 2: ????????? ????? ? ???? ?? 15 ?????? ????? 3: ??? ??? ?? ????? ????? ??? 15-???????? ???????? ??????? -> ???? ?? ??????? ?? ????? 1 ? ???? ?????????? ?? ??? 15 ????? ??????? ???????.       - Tresiba 30 units daily  - Novolog 12 units with each meal      HOW TO TREAT LOW BLOOD SUGARS (Blood sugar LESS THAN 70 MG/DL)  Please follow the RULE OF 15 for the treatment of hypoglycemia treatment (when your (blood sugars are less than 70 mg/dL)    STEP 1: Take 15 grams of carbohydrates when your blood sugar is low, which includes:   3-4 GLUCOSE TABS  OR  3-4 OZ OF JUICE OR REGULAR SODA OR  ONE TUBE OF GLUCOSE GEL     STEP 2: RECHECK blood sugar in 15 MINUTES STEP 3: If your blood sugar is still low at the 15 minute recheck --> then, go back to STEP 1 and treat AGAIN with another 15 grams of carbohydrates.

## 2019-04-14 ENCOUNTER — Telehealth: Payer: Self-pay | Admitting: Internal Medicine

## 2019-04-14 ENCOUNTER — Other Ambulatory Visit: Payer: Self-pay | Admitting: Internal Medicine

## 2019-04-14 MED ORDER — TRESIBA FLEXTOUCH 100 UNIT/ML ~~LOC~~ SOPN: 30.0000 [IU] | PEN_INJECTOR | Freq: Every day | SUBCUTANEOUS | 3 refills | Status: DC

## 2019-04-14 NOTE — Telephone Encounter (Signed)
[facility] is calling requesting another RX for tresiba be sent with clear instructions. They are saying they have two different sets of directions on the prescription.   [facility] - [address], [place] Phone:  [phone]  Fax:  [phone]

## 2019-04-14 NOTE — Telephone Encounter (Signed)
Please clarify instructions.

## 2019-04-18 ENCOUNTER — Telehealth: Payer: Self-pay | Admitting: Internal Medicine

## 2019-04-18 NOTE — Telephone Encounter (Signed)
Heather with [facility] ph# [phone] requests to be called re: Orders for [place] for Patient.

## 2019-04-19 NOTE — Telephone Encounter (Signed)
Lisa [name] wanted to ensure that this office would be signing orders related to home health.

## 2019-04-19 NOTE — Telephone Encounter (Signed)
Lft vm to return call

## 2019-04-20 ENCOUNTER — Other Ambulatory Visit: Payer: Self-pay

## 2019-04-20 ENCOUNTER — Telehealth: Payer: Self-pay | Admitting: Internal Medicine

## 2019-04-20 MED ORDER — TRESIBA FLEXTOUCH 100 UNIT/ML ~~LOC~~ SOPN: 30.0000 [IU] | PEN_INJECTOR | Freq: Every day | SUBCUTANEOUS | 3 refills | Status: AC

## 2019-04-20 NOTE — Telephone Encounter (Signed)
sent 

## 2019-04-20 NOTE — Telephone Encounter (Signed)
[facility] [place], [place] Phone:  [phone]  Fax:  [phone]     Patient's daughter called asking if we could please re send the tresiba to [facility] in [place] and would like all future RX's to be called into this pharmacy.

## 2019-04-26 ENCOUNTER — Telehealth: Payer: Self-pay | Admitting: Internal Medicine

## 2019-04-26 NOTE — Telephone Encounter (Signed)
Spoke to [place] from [place] and she stated that she was following dosing that was with the referral sent, gave verbal for correct doses.

## 2019-04-26 NOTE — Telephone Encounter (Signed)
Lisa [name],    Please reach out to [name] [phone] ) Please clarify the following  1. Thank  Them so much for going there so quickly.  2. The insulin doses she has in her notes, are incorrect. Last time my instructions say 30 units Tresiba ONCE daily and 12 Units of Novolog WITH each meal.     Thanks   Lisa [name] Nena Jordan, MD  [facility] Endocrinology  [facility] [address] [name], [place] [address] Phone: [phone] FAX: [phone]

## 2019-07-28 ENCOUNTER — Telehealth: Payer: Self-pay | Admitting: Internal Medicine

## 2019-07-28 NOTE — Telephone Encounter (Signed)
Called patient to offer them the [facility] location for their appointment in August. When I called, a man answered saying "hello" but then I could not hear anything else after I stated where I was calling from (I said hello about 4 times), so I disconnected and called right back (with an interpreter), a man answered again but I am unsure as to who it was. I offered (through interpreter) the patient to see Dr [facility] in [facility] (since the patient lives there) and the interpreter said the man asked If I called a few minutes ago, I told interpreter "yes but I could not hear anything" - then interpreter stated that the man was cursing in his language and we were unsure as to why. Man disconnected the phone. Appointment is cancelled due to Dr not being in this office that day, but they are welcome to reschedule to [facility] if they call back.

## 2019-10-12 ENCOUNTER — Ambulatory Visit: Payer: Medicare Other | Admitting: Internal Medicine

## 2020-02-03 ENCOUNTER — Inpatient Hospital Stay (HOSPITAL_COMMUNITY)
Admission: EM | Admit: 2020-02-03 | Discharge: 2020-02-14 | DRG: 871 | Disposition: A | Discharge status: Hospice | Payer: Medicare Other | Attending: Internal Medicine | Admitting: Internal Medicine

## 2020-02-03 ENCOUNTER — Inpatient Hospital Stay (HOSPITAL_COMMUNITY): Payer: Medicare Other

## 2020-02-03 ENCOUNTER — Emergency Department (HOSPITAL_COMMUNITY): Payer: Medicare Other

## 2020-02-03 DIAGNOSIS — M7989 Other specified soft tissue disorders: Secondary | ICD-10-CM | POA: Diagnosis not present

## 2020-02-03 DIAGNOSIS — L03116 Cellulitis of left lower limb: Secondary | ICD-10-CM | POA: Diagnosis present

## 2020-02-03 DIAGNOSIS — I10 Essential (primary) hypertension: Secondary | ICD-10-CM | POA: Diagnosis not present

## 2020-02-03 DIAGNOSIS — I5043 Acute on chronic combined systolic (congestive) and diastolic (congestive) heart failure: Secondary | ICD-10-CM | POA: Diagnosis present

## 2020-02-03 DIAGNOSIS — I272 Pulmonary hypertension, unspecified: Secondary | ICD-10-CM | POA: Diagnosis present

## 2020-02-03 DIAGNOSIS — Z79899 Other long term (current) drug therapy: Secondary | ICD-10-CM

## 2020-02-03 DIAGNOSIS — R778 Other specified abnormalities of plasma proteins: Secondary | ICD-10-CM

## 2020-02-03 DIAGNOSIS — I5023 Acute on chronic systolic (congestive) heart failure: Secondary | ICD-10-CM | POA: Diagnosis not present

## 2020-02-03 DIAGNOSIS — Z87891 Personal history of nicotine dependence: Secondary | ICD-10-CM

## 2020-02-03 DIAGNOSIS — Z833 Family history of diabetes mellitus: Secondary | ICD-10-CM

## 2020-02-03 DIAGNOSIS — Z6827 Body mass index (BMI) 27.0-27.9, adult: Secondary | ICD-10-CM | POA: Diagnosis not present

## 2020-02-03 DIAGNOSIS — I130 Hypertensive heart and chronic kidney disease with heart failure and stage 1 through stage 4 chronic kidney disease, or unspecified chronic kidney disease: Secondary | ICD-10-CM | POA: Diagnosis present

## 2020-02-03 DIAGNOSIS — G4733 Obstructive sleep apnea (adult) (pediatric): Secondary | ICD-10-CM | POA: Diagnosis present

## 2020-02-03 DIAGNOSIS — N184 Chronic kidney disease, stage 4 (severe): Secondary | ICD-10-CM | POA: Diagnosis present

## 2020-02-03 DIAGNOSIS — Z66 Do not resuscitate: Secondary | ICD-10-CM | POA: Diagnosis present

## 2020-02-03 DIAGNOSIS — Z794 Long term (current) use of insulin: Secondary | ICD-10-CM

## 2020-02-03 DIAGNOSIS — M79609 Pain in unspecified limb: Secondary | ICD-10-CM | POA: Diagnosis not present

## 2020-02-03 DIAGNOSIS — Z8249 Family history of ischemic heart disease and other diseases of the circulatory system: Secondary | ICD-10-CM

## 2020-02-03 DIAGNOSIS — A419 Sepsis, unspecified organism: Secondary | ICD-10-CM | POA: Diagnosis present

## 2020-02-03 DIAGNOSIS — Z8674 Personal history of sudden cardiac arrest: Secondary | ICD-10-CM

## 2020-02-03 DIAGNOSIS — J9601 Acute respiratory failure with hypoxia: Secondary | ICD-10-CM | POA: Diagnosis not present

## 2020-02-03 DIAGNOSIS — E785 Hyperlipidemia, unspecified: Secondary | ICD-10-CM | POA: Diagnosis present

## 2020-02-03 DIAGNOSIS — I255 Ischemic cardiomyopathy: Secondary | ICD-10-CM | POA: Diagnosis present

## 2020-02-03 DIAGNOSIS — N183 Chronic kidney disease, stage 3 unspecified: Secondary | ICD-10-CM | POA: Diagnosis present

## 2020-02-03 DIAGNOSIS — I248 Other forms of acute ischemic heart disease: Secondary | ICD-10-CM | POA: Diagnosis present

## 2020-02-03 DIAGNOSIS — Z823 Family history of stroke: Secondary | ICD-10-CM

## 2020-02-03 DIAGNOSIS — I4821 Permanent atrial fibrillation: Secondary | ICD-10-CM | POA: Diagnosis present

## 2020-02-03 DIAGNOSIS — Z95810 Presence of automatic (implantable) cardiac defibrillator: Secondary | ICD-10-CM

## 2020-02-03 DIAGNOSIS — N179 Acute kidney failure, unspecified: Secondary | ICD-10-CM | POA: Diagnosis present

## 2020-02-03 DIAGNOSIS — L8990 Pressure ulcer of unspecified site, unspecified stage: Secondary | ICD-10-CM | POA: Insufficient documentation

## 2020-02-03 DIAGNOSIS — E1122 Type 2 diabetes mellitus with diabetic chronic kidney disease: Secondary | ICD-10-CM | POA: Diagnosis present

## 2020-02-03 DIAGNOSIS — N1832 Chronic kidney disease, stage 3b: Secondary | ICD-10-CM

## 2020-02-03 DIAGNOSIS — J9621 Acute and chronic respiratory failure with hypoxia: Secondary | ICD-10-CM | POA: Diagnosis present

## 2020-02-03 DIAGNOSIS — E559 Vitamin D deficiency, unspecified: Secondary | ICD-10-CM | POA: Diagnosis present

## 2020-02-03 DIAGNOSIS — I420 Dilated cardiomyopathy: Secondary | ICD-10-CM | POA: Diagnosis present

## 2020-02-03 DIAGNOSIS — E871 Hypo-osmolality and hyponatremia: Secondary | ICD-10-CM | POA: Diagnosis present

## 2020-02-03 DIAGNOSIS — Z7901 Long term (current) use of anticoagulants: Secondary | ICD-10-CM

## 2020-02-03 DIAGNOSIS — I2510 Atherosclerotic heart disease of native coronary artery without angina pectoris: Secondary | ICD-10-CM | POA: Diagnosis present

## 2020-02-03 DIAGNOSIS — Z20822 Contact with and (suspected) exposure to covid-19: Secondary | ICD-10-CM | POA: Diagnosis present

## 2020-02-03 DIAGNOSIS — Z515 Encounter for palliative care: Secondary | ICD-10-CM | POA: Diagnosis not present

## 2020-02-03 DIAGNOSIS — I509 Heart failure, unspecified: Secondary | ICD-10-CM | POA: Diagnosis present

## 2020-02-03 DIAGNOSIS — Z83438 Family history of other disorder of lipoprotein metabolism and other lipidemia: Secondary | ICD-10-CM

## 2020-02-03 DIAGNOSIS — I5033 Acute on chronic diastolic (congestive) heart failure: Secondary | ICD-10-CM

## 2020-02-03 DIAGNOSIS — Z9981 Dependence on supplemental oxygen: Secondary | ICD-10-CM

## 2020-02-03 DIAGNOSIS — R7989 Other specified abnormal findings of blood chemistry: Secondary | ICD-10-CM | POA: Diagnosis present

## 2020-02-03 LAB — COMPREHENSIVE METABOLIC PANEL
ALT: 17 U/L (ref 0–44)
AST: 25 U/L (ref 15–41)
Albumin: 3.9 g/dL (ref 3.5–5.0)
Alkaline Phosphatase: 59 U/L (ref 38–126)
Anion gap: 15 (ref 5–15)
BUN: 87 mg/dL — ABNORMAL HIGH (ref 8–23)
CO2: 26 mmol/L (ref 22–32)
Calcium: 8.9 mg/dL (ref 8.9–10.3)
Chloride: 96 mmol/L — ABNORMAL LOW (ref 98–111)
Creatinine, Ser: 3.03 mg/dL — ABNORMAL HIGH (ref 0.44–1.00)
GFR, Estimated: 15 mL/min — ABNORMAL LOW (ref >60)
Glucose, Bld: 155 mg/dL — ABNORMAL HIGH (ref 70–99)
Potassium: 4.8 mmol/L (ref 3.5–5.1)
Sodium: 137 mmol/L (ref 135–145)
Total Bilirubin: 1.1 mg/dL (ref 0.3–1.2)
Total Protein: 6.9 g/dL (ref 6.5–8.1)

## 2020-02-03 LAB — CBG MONITORING, ED
Glucose-Capillary: 202 mg/dL — ABNORMAL HIGH (ref 70–99)
Glucose-Capillary: 184 mg/dL — ABNORMAL HIGH (ref 70–99)

## 2020-02-03 LAB — CBC WITH DIFFERENTIAL/PLATELET
Abs Immature Granulocytes: 0.09 10*3/uL — ABNORMAL HIGH (ref 0.00–0.07)
Basophils Absolute: 0 10*3/uL (ref 0.0–0.1)
Basophils Relative: 0 %
Eosinophils Absolute: 0 10*3/uL (ref 0.0–0.5)
Eosinophils Relative: 0 %
HCT: 37.9 % (ref 36.0–46.0)
Hemoglobin: 11.3 g/dL — ABNORMAL LOW (ref 12.0–15.0)
Immature Granulocytes: 1 %
Lymphocytes Relative: 8 %
Lymphs Abs: 1.2 10*3/uL (ref 0.7–4.0)
MCH: 27.2 pg (ref 26.0–34.0)
MCHC: 29.8 g/dL — ABNORMAL LOW (ref 30.0–36.0)
MCV: 91.1 fL (ref 80.0–100.0)
Monocytes Absolute: 1.1 10*3/uL — ABNORMAL HIGH (ref 0.1–1.0)
Monocytes Relative: 7 %
Neutro Abs: 13.4 10*3/uL — ABNORMAL HIGH (ref 1.7–7.7)
Neutrophils Relative %: 84 %
Platelets: 117 10*3/uL — ABNORMAL LOW (ref 150–400)
RBC: 4.16 MIL/uL (ref 3.87–5.11)
RDW: 17.2 % — ABNORMAL HIGH (ref 11.5–15.5)
WBC: 15.8 10*3/uL — ABNORMAL HIGH (ref 4.0–10.5)
nRBC: 0 % (ref 0.0–0.2)

## 2020-02-03 LAB — I-STAT CHEM 8, ED
BUN: 98 mg/dL — ABNORMAL HIGH (ref 8–23)
Calcium, Ion: 1.04 mmol/L — ABNORMAL LOW (ref 1.15–1.40)
Chloride: 96 mmol/L — ABNORMAL LOW (ref 98–111)
Creatinine, Ser: 2.7 mg/dL — ABNORMAL HIGH (ref 0.44–1.00)
Glucose, Bld: 151 mg/dL — ABNORMAL HIGH (ref 70–99)
HCT: 40 % (ref 36.0–46.0)
Hemoglobin: 13.6 g/dL (ref 12.0–15.0)
Potassium: 4.7 mmol/L (ref 3.5–5.1)
Sodium: 136 mmol/L (ref 135–145)
TCO2: 31 mmol/L (ref 22–32)

## 2020-02-03 LAB — CBC
HCT: 39.8 % (ref 36.0–46.0)
Hemoglobin: 11.7 g/dL — ABNORMAL LOW (ref 12.0–15.0)
MCH: 27.4 pg (ref 26.0–34.0)
MCHC: 29.4 g/dL — ABNORMAL LOW (ref 30.0–36.0)
MCV: 93.2 fL (ref 80.0–100.0)
Platelets: 176 10*3/uL (ref 150–400)
RBC: 4.27 MIL/uL (ref 3.87–5.11)
RDW: 17.2 % — ABNORMAL HIGH (ref 11.5–15.5)
WBC: 10.3 10*3/uL (ref 4.0–10.5)
nRBC: 0 % (ref 0.0–0.2)

## 2020-02-03 LAB — I-STAT ARTERIAL BLOOD GAS, ED
Acid-Base Excess: 5 mmol/L — ABNORMAL HIGH (ref 0.0–2.0)
Bicarbonate: 31.2 mmol/L — ABNORMAL HIGH (ref 20.0–28.0)
Calcium, Ion: 1.07 mmol/L — ABNORMAL LOW (ref 1.15–1.40)
HCT: 36 % (ref 36.0–46.0)
Hemoglobin: 12.2 g/dL (ref 12.0–15.0)
O2 Saturation: 100 %
Patient temperature: 100.7
Potassium: 4.7 mmol/L (ref 3.5–5.1)
Sodium: 136 mmol/L (ref 135–145)
TCO2: 33 mmol/L — ABNORMAL HIGH (ref 22–32)
pCO2 arterial: 54.1 mmHg — ABNORMAL HIGH (ref 32.0–48.0)
pH, Arterial: 7.374 (ref 7.350–7.450)
pO2, Arterial: 201 mmHg — ABNORMAL HIGH (ref 83.0–108.0)

## 2020-02-03 LAB — URINALYSIS, ROUTINE W REFLEX MICROSCOPIC
Bilirubin Urine: NEGATIVE
Glucose, UA: NEGATIVE mg/dL
Ketones, ur: NEGATIVE mg/dL
Nitrite: NEGATIVE
Protein, ur: NEGATIVE mg/dL
Specific Gravity, Urine: 1.009 (ref 1.005–1.030)
pH: 5 (ref 5.0–8.0)

## 2020-02-03 LAB — I-STAT VENOUS BLOOD GAS, ED
Acid-Base Excess: 6 mmol/L — ABNORMAL HIGH (ref 0.0–2.0)
Bicarbonate: 32.7 mmol/L — ABNORMAL HIGH (ref 20.0–28.0)
Calcium, Ion: 1.04 mmol/L — ABNORMAL LOW (ref 1.15–1.40)
HCT: 40 % (ref 36.0–46.0)
Hemoglobin: 13.6 g/dL (ref 12.0–15.0)
O2 Saturation: 98 %
Potassium: 4.7 mmol/L (ref 3.5–5.1)
Sodium: 136 mmol/L (ref 135–145)
TCO2: 34 mmol/L — ABNORMAL HIGH (ref 22–32)
pCO2, Ven: 56.6 mmHg (ref 44.0–60.0)
pH, Ven: 7.369 (ref 7.250–7.430)
pO2, Ven: 118 mmHg — ABNORMAL HIGH (ref 32.0–45.0)

## 2020-02-03 LAB — RESP PANEL BY RT-PCR (FLU A&B, COVID) ARPGX2
Influenza A by PCR: NEGATIVE
Influenza B by PCR: NEGATIVE
SARS Coronavirus 2 by RT PCR: NEGATIVE

## 2020-02-03 LAB — GLUCOSE, CAPILLARY
Glucose-Capillary: 148 mg/dL — ABNORMAL HIGH (ref 70–99)
Glucose-Capillary: 95 mg/dL (ref 70–99)

## 2020-02-03 LAB — HEMOGLOBIN A1C
Hgb A1c MFr Bld: 6.5 % — ABNORMAL HIGH (ref 4.8–5.6)
Mean Plasma Glucose: 139.85 mg/dL

## 2020-02-03 LAB — TROPONIN I (HIGH SENSITIVITY)
Troponin I (High Sensitivity): 40 ng/L — ABNORMAL HIGH (ref <18)
Troponin I (High Sensitivity): 57 ng/L — ABNORMAL HIGH (ref <18)

## 2020-02-03 LAB — PROCALCITONIN: Procalcitonin: 0.23 ng/mL

## 2020-02-03 LAB — BRAIN NATRIURETIC PEPTIDE: B Natriuretic Peptide: 1118.4 pg/mL — ABNORMAL HIGH (ref 0.0–100.0)

## 2020-02-03 LAB — MAGNESIUM: Magnesium: 2.3 mg/dL (ref 1.7–2.4)

## 2020-02-03 MED ORDER — INSULIN ASPART 100 UNIT/ML ~~LOC~~ SOLN
0.0000 [IU] | Freq: Every day | SUBCUTANEOUS | Status: DC
  Administered 2020-02-05 – 2020-02-07 (×2): 2 [IU] via SUBCUTANEOUS
  Administered 2020-02-08: 4 [IU] via SUBCUTANEOUS
  Administered 2020-02-10: 2 [IU] via SUBCUTANEOUS

## 2020-02-03 MED ORDER — FUROSEMIDE 10 MG/ML IJ SOLN: 40.0000 mg | Freq: Two times a day (BID) | INTRAMUSCULAR | Status: DC

## 2020-02-03 MED ORDER — INSULIN ASPART 100 UNIT/ML ~~LOC~~ SOLN
0.0000 [IU] | Freq: Three times a day (TID) | SUBCUTANEOUS | Status: DC
  Administered 2020-02-03: 5 [IU] via SUBCUTANEOUS
  Administered 2020-02-03: 2 [IU] via SUBCUTANEOUS
  Administered 2020-02-03: 3 [IU] via SUBCUTANEOUS
  Administered 2020-02-04: 5 [IU] via SUBCUTANEOUS
  Administered 2020-02-04: 3 [IU] via SUBCUTANEOUS
  Administered 2020-02-04: 11 [IU] via SUBCUTANEOUS
  Administered 2020-02-05: 2 [IU] via SUBCUTANEOUS
  Administered 2020-02-05 – 2020-02-07 (×5): 3 [IU] via SUBCUTANEOUS
  Administered 2020-02-07: 2 [IU] via SUBCUTANEOUS
  Administered 2020-02-08 – 2020-02-09 (×5): 3 [IU] via SUBCUTANEOUS
  Administered 2020-02-10: 5 [IU] via SUBCUTANEOUS
  Administered 2020-02-10: 8 [IU] via SUBCUTANEOUS
  Administered 2020-02-11: 3 [IU] via SUBCUTANEOUS
  Administered 2020-02-11 (×2): 5 [IU] via SUBCUTANEOUS
  Administered 2020-02-12 (×2): 3 [IU] via SUBCUTANEOUS
  Administered 2020-02-12: 2 [IU] via SUBCUTANEOUS
  Administered 2020-02-13: 3 [IU] via SUBCUTANEOUS
  Administered 2020-02-13: 5 [IU] via SUBCUTANEOUS
  Administered 2020-02-13: 2 [IU] via SUBCUTANEOUS

## 2020-02-03 MED ORDER — ACETAMINOPHEN 325 MG PO TABS
650.0000 mg | ORAL_TABLET | Freq: Four times a day (QID) | ORAL | Status: DC | PRN
  Administered 2020-02-04 – 2020-02-05 (×2): 650 mg via ORAL
  Filled 2020-02-03 (×2): qty 2

## 2020-02-03 MED ORDER — SODIUM CHLORIDE 0.9 % IV SOLN
1.0000 g | INTRAVENOUS | Status: AC
  Administered 2020-02-03 – 2020-02-11 (×9): 1 g via INTRAVENOUS
  Filled 2020-02-03 (×10): qty 10

## 2020-02-03 MED ORDER — FUROSEMIDE 10 MG/ML IJ SOLN
80.0000 mg | Freq: Two times a day (BID) | INTRAMUSCULAR | Status: DC
  Administered 2020-02-03 – 2020-02-04 (×3): 80 mg via INTRAVENOUS
  Filled 2020-02-03 (×3): qty 8

## 2020-02-03 MED ORDER — ORAL CARE MOUTH RINSE
15.0000 mL | Freq: Two times a day (BID) | OROMUCOSAL | Status: DC
  Administered 2020-02-03 – 2020-02-14 (×14): 15 mL via OROMUCOSAL

## 2020-02-03 MED ORDER — CEFAZOLIN SODIUM-DEXTROSE 1-4 GM/50ML-% IV SOLN: 1.0000 g | Freq: Three times a day (TID) | INTRAVENOUS | Status: DC

## 2020-02-03 MED ORDER — FUROSEMIDE 10 MG/ML IJ SOLN
40.0000 mg | Freq: Once | INTRAMUSCULAR | Status: AC
  Administered 2020-02-03: 40 mg via INTRAVENOUS
  Filled 2020-02-03: qty 4

## 2020-02-03 MED ORDER — CHLORHEXIDINE GLUCONATE 0.12 % MT SOLN
15.0000 mL | Freq: Two times a day (BID) | OROMUCOSAL | Status: DC
  Administered 2020-02-04 – 2020-02-14 (×19): 15 mL via OROMUCOSAL
  Filled 2020-02-03 (×20): qty 15

## 2020-02-03 MED ORDER — HEPARIN SODIUM (PORCINE) 5000 UNIT/ML IJ SOLN
5000.0000 [IU] | Freq: Three times a day (TID) | INTRAMUSCULAR | Status: DC
  Administered 2020-02-03 – 2020-02-04 (×3): 5000 [IU] via SUBCUTANEOUS
  Filled 2020-02-03 (×3): qty 1

## 2020-02-03 MED ORDER — ACETAMINOPHEN 650 MG RE SUPP
650.0000 mg | Freq: Four times a day (QID) | RECTAL | Status: DC | PRN
  Administered 2020-02-03: 650 mg via RECTAL
  Filled 2020-02-03: qty 1

## 2020-02-03 MED ORDER — POTASSIUM CHLORIDE CRYS ER 20 MEQ PO TBCR: 20.0000 meq | EXTENDED_RELEASE_TABLET | Freq: Once | ORAL | Status: DC

## 2020-02-03 NOTE — ED Triage Notes (Signed)
Pt arrives to ED BIB [facility] due to Leg Swelling and possible fluid overload. Per EMS pt has been seen for same at [facility] for same and discharged x1 ago.. Per EMS pt's son wanted his mother (Pt) to be evaluated here at [facility] for a second opinion. Pt has bilateral leg edema that is weeping. Hx of CHF. Pt is a/o x1.   BP 138  Palpated HR 65 O2 97% 2L [place] CBG 132

## 2020-02-03 NOTE — Progress Notes (Signed)
[facility] PROGRESS NOTE    Progress Note  Lisa [name]  FMB:[id number] DOB: May 04, 1935 DOA: 02/03/2020 PCP: Colonel Bald, MD     Brief Narrative:   Lisa [name] is an 84 y.o. female non-English-speaking past medical history significant for paroxysmal atrial fibrillation on Xarelto, CAD, ischemic dilated cardiomyopathy with an EF of 25% status post AICD chronic respiratory failure on 2 L of oxygen chronic kidney disease stage IV insulin-dependent diabetes mellitus type 2 coronary artery disease presents via EMS for lower extremity edema.  Chest x-ray was done that showed pulmonary edema SARS-CoV-2 PCR was negative.  Assessment/Plan:   New onset of feve possibly due to left lower extremity cellulitis: With a left lower extremity swelling erythematous and warm to touch, check a CBC with differential. We will start her on IV Ancef check blood cultures check a procalcitonin. Use Tylenol for fevers. Remedy Doppler will be low yield is currently pending.  Acute respiratory failure with hypoxia due to Acute on chronic systolic heart failure with an EF of 25%: 2D echo was done on 01/27/2020 that showed an EF of 25%. Chest x-ray showed bilateral infiltrates with a BNP of 1100. She does appear to be fluid overloaded on physical exam, increase Lasix to IV twice daily. Monitor electrolytes and replete as needed. Currently on BiPAP as we diurese, try to wean her to her home baseline of 2 L of oxygen.  Acute kidney injury on chronic kidney disease stage IV: With a baseline creatinine 1.4-1.8.  Patient 3.0 she was started on IV diuresis and her creatinine is improving slowly this morning is 2.7. Continue IV Lasix regards and O's and daily weights limit her oral intake.  Paroxysmal atrial fibrillation: Currently rate controlled on amiodarone and metoprolol. Continue Eliquis.  Elevated troponins: Likely demand ischemia in the setting of heart failure the cardiac biomarkers  have remained flat.  Prolonged QTC: Try to keep mag above 2 potassium greater than 4.  Essential hypertension: Relatively well-controlled continue current regimen.  Insulin-dependent diabetes mellitus type 2: With an A1c of 7.1 continue sliding scale insulin and long-acting insulin.  Ethics/goals of care: I have been unsuccessful to contact the family has tried several family members including the son who picked up the phone and as soon as I identified myself he hung up and never picked up again. The patient is a non-English speaking somnolent will try again later.   DVT prophylaxis: lovenox Family Communication:unable to contact Status is: Inpatient  Remains inpatient appropriate because:Hemodynamically unstable   Dispo: The patient is from: Home              Anticipated d/c is to: SNF              Anticipated d/c date is: > 3 days              Patient currently is not medically stable to d/c.        Code Status:     Code Status Orders  (From admission, onward)         Start     Ordered   02/03/20 0431  Full code  Continuous        02/03/20 0431        Code Status History    Date Active Date Inactive Code Status Order ID Comments User Context   10/24/2015 1835 11/14/2015 1941 Full Code [id number]  Ozzie Hoyle Inpatient   09/17/2015 2132 09/25/2015 1839 Full Code [id number]  Toy Baker, MD Inpatient  08/23/2014 2100 08/28/2014 1546 Full Code [id number]  Isaiah Serge, NP Inpatient   Advance Care Planning Activity        IV Access:    Peripheral IV   Procedures and diagnostic studies:   DG Chest Portable 1 View  Result Date: 02/03/2020 CLINICAL DATA:  Leg swelling EXAM: PORTABLE CHEST 1 VIEW COMPARISON:  January 28, 2020 FINDINGS: The heart size and mediastinal contours are unchanged with cardiomegaly. A left-sided pacemaker again noted. There is diffusely increased interstitial markings throughout both lungs. No definite pleural effusion.  No acute osseous abnormality. IMPRESSION: Findings which may be suggestive of interstitial edema. Electronically Signed   By: Prudencio Pair M.D.   On: 02/03/2020 02:49     Medical Consultants:    None.  Anti-Infectives:   rocephin  Subjective:    Lisa [name] somnolent  Objective:    Vitals:   02/03/20 0500 02/03/20 0515 02/03/20 0645 02/03/20 0659  BP: (!) 124/39 (!) 125/44 (!) 122/44   Pulse: 70 70 63   Resp: (!) 25 20 (!) 23   Temp: (!) 100.7 F (38.2 C)   (!) 101.7 F (38.7 C)  TempSrc: Axillary   Rectal  SpO2: 97% 99% 97%    SpO2: 97 % FiO2 (%): (S) 40 % (per abg)   Intake/Output Summary (Last 24 hours) at 02/03/2020 0708 Last data filed at 02/03/2020 0701 Gross per 24 hour  Intake 100 ml  Output 15 ml  Net 85 ml   There were no vitals filed for this visit.  Exam: General exam: In no acute distress. Respiratory system: Good air movement and crackles at bases bilaterally Cardiovascular system: S1 & S2 heard, RRR. + JVD Gastrointestinal system: Abdomen is nondistended, soft and nontender.  Extremities: No pedal edema. Skin: No rashes, lesions or ulcers  Data Reviewed:    Labs: Basic Metabolic Panel: Recent Labs  Lab 02/03/20 0219 02/03/20 0304 02/03/20 0305 02/03/20 0535  NA 137 136 136 136  K 4.8 4.7 4.7 4.7  CL 96*  --  96*  --   CO2 26  --   --   --   GLUCOSE 155*  --  151*  --   BUN 87*  --  98*  --   CREATININE 3.03*  --  2.70*  --   CALCIUM 8.9  --   --   --   MG 2.3  --   --   --    GFR CrCl cannot be calculated (Unknown ideal weight.). Liver Function Tests: Recent Labs  Lab 02/03/20 0219  AST 25  ALT 17  ALKPHOS 59  BILITOT 1.1  PROT 6.9  ALBUMIN 3.9   No results for input(s): LIPASE, AMYLASE in the last 168 hours. No results for input(s): AMMONIA in the last 168 hours. Coagulation profile No results for input(s): INR, PROTIME in the last 168 hours. COVID-19 Labs  No results for input(s): DDIMER, FERRITIN, LDH,  CRP in the last 72 hours.  Lab Results  Component Value Date   [place] NEGATIVE 02/03/2020    CBC: Recent Labs  Lab 02/03/20 0219 02/03/20 0304 02/03/20 0305 02/03/20 0535  WBC 10.3  --   --   --   HGB 11.7* 13.6 13.6 12.2  HCT 39.8 40.0 40.0 36.0  MCV 93.2  --   --   --   PLT 176  --   --   --    Cardiac Enzymes: No results for input(s): CKTOTAL, CKMB, CKMBINDEX, TROPONINI in the  last 168 hours. BNP (last 3 results) No results for input(s): PROBNP in the last 8760 hours. CBG: No results for input(s): GLUCAP in the last 168 hours. D-Dimer: No results for input(s): DDIMER in the last 72 hours. Hgb A1c: Recent Labs    02/03/20 0508  HGBA1C 6.5*   Lipid Profile: No results for input(s): CHOL, HDL, LDLCALC, TRIG, CHOLHDL, LDLDIRECT in the last 72 hours. Thyroid function studies: No results for input(s): TSH, T4TOTAL, T3FREE, THYROIDAB in the last 72 hours.  Invalid input(s): FREET3 Anemia work up: No results for input(s): VITAMINB12, FOLATE, FERRITIN, TIBC, IRON, RETICCTPCT in the last 72 hours. Sepsis Labs: Recent Labs  Lab 02/03/20 0219  WBC 10.3   Microbiology Recent Results (from the past 240 hour(s))  Resp Panel by RT-PCR (Flu A&B, Covid) Nasopharyngeal Swab     Status: None   Collection Time: 02/03/20  2:25 AM   Specimen: Nasopharyngeal Swab; Nasopharyngeal(NP) swabs in vial transport medium  Result Value Ref Range Status   SARS Coronavirus 2 by RT PCR NEGATIVE NEGATIVE Final    Comment: (NOTE) SARS-CoV-2 target nucleic acids are NOT DETECTED.  The SARS-CoV-2 RNA is generally detectable in upper respiratory specimens during the acute phase of infection. The lowest concentration of SARS-CoV-2 viral copies this assay can detect is 138 copies/mL. A negative result does not preclude SARS-Cov-2 infection and should not be used as the sole basis for treatment or other patient management decisions. A negative result may occur with  improper specimen  collection/handling, submission of specimen other than nasopharyngeal swab, presence of viral mutation(s) within the areas targeted by this assay, and inadequate number of viral copies(<138 copies/mL). A negative result must be combined with clinical observations, patient history, and epidemiological information. The expected result is Negative.  Fact Sheet for Patients:  EntrepreneurPulse.com.au  Fact Sheet for Healthcare Providers:  IncredibleEmployment.be  This test is no t yet approved or cleared by the [place] FDA and  has been authorized for detection and/or diagnosis of SARS-CoV-2 by FDA under an Emergency Use Authorization (EUA). This EUA will remain  in effect (meaning this test can be used) for the duration of the COVID-19 declaration under Section 564(b)(1) of the Act, 21 U.S.C.section 360bbb-3(b)(1), unless the authorization is terminated  or revoked sooner.       Influenza A by PCR NEGATIVE NEGATIVE Final   Influenza B by PCR NEGATIVE NEGATIVE Final    Comment: (NOTE) The Xpert Xpress SARS-CoV-2/FLU/RSV plus assay is intended as an aid in the diagnosis of influenza from Nasopharyngeal swab specimens and should not be used as a sole basis for treatment. Nasal washings and aspirates are unacceptable for Xpert Xpress SARS-CoV-2/FLU/RSV testing.  Fact Sheet for Patients: EntrepreneurPulse.com.au  Fact Sheet for Healthcare Providers: IncredibleEmployment.be  This test is not yet approved or cleared by the [place] FDA and has been authorized for detection and/or diagnosis of SARS-CoV-2 by FDA under an Emergency Use Authorization (EUA). This EUA will remain in effect (meaning this test can be used) for the duration of the COVID-19 declaration under Section 564(b)(1) of the Act, 21 U.S.C. section 360bbb-3(b)(1), unless the authorization is terminated or revoked.  Performed at [facility], [place] [address] [place], [place] [address]      Medications:   . furosemide  40 mg Intravenous BID  . heparin  5,000 Units Subcutaneous Q8H  . insulin aspart  0-15 Units Subcutaneous TID WC  . insulin aspart  0-5 Units Subcutaneous QHS   Continuous  Infusions: . cefTRIAXone (ROCEPHIN)  IV Stopped (02/03/20 0630)      LOS: 0 days   Charlynne Cousins  [facility]  02/03/2020, 7:08 AM

## 2020-02-03 NOTE — Progress Notes (Addendum)
Pt received into [facility] from ED. Pt is on bipap, pointing at her mouth and pulling at staff trying to communicate. Pt was taken off bipap and placed on 2 liters nasal cannula and sats stayed 100. Gave patient a cup of water and she able to rest. Pt 's only belonging at bedside is a dress. ER nurse stated she sent the rest of patient clothes home. Continue to monitor.Ellamae Sia  Pt is also wearing a pair of gold ring earrings. Etta Quill.RN

## 2020-02-03 NOTE — ED Provider Notes (Signed)
[facility] EMERGENCY DEPARTMENT Provider Note  CSN: [id number] Arrival date & time: 02/03/20 0147  Chief Complaint(s) Leg Swelling  HPI Lisa [name] is a 84 y.o. female with extensive past medical history listed below including CHF with a last EF of 25 to 30%'s on December 24 brought in for peripheral edema and shortness of breath by EMS.  Patient came from home.  Was reportedly admitted to [facility] last week for CHF exacerbation.  She was discharged yesterday after diuresing.   Patient is [place] and hypoxic with sats in 70%s on NRB, limiting history.  Multiple attempts to call family were unsuccessful.  Attempts to obtain an interpreter were also unsuccessful.  Remainder of history, ROS, and physical exam limited due to patient's condition (acuity of condition). Additional information was obtained from EMS.   Level V Caveat.    HPI  Past Medical History Past Medical History:  Diagnosis Date  . Anemia   . Carotid arterial disease ([place])   . Chronic combined systolic and diastolic CHF (congestive heart failure) ([place])    a. 08/2014 Echo: EF 40-45%, no rwma, bicuspid AoV, mild AS, mod dil Asc Ao, PASP 45mmHg.  . CKD (chronic kidney disease), stage III ([place])   . Diabetes mellitus without complication ([place])   . Essential hypertension   . Mild aortic stenosis    a. 08/2014 Echo: mild AS, bicuspid AoV.  . OSA on CPAP   . Osteoarthritis of both ankles   . Persistent atrial fibrillation    a. Dx 08/2013, CHA2DS2VASc=5-->Xarelto.  . Vitamin D deficiency    Patient Active Problem List   Diagnosis Date Noted  . Pericardial effusion with cardiac tamponade post draaijnage 10/17/15 11/07/2015  . Chest tube in place left 11/07/2015  . Acute respiratory failure ([place])   . Pleural effusion   . Acute kidney injury ([place]) 09/18/2015  . Sinus pause 09/18/2015  . Liver lesion 09/18/2015  . Leukocytosis 09/17/2015  . Elevated troponin 09/17/2015  . Chest pain  09/17/2015  . Uncontrolled type 2 diabetes mellitus with circulatory disorder, with long-term current use of insulin ([place]) 08/02/2015  . Essential hypertension   . Acute on chronic combined systolic and diastolic heart failure ([place])   . Aortic stenosis due to bicuspid aortic valve   . OSA on CPAP   . Persistent atrial fibrillation ([place])   . CKD (chronic kidney disease), stage III ([place])   . Atrial fibrillation with RVR ([place]) 08/23/2014   Home Medication(s) Prior to Admission medications   Medication Sig Start Date End Date Taking? Authorizing Provider  butalbital-acetaminophen-caffeine (FIORICET, ESGIC) [phone] MG tablet Take 1 tablet by mouth every 6 (six) hours as needed for headache. 09/25/15   Lavina Hamman, MD  carvedilol (COREG) 12.5 MG tablet Take 1 tablet (12.5 mg total) by mouth 2 (two) times daily with a meal. 09/25/15   Lavina Hamman, MD  docusate sodium (COLACE) 100 MG capsule Take 1 capsule (100 mg total) by mouth daily. 09/25/15   Lavina Hamman, MD  furosemide (LASIX) 40 MG tablet Take 1 tablet (40 mg total) by mouth 2 (two) times daily. 09/25/15   Lavina Hamman, MD  glucose blood (ACCU-CHEK GUIDE) test strip 1 each by Other route 4 (four) times daily. Use as instructed 04/11/19   Shamleffer, Melanie Crazier, MD  hydrALAZINE (APRESOLINE) 25 MG tablet Take 1.5 tablets (37.5 mg total) by mouth every 8 (eight) hours. 09/25/15   Lavina Hamman, MD  insulin aspart (NOVOLOG  FLEXPEN) 100 UNIT/ML FlexPen Inject 12 Units into the skin 3 (three) times daily with meals. Pt uses per sliding scale. 04/11/19   Shamleffer, Melanie Crazier, MD  Insulin Pen Needle (BD PEN NEEDLE NANO U/F) 32G X 4 MM MISC Four times daily 12/18/17   Shamleffer, Melanie Crazier, MD  polyethylene glycol (MIRALAX / GLYCOLAX) packet Take 17 g by mouth daily. 09/25/15   Lavina Hamman, MD  rivaroxaban (XARELTO) 15 MG TABS tablet Take 1 tablet (15 mg total) by mouth daily with supper. 09/25/15   Lavina Hamman, MD   rosuvastatin (CRESTOR) 10 MG tablet Take 1 tablet (10 mg total) by mouth daily. 09/28/14   Isaiah Serge, NP  TRESIBA FLEXTOUCH 100 UNIT/ML FlexTouch Pen Inject 0.3 mLs (30 Units total) into the skin at bedtime. 04/20/19   Shamleffer, Melanie Crazier, MD                                                                                                                                    Past Surgical History Past Surgical History:  Procedure Laterality Date  . CHOLECYSTECTOMY     Family History Family History  Problem Relation Age of Onset  . Diabetes Mother   . Hyperlipidemia Mother   . Hypertension Mother   . Stroke Sister   . Heart attack Neg Hx     Social History Social History   Tobacco Use  . Smoking status: Former Research scientist (life sciences)  . Smokeless tobacco: Never Used  Substance Use Topics  . Alcohol use: No  . Drug use: No   Allergies Lisinopril  Review of Systems Review of Systems  Unable to perform ROS: Acuity of condition    Physical Exam Vital Signs  I have reviewed the triage vital signs BP (!) 130/59   Pulse 68   Resp (!) 26   SpO2 71% NRB  Physical Exam Vitals reviewed.  Constitutional:      General: She is not in acute distress.    Appearance: She is well-developed and well-nourished. She is not diaphoretic.  HENT:     Head: Normocephalic and atraumatic.     Nose: Nose normal.  Eyes:     General: No scleral icterus.       Right eye: No discharge.        Left eye: No discharge.     Extraocular Movements: EOM normal.     Conjunctiva/sclera: Conjunctivae normal.     Pupils: Pupils are equal, round, and reactive to light.  Cardiovascular:     Rate and Rhythm: Tachycardia present. Rhythm irregularly irregular.     Heart sounds: No murmur heard. No friction rub. No gallop.   Pulmonary:     Effort: Tachypnea and respiratory distress present.     Breath sounds: No stridor. Examination of the right-middle field reveals rales. Examination of the left-middle field  reveals rales. Examination of the right-lower field reveals rales. Examination of the  left-lower field reveals rales. Rales present.  Abdominal:     General: There is no distension.     Palpations: Abdomen is soft.     Tenderness: There is no abdominal tenderness.  Musculoskeletal:        General: No tenderness.     Cervical back: Normal range of motion and neck supple.     Right lower leg: 2+ Pitting Edema present.     Left lower leg: 2+ Pitting Edema present.  Skin:    General: Skin is warm and dry.     Findings: No erythema or rash.  Neurological:     Mental Status: She is lethargic.  Psychiatric:        Mood and Affect: Mood and affect normal.     ED Results and Treatments Labs (all labs ordered are listed, but only abnormal results are displayed) Labs Reviewed  CBC - Abnormal; Notable for the following components:      Result Value   Hemoglobin 11.7 (*)    MCHC 29.4 (*)    RDW 17.2 (*)    All other components within normal limits  I-STAT CHEM 8, ED - Abnormal; Notable for the following components:   Chloride 96 (*)    BUN 98 (*)    Creatinine, Ser 2.70 (*)    Glucose, Bld 151 (*)    Calcium, Ion 1.04 (*)    All other components within normal limits  I-STAT VENOUS BLOOD GAS, ED - Abnormal; Notable for the following components:   pO2, Ven 118.0 (*)    Bicarbonate 32.7 (*)    TCO2 34 (*)    Acid-Base Excess 6.0 (*)    Calcium, Ion 1.04 (*)    All other components within normal limits  RESP PANEL BY RT-PCR (FLU A&B, COVID) ARPGX2  MAGNESIUM  BRAIN NATRIURETIC PEPTIDE  COMPREHENSIVE METABOLIC PANEL  URINALYSIS, ROUTINE W REFLEX MICROSCOPIC  TROPONIN I (HIGH SENSITIVITY)                                                                                                                         EKG  EKG Interpretation  Date/Time:  Friday February 03 2020 02:19:44 EST Ventricular Rate:  78 PR Interval:    QRS Duration: 151 QT Interval:  418 QTC Calculation: 509 R  Axis:   33 Text Interpretation: Atrial fibrillation IVCD, consider atypical LBBB Confirmed by Addison Lank [phone]) on 02/03/2020 2:23:40 AM      Radiology DG Chest Portable 1 View  Result Date: 02/03/2020 CLINICAL DATA:  Leg swelling EXAM: PORTABLE CHEST 1 VIEW COMPARISON:  January 28, 2020 FINDINGS: The heart size and mediastinal contours are unchanged with cardiomegaly. A left-sided pacemaker again noted. There is diffusely increased interstitial markings throughout both lungs. No definite pleural effusion. No acute osseous abnormality. IMPRESSION: Findings which may be suggestive of interstitial edema. Electronically Signed   By: Prudencio Pair M.D.   On: 02/03/2020 02:49    Pertinent labs & imaging results that  were available during my care of the patient were reviewed by me and considered in my medical decision making (see chart for details).  Medications Ordered in ED Medications  potassium chloride SA (KLOR-CON) CR tablet 20 mEq (20 mEq Oral Not Given 02/03/20 0320)  furosemide (LASIX) injection 40 mg (40 mg Intravenous Given 02/03/20 0255)                                                                                                                                    Procedures .1-3 Lead EKG Interpretation Performed by: Fatima Blank, MD Authorized by: Fatima Blank, MD     Interpretation: abnormal     ECG rate:  78   ECG rate assessment: normal     Rhythm: atrial fibrillation     Ectopy: none     Conduction: normal   .Critical Care Performed by: Fatima Blank, MD Authorized by: Fatima Blank, MD   Critical care provider statement:    Critical care time (minutes):  45   Critical care was necessary to treat or prevent imminent or life-threatening deterioration of the following conditions:  Respiratory failure and cardiac failure   Critical care was time spent personally by me on the following activities:  Discussions with consultants,  evaluation of patient's response to treatment, examination of patient, ordering and performing treatments and interventions, ordering and review of laboratory studies, ordering and review of radiographic studies, pulse oximetry, re-evaluation of patient's condition, obtaining history from patient or surrogate and review of old charts    (including critical care time)  Medical Decision Making / ED Course I have reviewed the nursing notes for this encounter and the patient's prior records (if available in EHR or on provided paperwork).   Aija Scarfo was evaluated in Emergency Department on 02/03/2020 for the symptoms described in the history of present illness. She was evaluated in the context of the global COVID-19 pandemic, which necessitated consideration that the patient might be at risk for infection with the SARS-CoV-2 virus that causes COVID-19. Institutional protocols and algorithms that pertain to the evaluation of patients at risk for COVID-19 are in a state of rapid change based on information released by regulatory bodies including the CDC and federal and state organizations. These policies and algorithms were followed during the patient's care in the ED.  Acute hypoxic respiratory failure likely secondary to CHF exacerbation. Chest x-ray consistent with pulmonary edema. Screening labs obtained. Patient given 40 mg of Lasix. Placed on BiPAP.  On review of records, patient's BNP at [facility] was greater than 1300.  Echo performed on December 24 was as above with a EF of 25 to 30%. Patient had AKI with a creatinine of 3 upon discharge.  After being placed on BiPAP, patient saturations improved significantly. Labs notable for creatinine of 2.7 CBC without leukocytosis.  No significant anemia.   We will admit patient to the stepdown  unit for continued management       Final Clinical Impression(s) / ED Diagnoses Final diagnoses:  Acute hypoxemic respiratory  failure ([place])  Acute on chronic diastolic congestive heart failure ([place])      This chart was dictated using voice recognition software.  Despite best efforts to proofread,  errors can occur which can change the documentation meaning.   Fatima Blank, MD 02/03/20 [phone]

## 2020-02-03 NOTE — Progress Notes (Signed)
Pt's MEWS score on admission to floor was yellow. Protocol initiated.   Etta [email or web address].RN

## 2020-02-03 NOTE — ED Notes (Signed)
Patient incont of urine. Patient cleaned up and purewick placed on patient for accurate I&o.

## 2020-02-03 NOTE — H&P (Signed)
History and Physical    Lisa [name] XLK:[id number] DOB: [birth year] DOA: 02/03/2020  PCP: Colonel Bald, MD Patient coming from: Home  Chief Complaint: Bilateral lower extremity edema  HPI: Lisa [name] is a 84 y.o. female with medical history significant of paroxysmal A. fib on Xarelto, CAD, ischemic dilated cardiomyopathy status post AICD, chronic hypoxemic respiratory failure on 2 L home oxygen, history of cardiac arrest, pulmonary hypertension, CKD stage IV, insulin-dependent type 2 diabetes, hypertension, OSA on CPAP, carotid arterial disease presenting to the ED via EMS for evaluation of bilateral lower extremity edema.  No family available at this time.  [place] interpreter services used but patient is currently somnolent and not able to give any history.  She was recently admitted to [facility] for CHF exacerbation and echo revealed LVEF of 25 to 30%.  ED Course: Sats in the 70s, placed on BiPAP.  WBC 10.3, hemoglobin 11.7 (above baseline), platelet count 176K.  Sodium 137, potassium 4.8, chloride 96, bicarb 26, BUN 87, creatinine 3.0, glucose 155.  Magnesium level pending.  High-sensitivity troponin 40.  BMP pending.  UA pending.  SARS-CoV-2 PCR test pending.  VBG with pH 7.36.  Chest x-ray showing pulmonary edema.  Patient was given IV Lasix 40 mg.  Review of Systems:  All systems reviewed and apart from history of presenting illness, are negative.  Past Medical History:  Diagnosis Date  . Anemia   . Carotid arterial disease ([place])   . Chronic combined systolic and diastolic CHF (congestive heart failure) ([place])    a. 08/2014 Echo: EF 40-45%, no rwma, bicuspid AoV, mild AS, mod dil Asc Ao, PASP 62mmHg.  . CKD (chronic kidney disease), stage III ([place])   . Diabetes mellitus without complication ([place])   . Essential hypertension   . Mild aortic stenosis    a. 08/2014 Echo: mild AS, bicuspid AoV.  . OSA on CPAP   . Osteoarthritis of both ankles   .  Persistent atrial fibrillation    a. Dx 08/2013, CHA2DS2VASc=5-->Xarelto.  . Vitamin D deficiency     Past Surgical History:  Procedure Laterality Date  . CHOLECYSTECTOMY       reports that she has quit smoking. She has never used smokeless tobacco. She reports that she does not drink alcohol and does not use drugs.  Allergies  Allergen Reactions  . Lisinopril Cough    Family History  Problem Relation Age of Onset  . Diabetes Mother   . Hyperlipidemia Mother   . Hypertension Mother   . Stroke Sister   . Heart attack Neg Hx     Prior to Admission medications   Medication Sig Start Date End Date Taking? Authorizing Provider  butalbital-acetaminophen-caffeine (FIORICET, ESGIC) [phone] MG tablet Take 1 tablet by mouth every 6 (six) hours as needed for headache. 09/25/15   Lavina Hamman, MD  carvedilol (COREG) 12.5 MG tablet Take 1 tablet (12.5 mg total) by mouth 2 (two) times daily with a meal. 09/25/15   Lavina Hamman, MD  docusate sodium (COLACE) 100 MG capsule Take 1 capsule (100 mg total) by mouth daily. 09/25/15   Lavina Hamman, MD  furosemide (LASIX) 40 MG tablet Take 1 tablet (40 mg total) by mouth 2 (two) times daily. 09/25/15   Lavina Hamman, MD  glucose blood (ACCU-CHEK GUIDE) test strip 1 each by Other route 4 (four) times daily. Use as instructed 04/11/19   Shamleffer, Melanie Crazier, MD  hydrALAZINE (APRESOLINE) 25 MG tablet Take 1.5 tablets (  37.5 mg total) by mouth every 8 (eight) hours. 09/25/15   Lavina Hamman, MD  insulin aspart (NOVOLOG FLEXPEN) 100 UNIT/ML FlexPen Inject 12 Units into the skin 3 (three) times daily with meals. Pt uses per sliding scale. 04/11/19   Shamleffer, Melanie Crazier, MD  Insulin Pen Needle (BD PEN NEEDLE NANO U/F) 32G X 4 MM MISC Four times daily 12/18/17   Shamleffer, Melanie Crazier, MD  polyethylene glycol (MIRALAX / GLYCOLAX) packet Take 17 g by mouth daily. 09/25/15   Lavina Hamman, MD  rivaroxaban (XARELTO) 15 MG TABS tablet Take  1 tablet (15 mg total) by mouth daily with supper. 09/25/15   Lavina Hamman, MD  rosuvastatin (CRESTOR) 10 MG tablet Take 1 tablet (10 mg total) by mouth daily. 09/28/14   Isaiah Serge, NP  TRESIBA FLEXTOUCH 100 UNIT/ML FlexTouch Pen Inject 0.3 mLs (30 Units total) into the skin at bedtime. 04/20/19   Shamleffer, Melanie Crazier, MD    Physical Exam: Vitals:   02/03/20 0245 02/03/20 0315 02/03/20 0330  BP: (!) 130/59 (!) 120/55 118/61  Pulse: 68 66 66  Resp: (!) 26 20 (!) 22  SpO2: 95% 98% 98%    Physical Exam Constitutional:      General: She is not in acute distress. HENT:     Head: Normocephalic and atraumatic.  Cardiovascular:     Rate and Rhythm: Normal rate and regular rhythm.     Pulses: Normal pulses.     Heart sounds: Murmur heard.    Pulmonary:     Breath sounds: Rales present. No wheezing.     Comments: On BiPAP Abdominal:     General: Bowel sounds are normal.     Palpations: Abdomen is soft.     Tenderness: There is no abdominal tenderness. There is no guarding.  Musculoskeletal:     Cervical back: Normal range of motion and neck supple.     Right lower leg: Edema present.     Left lower leg: Edema present.     Comments: +4 pitting edema bilateral lower extremities  Skin:    General: Skin is warm and dry.     Comments: Left lower extremity appears erythematous and warm to touch  Neurological:     Mental Status: She is alert.     Comments: Somnolent but arousable     Labs on Admission: I have personally reviewed following labs and imaging studies  CBC: Recent Labs  Lab 02/03/20 0219 02/03/20 0304 02/03/20 0305  WBC 10.3  --   --   HGB 11.7* 13.6 13.6  HCT 39.8 40.0 40.0  MCV 93.2  --   --   PLT 176  --   --    Basic Metabolic Panel: Recent Labs  Lab 02/03/20 0219 02/03/20 0304 02/03/20 0305  NA 137 136 136  K 4.8 4.7 4.7  CL 96*  --  96*  CO2 26  --   --   GLUCOSE 155*  --  151*  BUN 87*  --  98*  CREATININE 3.03*  --  2.70*   CALCIUM 8.9  --   --   MG 2.3  --   --    GFR: CrCl cannot be calculated (Unknown ideal weight.). Liver Function Tests: Recent Labs  Lab 02/03/20 0219  AST 25  ALT 17  ALKPHOS 59  BILITOT 1.1  PROT 6.9  ALBUMIN 3.9   No results for input(s): LIPASE, AMYLASE in the last 168 hours. No results for input(s):  AMMONIA in the last 168 hours. Coagulation Profile: No results for input(s): INR, PROTIME in the last 168 hours. Cardiac Enzymes: No results for input(s): CKTOTAL, CKMB, CKMBINDEX, TROPONINI in the last 168 hours. BNP (last 3 results) No results for input(s): PROBNP in the last 8760 hours. HbA1C: No results for input(s): HGBA1C in the last 72 hours. CBG: No results for input(s): GLUCAP in the last 168 hours. Lipid Profile: No results for input(s): CHOL, HDL, LDLCALC, TRIG, CHOLHDL, LDLDIRECT in the last 72 hours. Thyroid Function Tests: No results for input(s): TSH, T4TOTAL, FREET4, T3FREE, THYROIDAB in the last 72 hours. Anemia Panel: No results for input(s): VITAMINB12, FOLATE, FERRITIN, TIBC, IRON, RETICCTPCT in the last 72 hours. Urine analysis:    Component Value Date/Time   COLORURINE YELLOW 10/25/2015 0530   APPEARANCEUR CLOUDY (A) 10/25/2015 0530   LABSPEC 1.012 10/25/2015 0530   PHURINE 5.0 10/25/2015 0530   GLUCOSEU NEGATIVE 10/25/2015 0530   HGBUR MODERATE (A) 10/25/2015 0530   BILIRUBINUR NEGATIVE 10/25/2015 0530   KETONESUR NEGATIVE 10/25/2015 0530   PROTEINUR 30 (A) 10/25/2015 0530   UROBILINOGEN 0.2 08/23/2014 1838   NITRITE NEGATIVE 10/25/2015 0530   LEUKOCYTESUR TRACE (A) 10/25/2015 0530    Radiological Exams on Admission: DG Chest Portable 1 View  Result Date: 02/03/2020 CLINICAL DATA:  Leg swelling EXAM: PORTABLE CHEST 1 VIEW COMPARISON:  January 28, 2020 FINDINGS: The heart size and mediastinal contours are unchanged with cardiomegaly. A left-sided pacemaker again noted. There is diffusely increased interstitial markings throughout both  lungs. No definite pleural effusion. No acute osseous abnormality. IMPRESSION: Findings which may be suggestive of interstitial edema. Electronically Signed   By: Prudencio Pair M.D.   On: 02/03/2020 02:49    EKG: Independently reviewed.  A. fib, LPFB, RBBB, QTC 542.  Assessment/Plan Principal Problem:   CHF exacerbation (HCC) Active Problems:   Essential hypertension   Elevated troponin   Acute on chronic kidney failure (HCC)   Acute on chronic respiratory failure with hypoxia (HCC)   Acute on chronic systolic CHF/ischemic dilated cardiomyopathy: Status post AICD.  EF 25 to 30% on recent echo done 01/27/2020.  Appears volume overloaded on exam.  BNP significantly elevated at 1118.  Chest x-ray showing pulmonary edema.. -Patient received IV Lasix 40 mg in the ED.  Continue diuresis with IV Lasix 40 mg twice daily.  Monitor intake and output, daily weights, and low-sodium diet with fluid restriction.  Consult heart failure team in the morning.  AKI on CKD stage IV: Likely cardiorenal from acutely decompensated CHF.  Baseline creatinine 1.4-1.8.  Creatinine currently 3.0, stable since hospital discharge 2 days ago. -Continue Lasix for diuresis and monitor renal function very closely.  Monitor urine output.  Avoid nephrotoxic agents/contrast.  Acute on chronic hypoxemic respiratory failure: Uses 2 L home oxygen.  Sats in the 70s in the ED.  Currently satting well on BiPAP. -Continuous pulse ox, continue BiPAP and wean as tolerated.  ABG ordered.  Left lower extremity erythema: Left lower extremity appears erythematous and warm to touch. ?Cellulitis vs DVT. -Start ceftriaxone.  Doppler ordered to rule out DVT.  Paroxysmal A. fib: Currently rate controlled. -Appears to be on amiodarone, metoprolol, and Eliquis.  Resume home meds after pharmacy med rec is done.  Mild troponin elevation CAD Mild troponin elevation likely due to demand ischemia in the setting of acutely decompensated CHF.   High-sensitivity troponin 40.  Unable to obtain any history from patient at this time. -Cardiac monitoring, trend troponin  QT prolongation  on EKG -Cardiac monitoring.  Keep potassium above 4 and magnesium above 2.  Avoid QT prolonging drugs if possible.  Repeat EKG in a.m.  Hypertension: Currently normotensive. -Resume home meds after pharmacy med rec is done.  Hyperlipidemia -Resume statin after pharmacy med rec is done.  Insulin-dependent type 2 diabetes -Check A1c.  Sliding scale insulin moderate ACHS. Resume home basal insulin after pharmacy med rec is done.  DVT prophylaxis: Subcutaneous heparin Code Status: Full code Family Communication: No family available at this time. Disposition Plan: Status is: Inpatient  Remains inpatient appropriate because:IV treatments appropriate due to intensity of illness or inability to take PO, Inpatient level of care appropriate due to severity of illness and Needs IV diuresis for volume overload/decompensated CHF   Dispo: The patient is from: Home              Anticipated d/c is to: Home              Anticipated d/c date is: > 3 days              Patient currently is not medically stable to d/c.  The medical decision making on this patient was of high complexity and the patient is at high risk for clinical deterioration, therefore this is a level 3 visit.  Shela Leff MD [facility]  If 7PM-7AM, please contact night-coverage [email or web address]  02/03/2020, 4:24 AM

## 2020-02-03 NOTE — Progress Notes (Signed)
RT transported pt from ED to 3E27 on BiPAP through V60. Pt tolerated well.

## 2020-02-03 NOTE — ED Notes (Signed)
Ordered breakfast 

## 2020-02-03 NOTE — Progress Notes (Signed)
Left lower extremity venous study completed.   Results given to RN.   Please see CV Proc for preliminary results.   Vonzell Schlatter, RVT

## 2020-02-04 DIAGNOSIS — I10 Essential (primary) hypertension: Secondary | ICD-10-CM

## 2020-02-04 DIAGNOSIS — N179 Acute kidney failure, unspecified: Secondary | ICD-10-CM | POA: Diagnosis not present

## 2020-02-04 DIAGNOSIS — J9601 Acute respiratory failure with hypoxia: Secondary | ICD-10-CM | POA: Diagnosis not present

## 2020-02-04 DIAGNOSIS — N1832 Chronic kidney disease, stage 3b: Secondary | ICD-10-CM

## 2020-02-04 DIAGNOSIS — I5043 Acute on chronic combined systolic (congestive) and diastolic (congestive) heart failure: Secondary | ICD-10-CM

## 2020-02-04 DIAGNOSIS — R778 Other specified abnormalities of plasma proteins: Secondary | ICD-10-CM

## 2020-02-04 DIAGNOSIS — J9621 Acute and chronic respiratory failure with hypoxia: Secondary | ICD-10-CM | POA: Diagnosis not present

## 2020-02-04 DIAGNOSIS — L8990 Pressure ulcer of unspecified site, unspecified stage: Secondary | ICD-10-CM | POA: Insufficient documentation

## 2020-02-04 LAB — BASIC METABOLIC PANEL
Anion gap: 13 (ref 5–15)
BUN: 86 mg/dL — ABNORMAL HIGH (ref 8–23)
CO2: 27 mmol/L (ref 22–32)
Calcium: 8.6 mg/dL — ABNORMAL LOW (ref 8.9–10.3)
Chloride: 98 mmol/L (ref 98–111)
Creatinine, Ser: 2.85 mg/dL — ABNORMAL HIGH (ref 0.44–1.00)
GFR, Estimated: 16 mL/min — ABNORMAL LOW (ref >60)
Glucose, Bld: 127 mg/dL — ABNORMAL HIGH (ref 70–99)
Potassium: 4.2 mmol/L (ref 3.5–5.1)
Sodium: 138 mmol/L (ref 135–145)

## 2020-02-04 LAB — GLUCOSE, CAPILLARY
Glucose-Capillary: 151 mg/dL — ABNORMAL HIGH (ref 70–99)
Glucose-Capillary: 214 mg/dL — ABNORMAL HIGH (ref 70–99)
Glucose-Capillary: 307 mg/dL — ABNORMAL HIGH (ref 70–99)
Glucose-Capillary: 167 mg/dL — ABNORMAL HIGH (ref 70–99)

## 2020-02-04 LAB — CBC WITH DIFFERENTIAL/PLATELET
Abs Immature Granulocytes: 0.08 10*3/uL — ABNORMAL HIGH (ref 0.00–0.07)
Basophils Absolute: 0 10*3/uL (ref 0.0–0.1)
Basophils Relative: 0 %
Eosinophils Absolute: 0 10*3/uL (ref 0.0–0.5)
Eosinophils Relative: 0 %
HCT: 34.1 % — ABNORMAL LOW (ref 36.0–46.0)
Hemoglobin: 10.8 g/dL — ABNORMAL LOW (ref 12.0–15.0)
Immature Granulocytes: 1 %
Lymphocytes Relative: 11 %
Lymphs Abs: 1.3 10*3/uL (ref 0.7–4.0)
MCH: 28.8 pg (ref 26.0–34.0)
MCHC: 31.7 g/dL (ref 30.0–36.0)
MCV: 90.9 fL (ref 80.0–100.0)
Monocytes Absolute: 1.1 10*3/uL — ABNORMAL HIGH (ref 0.1–1.0)
Monocytes Relative: 9 %
Neutro Abs: 9.7 10*3/uL — ABNORMAL HIGH (ref 1.7–7.7)
Neutrophils Relative %: 79 %
Platelets: 115 10*3/uL — ABNORMAL LOW (ref 150–400)
RBC: 3.75 MIL/uL — ABNORMAL LOW (ref 3.87–5.11)
RDW: 17.3 % — ABNORMAL HIGH (ref 11.5–15.5)
WBC: 12.2 10*3/uL — ABNORMAL HIGH (ref 4.0–10.5)
nRBC: 0 % (ref 0.0–0.2)

## 2020-02-04 LAB — PROCALCITONIN: Procalcitonin: 1.44 ng/mL

## 2020-02-04 MED ORDER — APIXABAN 2.5 MG PO TABS
2.5000 mg | ORAL_TABLET | Freq: Two times a day (BID) | ORAL | Status: DC
  Administered 2020-02-04 – 2020-02-14 (×20): 2.5 mg via ORAL
  Filled 2020-02-04 (×21): qty 1

## 2020-02-04 NOTE — Progress Notes (Signed)
BIPAP in room on standby. Pt currently wearing 5L [place], SpO2 100%, RR 25. RT will continue to monitor.

## 2020-02-04 NOTE — Progress Notes (Signed)
Unable to get interpreter via audio or video for patient.  Attempted to call family for translation and unable to get in contact with any listed family members-either numbers disconnected, no answer, and the phone number for the son which indicated it was the wrong number.    [id number]-able to obtain [place] interpreter via [place] difficult communication for patient and only able to communicate a small amount.

## 2020-02-04 NOTE — Progress Notes (Signed)
[facility] PROGRESS NOTE    Progress Note  Lisa [name]  NGE:[id number] DOB: 22-Apr-1935 DOA: 02/03/2020 PCP: Colonel Bald, MD     Brief Narrative:   Lisa [name] is an 85 y.o. female non-English-speaking past medical history significant for paroxysmal atrial fibrillation on Xarelto, CAD, ischemic dilated cardiomyopathy with an EF of 25% status post AICD chronic respiratory failure on 2 L of oxygen chronic kidney disease stage IV insulin-dependent diabetes mellitus type 2 coronary artery disease presents via EMS for lower extremity edema.  Chest x-ray was done that showed pulmonary edema SARS-CoV-2 PCR was negative.  Assessment/Plan:   New onset of feve possibly due to left lower extremity cellulitis: With a left lower extremity swelling erythematous and warm to touch. She was started empirically on Rocephin, she has remained afebrile leukocytosis improved.  With an elevated procalcitonin.  We will continue antibiotics. Culture data has remained negative till date. Lower extremity Doppler to rule out DVT is pending likely to be negative. Continue use Tylenol for fevers.  Acute respiratory failure with hypoxia due to Acute on chronic systolic heart failure with an EF of 25%: 2D echo was done on 01/27/2020 that showed an EF of 25%. Chest x-ray showed bilateral infiltrates with a BNP of 1100. She still has lower extremity edema and bilateral lower crackles on lower lobes on physical exam she was started on IV Lasix I's and O's are poorly recorded.  Cannot appreciate JVD due to body habitus. She received Lasix this morning we will hold it for tonight check a basic metabolic panel tomorrow. Monitor electrolytes and replete as needed. Repeated basic metabolic panel shows an unchanged creatinine.  Her blood pressure is holding steady. Now on 2 L of oxygen. Continue strict I's and O's and daily weights.  Acute kidney injury on chronic kidney disease stage IV: With a  baseline creatinine 1.4-1.8.  Mild rise in creatinine 2.8 hold Lasix.  Continue strict I's and O's and daily weights.  Paroxysmal atrial fibrillation: Currently rate controlled on amiodarone and metoprolol. Continue Eliquis.  Elevated troponins: Likely demand ischemia in the setting of heart failure the cardiac biomarkers have remained flat.  Essential hypertension: Relatively well-controlled continue current regimen.  Insulin-dependent diabetes mellitus type 2: With an A1c of 7.1 continue sliding scale insulin and long-acting insulin.  Ethics/goals of care: I have been unsuccessful to contact the family has tried several family members including the son who picked up the phone and as soon as I identified myself he hung up and never picked up again. The patient is a non-English speaking somnolent will try again later.   DVT prophylaxis: lovenox Family Communication:unable to contact Status is: Inpatient  Remains inpatient appropriate because:Hemodynamically unstable   Dispo: The patient is from: Home              Anticipated d/c is to: SNF              Anticipated d/c date is: > 3 days              Patient currently is not medically stable to d/c.        Code Status:     Code Status Orders  (From admission, onward)         Start     Ordered   02/03/20 0431  Full code  Continuous        02/03/20 0431        Code Status History    Date Active Date Inactive Code  Status Order ID Comments User Context   10/24/2015 1835 11/14/2015 1941 Full Code [id number]  Ozzie Hoyle Inpatient   09/17/2015 2132 09/25/2015 1839 Full Code [id number]  Toy Baker, MD Inpatient   08/23/2014 2100 08/28/2014 1546 Full Code [id number]  Isaiah Serge, NP Inpatient   Advance Care Planning Activity        IV Access:    Peripheral IV   Procedures and diagnostic studies:   DG Chest Portable 1 View  Result Date: 02/03/2020 CLINICAL DATA:  Leg swelling EXAM: PORTABLE  CHEST 1 VIEW COMPARISON:  January 28, 2020 FINDINGS: The heart size and mediastinal contours are unchanged with cardiomegaly. A left-sided pacemaker again noted. There is diffusely increased interstitial markings throughout both lungs. No definite pleural effusion. No acute osseous abnormality. IMPRESSION: Findings which may be suggestive of interstitial edema. Electronically Signed   By: Prudencio Pair M.D.   On: 02/03/2020 02:49   VAS [place] LOWER EXTREMITY VENOUS (DVT)  Result Date: 02/03/2020  Lower Venous DVT Study Indications: Swelling, and Pain.  Limitations: Patient started kicking when calf was touched assumed to be the rash and pain as a cause. Patient does not speak english. Comparison Study: No previous exam Performing Technologist: Vonzell Schlatter RVT  Examination Guidelines: A complete evaluation includes B-mode imaging, spectral Doppler, color Doppler, and power Doppler as needed of all accessible portions of each vessel. Bilateral testing is considered an integral part of a complete examination. Limited examinations for reoccurring indications may be performed as noted. The reflux portion of the exam is performed with the patient in reverse Trendelenburg.  +---------+---------------+---------+-----------+----------+--------------+ LEFT     CompressibilityPhasicitySpontaneityPropertiesThrombus Aging +---------+---------------+---------+-----------+----------+--------------+ CFV      Full                                                        +---------+---------------+---------+-----------+----------+--------------+ SFJ      Full                                                        +---------+---------------+---------+-----------+----------+--------------+ FV Prox  Full                                                        +---------+---------------+---------+-----------+----------+--------------+ FV Mid   Full                                                         +---------+---------------+---------+-----------+----------+--------------+ FV DistalFull                                                        +---------+---------------+---------+-----------+----------+--------------+ PFV      Full                                                        +---------+---------------+---------+-----------+----------+--------------+  POP      Full                                                        +---------+---------------+---------+-----------+----------+--------------+   Left Technical Findings: Not visualized segments include PTV, PerV.   Summary: LEFT: - There is no evidence of deep vein thrombosis in the lower extremity. However, portions of this examination were limited- see technologist comments above.  - No cystic structure found in the popliteal fossa.  *See table(s) above for measurements and observations.    Preliminary      Medical Consultants:    None.  Anti-Infectives:   rocephin  Subjective:    Lisa [name] awake this morning complaining of pain in her left lower extremity  Objective:    Vitals:   02/03/20 1922 02/03/20 2200 02/04/20 0000 02/04/20 0422  BP: (!) 90/52 (!) 103/55 (!) 105/54 105/88  Pulse: 77 85 60 60  Resp: (!) 21 20 16 20   Temp: 97.8 F (36.6 C)  98.7 F (37.1 C) 98 F (36.7 C)  TempSrc:   Oral Oral  SpO2: 100% 100% 100% 94%  Weight:    72 kg   SpO2: 94 % O2 Flow Rate (L/min): 2 L/min FiO2 (%): 40 %   Intake/Output Summary (Last 24 hours) at 02/04/2020 0813 Last data filed at 02/04/2020 0424 Gross per 24 hour  Intake 600 ml  Output 825 ml  Net -225 ml   Filed Weights   02/04/20 0422  Weight: 72 kg    Exam: General exam: In no acute distress. Respiratory system: Good air movement and crackles at bases bilaterally Cardiovascular system: S1 & S2 heard, RRR. No JVD. Gastrointestinal system: Abdomen is nondistended, soft and nontender.  Extremities: Asymmetric edema Skin: No  rashes, lesions or ulcers  Data Reviewed:    Labs: Basic Metabolic Panel: Recent Labs  Lab 02/03/20 0219 02/03/20 0304 02/03/20 0305 02/03/20 0535 02/04/20 0152  NA 137 136 136 136 138  K 4.8 4.7 4.7 4.7 4.2  CL 96*  --  96*  --  98  CO2 26  --   --   --  27  GLUCOSE 155*  --  151*  --  127*  BUN 87*  --  98*  --  86*  CREATININE 3.03*  --  2.70*  --  2.85*  CALCIUM 8.9  --   --   --  8.6*  MG 2.3  --   --   --   --    GFR CrCl cannot be calculated (Unknown ideal weight.). Liver Function Tests: Recent Labs  Lab 02/03/20 0219  AST 25  ALT 17  ALKPHOS 59  BILITOT 1.1  PROT 6.9  ALBUMIN 3.9   No results for input(s): LIPASE, AMYLASE in the last 168 hours. No results for input(s): AMMONIA in the last 168 hours. Coagulation profile No results for input(s): INR, PROTIME in the last 168 hours. COVID-19 Labs  No results for input(s): DDIMER, FERRITIN, LDH, CRP in the last 72 hours.  Lab Results  Component Value Date   [place] NEGATIVE 02/03/2020    CBC: Recent Labs  Lab 02/03/20 0219 02/03/20 0304 02/03/20 0305 02/03/20 0535 02/03/20 1539 02/04/20 0152  WBC 10.3  --   --   --  15.8* 12.2*  NEUTROABS  --   --   --   --  13.4* 9.7*  HGB 11.7* 13.6 13.6 12.2 11.3* 10.8*  HCT 39.8 40.0 40.0 36.0 37.9 34.1*  MCV 93.2  --   --   --  91.1 90.9  PLT 176  --   --   --  117* 115*   Cardiac Enzymes: No results for input(s): CKTOTAL, CKMB, CKMBINDEX, TROPONINI in the last 168 hours. BNP (last 3 results) No results for input(s): PROBNP in the last 8760 hours. CBG: Recent Labs  Lab 02/03/20 0800 02/03/20 1232 02/03/20 1606 02/03/20 2119 02/04/20 0614  GLUCAP 202* 184* 148* 95 151*   D-Dimer: No results for input(s): DDIMER in the last 72 hours. Hgb A1c: Recent Labs    02/03/20 0508  HGBA1C 6.5*   Lipid Profile: No results for input(s): CHOL, HDL, LDLCALC, TRIG, CHOLHDL, LDLDIRECT in the last 72 hours. Thyroid function studies: No results for  input(s): TSH, T4TOTAL, T3FREE, THYROIDAB in the last 72 hours.  Invalid input(s): FREET3 Anemia work up: No results for input(s): VITAMINB12, FOLATE, FERRITIN, TIBC, IRON, RETICCTPCT in the last 72 hours. Sepsis Labs: Recent Labs  Lab 02/03/20 0219 02/03/20 0423 02/03/20 1539 02/04/20 0152  PROCALCITON  --  0.23  --  1.44  WBC 10.3  --  15.8* 12.2*   Microbiology Recent Results (from the past 240 hour(s))  Resp Panel by RT-PCR (Flu A&B, Covid) Nasopharyngeal Swab     Status: None   Collection Time: 02/03/20  2:25 AM   Specimen: Nasopharyngeal Swab; Nasopharyngeal(NP) swabs in vial transport medium  Result Value Ref Range Status   SARS Coronavirus 2 by RT PCR NEGATIVE NEGATIVE Final    Comment: (NOTE) SARS-CoV-2 target nucleic acids are NOT DETECTED.  The SARS-CoV-2 RNA is generally detectable in upper respiratory specimens during the acute phase of infection. The lowest concentration of SARS-CoV-2 viral copies this assay can detect is 138 copies/mL. A negative result does not preclude SARS-Cov-2 infection and should not be used as the sole basis for treatment or other patient management decisions. A negative result may occur with  improper specimen collection/handling, submission of specimen other than nasopharyngeal swab, presence of viral mutation(s) within the areas targeted by this assay, and inadequate number of viral copies(<138 copies/mL). A negative result must be combined with clinical observations, patient history, and epidemiological information. The expected result is Negative.  Fact Sheet for Patients:  EntrepreneurPulse.com.au  Fact Sheet for Healthcare Providers:  IncredibleEmployment.be  This test is no t yet approved or cleared by the [place] FDA and  has been authorized for detection and/or diagnosis of SARS-CoV-2 by FDA under an Emergency Use Authorization (EUA). This EUA will remain  in effect (meaning this  test can be used) for the duration of the COVID-19 declaration under Section 564(b)(1) of the Act, 21 U.S.C.section 360bbb-3(b)(1), unless the authorization is terminated  or revoked sooner.       Influenza A by PCR NEGATIVE NEGATIVE Final   Influenza B by PCR NEGATIVE NEGATIVE Final    Comment: (NOTE) The Xpert Xpress SARS-CoV-2/FLU/RSV plus assay is intended as an aid in the diagnosis of influenza from Nasopharyngeal swab specimens and should not be used as a sole basis for treatment. Nasal washings and aspirates are unacceptable for Xpert Xpress SARS-CoV-2/FLU/RSV testing.  Fact Sheet for Patients: EntrepreneurPulse.com.au  Fact Sheet for Healthcare Providers: IncredibleEmployment.be  This test is not yet approved or cleared by the [place] FDA and has been authorized for detection and/or diagnosis of SARS-CoV-2 by FDA under an Emergency Use Authorization (EUA). This  EUA will remain in effect (meaning this test can be used) for the duration of the COVID-19 declaration under Section 564(b)(1) of the Act, 21 U.S.C. section 360bbb-3(b)(1), unless the authorization is terminated or revoked.  Performed at [facility], [place] [address], [place], [place] [address]      Medications:   . chlorhexidine  15 mL Mouth Rinse BID  . furosemide  80 mg Intravenous BID  . heparin  5,000 Units Subcutaneous Q8H  . insulin aspart  0-15 Units Subcutaneous TID WC  . insulin aspart  0-5 Units Subcutaneous QHS  . mouth rinse  15 mL Mouth Rinse q12n4p   Continuous Infusions: . cefTRIAXone (ROCEPHIN)  IV 1 g (02/04/20 0535)      LOS: 1 day   Charlynne Cousins  [facility]  02/04/2020, 8:13 AM

## 2020-02-05 DIAGNOSIS — J9621 Acute and chronic respiratory failure with hypoxia: Secondary | ICD-10-CM | POA: Diagnosis not present

## 2020-02-05 DIAGNOSIS — I5043 Acute on chronic combined systolic (congestive) and diastolic (congestive) heart failure: Secondary | ICD-10-CM | POA: Diagnosis not present

## 2020-02-05 DIAGNOSIS — N179 Acute kidney failure, unspecified: Secondary | ICD-10-CM | POA: Diagnosis not present

## 2020-02-05 DIAGNOSIS — J9601 Acute respiratory failure with hypoxia: Secondary | ICD-10-CM | POA: Diagnosis not present

## 2020-02-05 LAB — BASIC METABOLIC PANEL
Anion gap: 12 (ref 5–15)
BUN: 96 mg/dL — ABNORMAL HIGH (ref 8–23)
CO2: 29 mmol/L (ref 22–32)
Calcium: 8.5 mg/dL — ABNORMAL LOW (ref 8.9–10.3)
Chloride: 95 mmol/L — ABNORMAL LOW (ref 98–111)
Creatinine, Ser: 3.26 mg/dL — ABNORMAL HIGH (ref 0.44–1.00)
GFR, Estimated: 14 mL/min — ABNORMAL LOW (ref >60)
Glucose, Bld: 109 mg/dL — ABNORMAL HIGH (ref 70–99)
Potassium: 4.2 mmol/L (ref 3.5–5.1)
Sodium: 136 mmol/L (ref 135–145)

## 2020-02-05 LAB — GLUCOSE, CAPILLARY
Glucose-Capillary: 126 mg/dL — ABNORMAL HIGH (ref 70–99)
Glucose-Capillary: 107 mg/dL — ABNORMAL HIGH (ref 70–99)
Glucose-Capillary: 184 mg/dL — ABNORMAL HIGH (ref 70–99)
Glucose-Capillary: 233 mg/dL — ABNORMAL HIGH (ref 70–99)

## 2020-02-05 LAB — BRAIN NATRIURETIC PEPTIDE: B Natriuretic Peptide: 1233 pg/mL — ABNORMAL HIGH (ref 0.0–100.0)

## 2020-02-05 MED ORDER — SODIUM CHLORIDE 0.9 % IV SOLN: INTRAVENOUS | Status: AC

## 2020-02-05 MED ORDER — SODIUM CHLORIDE 0.9 % IV BOLUS
500.0000 mL | Freq: Once | INTRAVENOUS | Status: AC
  Administered 2020-02-05: 500 mL via INTRAVENOUS

## 2020-02-05 NOTE — Progress Notes (Signed)
[facility] PROGRESS NOTE    Progress Note  Jaonna Word  SAY:[id number] DOB: Sep 15, 1935 DOA: 02/03/2020 PCP: Colonel Bald, MD     Brief Narrative:   Lavaughn Bisig is an 85 y.o. female non-English-speaking past medical history significant for paroxysmal atrial fibrillation on Xarelto, CAD, ischemic dilated cardiomyopathy with an EF of 25% status post AICD chronic respiratory failure on 2 L of oxygen chronic kidney disease stage IV insulin-dependent diabetes mellitus type 2 coronary artery disease presents via EMS for lower extremity edema.  Chest x-ray was done that showed pulmonary edema SARS-CoV-2 PCR was negative.  Assessment/Plan:   New onset of feve possibly due to left lower extremity cellulitis: With a left lower extremity swelling erythematous and warm to touch. Continue IV Rocephin, has remained afebrile leukocytosis improved With an elevated procalcitonin.  We will continue antibiotics. Culture data has remained negative till date.   Lower extremity Doppler to rule out DVT is pending likely to be negative. Continue use Tylenol for fevers. There is a third area of try to obtain a [place] translator through the iPad and have been accessible.  Acute respiratory failure with hypoxia due to Acute on chronic systolic heart failure with an EF of 25%: 2D echo was done on 01/27/2020 that showed an EF of 25%. Chest x-ray showed bilateral infiltrates with a BNP of 1100. Lasix was held on 02/04/2019, despite this her creatinine continues to rise, will go ahead and give a bolus of normal saline. Now on 2 L of oxygen. Continue strict I's and O's and daily weights.  Acute kidney injury on chronic kidney disease stage IV: With a baseline creatinine 1.4-1.8.  Creatinine continues to rise despite holding Lasix will give a bolus of normal saline, continue at 75 cc an hour for 12 hours monitor strict I's and O's and daily weights.  Paroxysmal atrial  fibrillation: Currently rate controlled on amiodarone and metoprolol. Continue Eliquis.  Elevated troponins: Likely demand ischemia in the setting of heart failure the cardiac biomarkers have remained flat.  Essential hypertension: Relatively well-controlled continue current regimen.  Insulin-dependent diabetes mellitus type 2: With an A1c of 7.1 continue sliding scale insulin and long-acting insulin.  Ethics/goals of care: I have been unsuccessful to contact the family has tried several family members including the son who picked up the phone and as soon as I identified myself he hung up and never picked up again. The patient is a non-English speaking somnolent will try again later.   DVT prophylaxis: lovenox Family Communication:unable to contact Status is: Inpatient  Remains inpatient appropriate because:Hemodynamically unstable   Dispo: The patient is from: Home              Anticipated d/c is to: SNF              Anticipated d/c date is: > 3 days              Patient currently is not medically stable to d/c.        Code Status:     Code Status Orders  (From admission, onward)         Start     Ordered   02/03/20 0431  Full code  Continuous        02/03/20 0431        Code Status History    Date Active Date Inactive Code Status Order ID Comments User Context   10/24/2015 1835 11/14/2015 1941 Full Code [id number]  [place], [place]  09/17/2015 2132 09/25/2015 1839 Full Code [id number]  Toy Baker, MD Inpatient   08/23/2014 2100 08/28/2014 1546 Full Code [id number]  Isaiah Serge, NP Inpatient   Advance Care Planning Activity        IV Access:    Peripheral IV   Procedures and diagnostic studies:   No results found.   Medical Consultants:    None.  Anti-Infectives:   rocephin  Subjective:    Zaryiah Raybourn relates she still has left lower extremity pain  Objective:    Vitals:   02/05/20 0000 02/05/20 0322  02/05/20 0416 02/05/20 0717  BP:   102/62 105/68  Pulse:   82 86  Resp: 19  (!) 22 19  Temp: 98.3 F (36.8 C)  97.8 F (36.6 C) 97.7 F (36.5 C)  TempSrc: Axillary  Axillary Oral  SpO2:   100% 100%  Weight:  76.3 kg    Height:       SpO2: 100 % O2 Flow Rate (L/min): 4 L/min FiO2 (%): 40 %   Intake/Output Summary (Last 24 hours) at 02/05/2020 1039 Last data filed at 02/05/2020 0837 Gross per 24 hour  Intake 480 ml  Output --  Net 480 ml   Filed Weights   02/04/20 0422 02/05/20 0322  Weight: 72 kg 76.3 kg    Exam: General exam: In no acute distress. Respiratory system: Good air movement and clear to auscultation. Cardiovascular system: S1 & S2 heard, RRR. No JVD. Gastrointestinal system: Abdomen is nondistended, soft and nontender.  Extremities: Asymmetric edema. Skin: No rashes, lesions or ulcers  Data Reviewed:    Labs: Basic Metabolic Panel: Recent Labs  Lab 02/03/20 0219 02/03/20 0304 02/03/20 0305 02/03/20 0535 02/04/20 0152 02/05/20 0312  NA 137 136 136 136 138 136  K 4.8 4.7 4.7 4.7 4.2 4.2  CL 96*  --  96*  --  98 95*  CO2 26  --   --   --  27 29  GLUCOSE 155*  --  151*  --  127* 109*  BUN 87*  --  98*  --  86* 96*  CREATININE 3.03*  --  2.70*  --  2.85* 3.26*  CALCIUM 8.9  --   --   --  8.6* 8.5*  MG 2.3  --   --   --   --   --    GFR Estimated Creatinine Clearance: 13.3 mL/min (A) (by C-G formula based on SCr of 3.26 mg/dL (H)). Liver Function Tests: Recent Labs  Lab 02/03/20 0219  AST 25  ALT 17  ALKPHOS 59  BILITOT 1.1  PROT 6.9  ALBUMIN 3.9   No results for input(s): LIPASE, AMYLASE in the last 168 hours. No results for input(s): AMMONIA in the last 168 hours. Coagulation profile No results for input(s): INR, PROTIME in the last 168 hours. COVID-19 Labs  No results for input(s): DDIMER, FERRITIN, LDH, CRP in the last 72 hours.  Lab Results  Component Value Date   [place] NEGATIVE 02/03/2020    CBC: Recent Labs  Lab  02/03/20 0219 02/03/20 0304 02/03/20 0305 02/03/20 0535 02/03/20 1539 02/04/20 0152  WBC 10.3  --   --   --  15.8* 12.2*  NEUTROABS  --   --   --   --  13.4* 9.7*  HGB 11.7* 13.6 13.6 12.2 11.3* 10.8*  HCT 39.8 40.0 40.0 36.0 37.9 34.1*  MCV 93.2  --   --   --  91.1 90.9  PLT 176  --   --   --  117* 115*   Cardiac Enzymes: No results for input(s): CKTOTAL, CKMB, CKMBINDEX, TROPONINI in the last 168 hours. BNP (last 3 results) No results for input(s): PROBNP in the last 8760 hours. CBG: Recent Labs  Lab 02/04/20 0614 02/04/20 1120 02/04/20 1643 02/04/20 2110 02/05/20 0614  GLUCAP 151* 214* 307* 167* 126*   D-Dimer: No results for input(s): DDIMER in the last 72 hours. Hgb A1c: Recent Labs    02/03/20 0508  HGBA1C 6.5*   Lipid Profile: No results for input(s): CHOL, HDL, LDLCALC, TRIG, CHOLHDL, LDLDIRECT in the last 72 hours. Thyroid function studies: No results for input(s): TSH, T4TOTAL, T3FREE, THYROIDAB in the last 72 hours.  Invalid input(s): FREET3 Anemia work up: No results for input(s): VITAMINB12, FOLATE, FERRITIN, TIBC, IRON, RETICCTPCT in the last 72 hours. Sepsis Labs: Recent Labs  Lab 02/03/20 0219 02/03/20 0423 02/03/20 1539 02/04/20 0152  PROCALCITON  --  0.23  --  1.44  WBC 10.3  --  15.8* 12.2*   Microbiology Recent Results (from the past 240 hour(s))  Resp Panel by RT-PCR (Flu A&B, Covid) Nasopharyngeal Swab     Status: None   Collection Time: 02/03/20  2:25 AM   Specimen: Nasopharyngeal Swab; Nasopharyngeal(NP) swabs in vial transport medium  Result Value Ref Range Status   SARS Coronavirus 2 by RT PCR NEGATIVE NEGATIVE Final    Comment: (NOTE) SARS-CoV-2 target nucleic acids are NOT DETECTED.  The SARS-CoV-2 RNA is generally detectable in upper respiratory specimens during the acute phase of infection. The lowest concentration of SARS-CoV-2 viral copies this assay can detect is 138 copies/mL. A negative result does not preclude  SARS-Cov-2 infection and should not be used as the sole basis for treatment or other patient management decisions. A negative result may occur with  improper specimen collection/handling, submission of specimen other than nasopharyngeal swab, presence of viral mutation(s) within the areas targeted by this assay, and inadequate number of viral copies(<138 copies/mL). A negative result must be combined with clinical observations, patient history, and epidemiological information. The expected result is Negative.  Fact Sheet for Patients:  EntrepreneurPulse.com.au  Fact Sheet for Healthcare Providers:  IncredibleEmployment.be  This test is no t yet approved or cleared by the [place] FDA and  has been authorized for detection and/or diagnosis of SARS-CoV-2 by FDA under an Emergency Use Authorization (EUA). This EUA will remain  in effect (meaning this test can be used) for the duration of the COVID-19 declaration under Section 564(b)(1) of the Act, 21 U.S.C.section 360bbb-3(b)(1), unless the authorization is terminated  or revoked sooner.       Influenza A by PCR NEGATIVE NEGATIVE Final   Influenza B by PCR NEGATIVE NEGATIVE Final    Comment: (NOTE) The Xpert Xpress SARS-CoV-2/FLU/RSV plus assay is intended as an aid in the diagnosis of influenza from Nasopharyngeal swab specimens and should not be used as a sole basis for treatment. Nasal washings and aspirates are unacceptable for Xpert Xpress SARS-CoV-2/FLU/RSV testing.  Fact Sheet for Patients: EntrepreneurPulse.com.au  Fact Sheet for Healthcare Providers: IncredibleEmployment.be  This test is not yet approved or cleared by the [place] FDA and has been authorized for detection and/or diagnosis of SARS-CoV-2 by FDA under an Emergency Use Authorization (EUA). This EUA will remain in effect (meaning this test can be used) for the duration of  the COVID-19 declaration under Section 564(b)(1) of the Act, 21 U.S.C. section 360bbb-3(b)(1), unless the authorization is terminated or revoked.  Performed at [facility], [place]  [address], [place], [place] [address]      Medications:   . apixaban  2.5 mg Oral BID  . chlorhexidine  15 mL Mouth Rinse BID  . insulin aspart  0-15 Units Subcutaneous TID WC  . insulin aspart  0-5 Units Subcutaneous QHS  . mouth rinse  15 mL Mouth Rinse q12n4p   Continuous Infusions: . cefTRIAXone (ROCEPHIN)  IV 1 g (02/05/20 0418)      LOS: 2 days   Charlynne Cousins  [facility]  02/05/2020, 10:39 AM

## 2020-02-05 NOTE — Evaluation (Signed)
Physical Therapy Evaluation Patient Details Name: Lisa [name] MRN: [id number] DOB: 05/02/1935 Today's Date: 02/05/2020   History of Present Illness  85 y.o. female non-English-speaking past medical history significant for paroxysmal atrial fibrillation on Xarelto, CAD, ischemic dilated cardiomyopathy with an EF of 25% status post AICD chronic respiratory failure on 2 L of oxygen chronic kidney disease stage IV insulin-dependent diabetes mellitus type 2 coronary artery disease presents via EMS for lower extremity edema.  Chest x-ray was done that showed pulmonary edema.  Clinical Impression  Pt presents to PT with deficits in functional mobility, strength, power, gait, balance, endurance. Difficult to discern pt's baseline due to language barrier despite attempted use of audio interpreter services. Pt reports the desire to lay down, PT assists with transfer and bed mobility with which the pt requires physical assistance to perform due to weakness. Pt also reports SOB during session when lying flat in supine. Pt will benefit from acute PT services to improve LE strength and functional mobility quality. PT recommends SNF placement at this time as the pt is at a high risk for falls and demonstrates very limited activity tolerance.    Follow Up Recommendations SNF;Supervision/Assistance - 24 hour    Equipment Recommendations  Wheelchair (measurements PT);Wheelchair cushion (measurements PT);Hospital bed    Recommendations for Other Services       Precautions / Restrictions Precautions Precautions: Fall Precaution Comments: monitor SpO2 Restrictions Weight Bearing Restrictions: No      Mobility  Bed Mobility Overal bed mobility: Needs Assistance Bed Mobility: Rolling;Sit to Supine Rolling: Min assist     Sit to supine: Min assist        Transfers Overall transfer level: Needs assistance Equipment used: 1 person hand held assist Transfers: Sit to/from [employer] Sit to Stand: Mod assist Stand pivot transfers: Mod assist       General transfer comment: PT initiates movement due to difficulty communicating. Pt requires assistance to power into standing  Ambulation/Gait                Stairs            Wheelchair Mobility    Modified Rankin (Stroke Patients Only)       Balance Overall balance assessment: Needs assistance Sitting-balance support: Single extremity supported;Bilateral upper extremity supported;Feet supported Sitting balance-Leahy Scale: Poor Sitting balance - Comments: reliant on UE support of bed or minG   Standing balance support: Bilateral upper extremity supported Standing balance-Leahy Scale: Poor Standing balance comment: modA with BUE support of PT hand hold                             Pertinent Vitals/Pain Pain Assessment: Faces Faces Pain Scale: Hurts whole lot Pain Location: L calf or lower leg to touch Pain Descriptors / Indicators: Grimacing Pain Intervention(s): Monitored during session    Home Living Family/patient expects to be discharged to:: Other (Comment) (unable to determine, difficulty utilizing interpreter services, no family present)                      Prior Function           Comments: unable to determine     Hand Dominance        Extremity/Trunk Assessment   Upper Extremity Assessment Upper Extremity Assessment: Generalized weakness    Lower Extremity Assessment Lower Extremity Assessment: Generalized weakness;RLE deficits/detail;LLE deficits/detail RLE Deficits / Details: generalized LE weakness, RLE  pitting edema LLE Deficits / Details: generalized weakness, pitting edema with significant redness and weeping. Painful to light touch    Cervical / Trunk Assessment Cervical / Trunk Assessment: Other exceptions Cervical / Trunk Exceptions: excess body habitus  Communication   Communication: Prefers language other than English  (serbian, audio interpreter utilized. Interpreter reports "it seems as if the patient does not want to communicate with me. She does not respond to me")  Cognition Arousal/Alertness: Awake/alert Behavior During Therapy: Restless Overall Cognitive Status: Difficult to assess                                        General Comments General comments (skin integrity, edema, etc.): pt on 5L [place], pt reports some SOB when laying flat, HR in 80s during session, SOB seems to resolve with elevation of trunk, SpO2 stable    Exercises     Assessment/Plan    PT Assessment Patient needs continued PT services  PT Problem List Decreased strength;Decreased activity tolerance;Decreased balance;Decreased mobility;Decreased knowledge of use of DME;Decreased safety awareness;Decreased knowledge of precautions;Cardiopulmonary status limiting activity;Pain       PT Treatment Interventions DME instruction;Gait training;Functional mobility training;Therapeutic activities;Therapeutic exercise;Balance training;Neuromuscular re-education;Patient/family education;Wheelchair mobility training    PT Goals (Current goals can be found in the Care Plan section)  Acute Rehab PT Goals Patient Stated Goal: to lay down, unable to discern long term therapy goal PT Goal Formulation: With patient Time For Goal Achievement: 02/19/20 Potential to Achieve Goals: Fair    Frequency Min 2X/week   Barriers to discharge        Co-evaluation               AM-PAC PT "6 Clicks" Mobility  Outcome Measure Help needed turning from your back to your side while in a flat bed without using bedrails?: A Little Help needed moving from lying on your back to sitting on the side of a flat bed without using bedrails?: A Little Help needed moving to and from a bed to a chair (including a wheelchair)?: A Lot Help needed standing up from a chair using your arms (e.g., wheelchair or bedside chair)?: A Lot Help needed to  walk in hospital room?: A Lot Help needed climbing 3-5 steps with a railing? : Total 6 Click Score: 13    End of Session Equipment Utilized During Treatment: Oxygen Activity Tolerance: Other (comment) (limited by faitgue, pain, and language barrier) Patient left: in bed;with call bell/phone within reach;with bed alarm set Nurse Communication: Mobility status PT Visit Diagnosis: Unsteadiness on feet (R26.81);Other abnormalities of gait and mobility (R26.89);Muscle weakness (generalized) (M62.81)    Time: 1410-1433 PT Time Calculation (min) (ACUTE ONLY): 23 min   Charges:   PT Evaluation $PT Eval Moderate Complexity: 1 Mod          Zenaida Niece, PT, DPT Acute Rehabilitation Pager: [phone]   Zenaida Niece 02/05/2020, 2:56 PM

## 2020-02-06 DIAGNOSIS — J9601 Acute respiratory failure with hypoxia: Secondary | ICD-10-CM | POA: Diagnosis not present

## 2020-02-06 DIAGNOSIS — N179 Acute kidney failure, unspecified: Secondary | ICD-10-CM | POA: Diagnosis not present

## 2020-02-06 DIAGNOSIS — I255 Ischemic cardiomyopathy: Secondary | ICD-10-CM

## 2020-02-06 DIAGNOSIS — N184 Chronic kidney disease, stage 4 (severe): Secondary | ICD-10-CM

## 2020-02-06 DIAGNOSIS — I2510 Atherosclerotic heart disease of native coronary artery without angina pectoris: Secondary | ICD-10-CM | POA: Diagnosis not present

## 2020-02-06 DIAGNOSIS — I4821 Permanent atrial fibrillation: Secondary | ICD-10-CM | POA: Diagnosis not present

## 2020-02-06 DIAGNOSIS — J9621 Acute and chronic respiratory failure with hypoxia: Secondary | ICD-10-CM | POA: Diagnosis not present

## 2020-02-06 DIAGNOSIS — I5023 Acute on chronic systolic (congestive) heart failure: Secondary | ICD-10-CM

## 2020-02-06 DIAGNOSIS — I5043 Acute on chronic combined systolic (congestive) and diastolic (congestive) heart failure: Secondary | ICD-10-CM | POA: Diagnosis not present

## 2020-02-06 LAB — GLUCOSE, CAPILLARY
Glucose-Capillary: 184 mg/dL — ABNORMAL HIGH (ref 70–99)
Glucose-Capillary: 194 mg/dL — ABNORMAL HIGH (ref 70–99)
Glucose-Capillary: 163 mg/dL — ABNORMAL HIGH (ref 70–99)
Glucose-Capillary: 165 mg/dL — ABNORMAL HIGH (ref 70–99)

## 2020-02-06 LAB — BASIC METABOLIC PANEL
Anion gap: 17 — ABNORMAL HIGH (ref 5–15)
BUN: 102 mg/dL — ABNORMAL HIGH (ref 8–23)
CO2: 24 mmol/L (ref 22–32)
Calcium: 8.6 mg/dL — ABNORMAL LOW (ref 8.9–10.3)
Chloride: 92 mmol/L — ABNORMAL LOW (ref 98–111)
Creatinine, Ser: 3.46 mg/dL — ABNORMAL HIGH (ref 0.44–1.00)
GFR, Estimated: 13 mL/min — ABNORMAL LOW (ref >60)
Glucose, Bld: 185 mg/dL — ABNORMAL HIGH (ref 70–99)
Potassium: 5 mmol/L (ref 3.5–5.1)
Sodium: 133 mmol/L — ABNORMAL LOW (ref 135–145)

## 2020-02-06 MED ORDER — FUROSEMIDE 10 MG/ML IJ SOLN
80.0000 mg | Freq: Two times a day (BID) | INTRAMUSCULAR | Status: DC
  Administered 2020-02-06 – 2020-02-12 (×12): 80 mg via INTRAVENOUS
  Filled 2020-02-06 (×12): qty 8

## 2020-02-06 MED ORDER — INSULIN DETEMIR 100 UNIT/ML ~~LOC~~ SOLN
5.0000 [IU] | Freq: Two times a day (BID) | SUBCUTANEOUS | Status: DC
  Administered 2020-02-06 – 2020-02-14 (×17): 5 [IU] via SUBCUTANEOUS
  Filled 2020-02-06 (×18): qty 0.05

## 2020-02-06 NOTE — NC FL2 (Signed)
[place] LEVEL OF CARE SCREENING TOOL     IDENTIFICATION  Patient Name: Lisa [name] Birthdate: 02-21-1935 Sex: female Admission Date (Current Location): 02/03/2020  [facility] and [place] Number:  [employer] and Address:  [facility], [place] [address] [place], [place] [address]      Provider Number: [id number]  Attending Physician Name and Address:  Charlynne Cousins, MD  Relative Name and Phone Number:  Dhrithi, Riche    Current Level of Care: Hospital Recommended Level of Care: [place] Prior Approval Number:    Date Approved/Denied: 02/06/20 PASRR Number: Pending  Discharge Plan: SNF    Current Diagnoses: Patient Active Problem List   Diagnosis Date Noted  . Pressure injury of skin 02/04/2020  . CHF exacerbation ([place]) 02/03/2020  . Pericardial effusion with cardiac tamponade post draaijnage 10/17/15 11/07/2015  . Chest tube in place left 11/07/2015  . Acute on chronic respiratory failure with hypoxia ([place])   . Pleural effusion   . AKI (acute kidney injury) ([place]) 09/18/2015  . Sinus pause 09/18/2015  . Liver lesion 09/18/2015  . Leukocytosis 09/17/2015  . Elevated troponin 09/17/2015  . Chest pain 09/17/2015  . Uncontrolled type 2 diabetes mellitus with circulatory disorder, with long-term current use of insulin ([place]) 08/02/2015  . Essential hypertension   . Acute on chronic combined systolic and diastolic heart failure ([place])   . Aortic stenosis due to bicuspid aortic valve   . OSA on CPAP   . Persistent atrial fibrillation ([place])   . CKD (chronic kidney disease), stage III ([place])   . Atrial fibrillation with RVR ([place]) 08/23/2014    Orientation RESPIRATION BLADDER Height & Weight     Self  O2 (5L [place]) External catheter Weight: 176 lb 2.4 oz (79.9 kg) Height:  5\' 4"  (162.6 cm)  BEHAVIORAL SYMPTOMS/MOOD NEUROLOGICAL BOWEL NUTRITION STATUS      Continent    AMBULATORY STATUS COMMUNICATION  OF NEEDS Skin   Extensive Assist Verbally PU Stage and Appropriate Care (sacrum, bilateral)                       Personal Care Assistance Level of Assistance  Bathing,Feeding,Dressing Bathing Assistance: Maximum assistance Feeding assistance: Independent Dressing Assistance: Maximum assistance     Functional Limitations Info  Sight,Hearing,Speech Sight Info: Adequate Hearing Info: Impaired Speech Info: Adequate    SPECIAL CARE FACTORS FREQUENCY  PT (By licensed PT),OT (By licensed OT)     PT Frequency: 5x/week OT Frequency: 5x/week            Contractures Contractures Info: Not present    Additional Factors Info  Allergies,Code Status Code Status Info: Full code Allergies Info: lisinopril           Current Medications (02/06/2020):  This is the current hospital active medication list Current Facility-Administered Medications  Medication Dose Route Frequency Provider Last Rate Last Admin  . acetaminophen (TYLENOL) tablet 650 mg  650 mg Oral Q6H PRN Shela Leff, MD   650 mg at 02/05/20 1727   Or  . acetaminophen (TYLENOL) suppository 650 mg  650 mg Rectal Q6H PRN Shela Leff, MD   650 mg at 02/03/20 0517  . apixaban (ELIQUIS) tablet 2.5 mg  2.5 mg Oral BID Charlynne Cousins, MD   2.5 mg at 02/06/20 1027  . cefTRIAXone (ROCEPHIN) 1 g in sodium chloride 0.9 % 100 mL IVPB  1 g Intravenous Q24H Shela Leff, MD  Stopped at 02/06/20 1359  . chlorhexidine (PERIDEX) 0.12 % solution 15 mL  15 mL Mouth Rinse BID Charlynne Cousins, MD   15 mL at 02/06/20 1027  . furosemide (LASIX) injection 80 mg  80 mg Intravenous BID Bhagat, Bhavinkumar, PA   80 mg at 02/06/20 1404  . insulin aspart (novoLOG) injection 0-15 Units  0-15 Units Subcutaneous TID WC Shela Leff, MD   3 Units at 02/06/20 1149  . insulin aspart (novoLOG) injection 0-5 Units  0-5 Units Subcutaneous QHS Shela Leff, MD   2 Units at 02/05/20 2249  . insulin detemir (LEVEMIR)  injection 5 Units  5 Units Subcutaneous BID Charlynne Cousins, MD   5 Units at 02/06/20 1150  . MEDLINE mouth rinse  15 mL Mouth Rinse q12n4p Charlynne Cousins, MD   15 mL at 02/06/20 1132     Discharge Medications: Please see discharge summary for a list of discharge medications.  Relevant Imaging Results:  Relevant Lab Results:   Additional [place], LCSW

## 2020-02-06 NOTE — Progress Notes (Signed)
[facility] PROGRESS NOTE    Progress Note  Lisa [name]  NIO:[id number] DOB: [birth year] DOA: 02/03/2020 PCP: Colonel Bald, MD     Brief Narrative:   Lisa [name] is an 85 y.o. female non-English-speaking past medical history significant for paroxysmal atrial fibrillation on Xarelto, CAD, ischemic dilated cardiomyopathy with an EF of 25% status post AICD chronic respiratory failure on 2 L of oxygen chronic kidney disease stage IV insulin-dependent diabetes mellitus type 2 coronary artery disease presents via EMS for lower extremity edema.  Chest x-ray was done that showed pulmonary edema SARS-CoV-2 PCR was negative.  Assessment/Plan:   New onset of feve possibly due to left lower extremity cellulitis: Continue IV Rocephin, has remained afebrile leukocytosis improved Culture data has remained negative till date.   Lower extremity Doppler to rule out DVT is pending likely to be negative. Continue use Tylenol for fevers.  Acute respiratory failure with hypoxia due to Acute on chronic systolic heart failure with an EF of 25%: 2D echo was done on 01/27/2020 that showed an EF of 25%. BMP continues to be elevated she appears fluid overloaded on physical exam. Lasix was held on 02/04/2019 Despites normal saline bolus her creatinine continues to rise. We will consult the advanced heart failure team. Continue strict I's and O's and daily weights.  Acute kidney injury on chronic kidney disease stage IV: With a baseline creatinine 1.4-1.8.  Creatinine continues to rise despite holding Lasix will give a bolus of normal saline, continue at 75 cc an hour for 12 hours monitor strict I's and O's and daily weights.  Paroxysmal atrial fibrillation: Currently rate controlled on amiodarone and metoprolol. Continue Eliquis.  Elevated troponins: Likely demand ischemia in the setting of heart failure the cardiac biomarkers have remained flat.  Essential hypertension: Relatively  well-controlled continue current regimen.  Insulin-dependent diabetes mellitus type 2: With an A1c of 7.1 fairly controlled we will add long-acting insulin continue sliding scale.  Ethics/goals of care: I have been unsuccessful to contact the family has tried several family members including the son who picked up the phone and as soon as I identified myself he hung up and never picked up again. The patient is a non-English speaking somnolent will try again later.   DVT prophylaxis: lovenox Family Communication:unable to contact Status is: Inpatient  Remains inpatient appropriate because:Hemodynamically unstable   Dispo: The patient is from: Home              Anticipated d/c is to: SNF              Anticipated d/c date is: > 3 days              Patient currently is not medically stable to d/c.        Code Status:     Code Status Orders  (From admission, onward)         Start     Ordered   02/03/20 0431  Full code  Continuous        02/03/20 0431        Code Status History    Date Active Date Inactive Code Status Order ID Comments User Context   10/24/2015 1835 11/14/2015 1941 Full Code [id number]  Ozzie Hoyle Inpatient   09/17/2015 2132 09/25/2015 1839 Full Code [id number]  Toy Baker, MD Inpatient   08/23/2014 2100 08/28/2014 1546 Full Code [id number]  Isaiah Serge, NP Inpatient   Advance Care Planning Activity  IV Access:    Peripheral IV   Procedures and diagnostic studies:   No results found.   Medical Consultants:    None.  Anti-Infectives:   rocephin  Subjective:    Lisa [name] continues to have lower extremity pain.  Objective:    Vitals:   02/06/20 0045 02/06/20 0523 02/06/20 0525 02/06/20 0736  BP: 107/66 106/62  104/64  Pulse: 82   74  Resp: (!) 22  17 20   Temp: 98.3 F (36.8 C) 98.1 F (36.7 C)  (!) 97.5 F (36.4 C)  TempSrc: Oral Oral  Oral  SpO2: 100%  100% 100%  Weight:  79.9 kg    Height:        SpO2: 100 % O2 Flow Rate (L/min): 5 L/min FiO2 (%): 40 %   Intake/Output Summary (Last 24 hours) at 02/06/2020 1004 Last data filed at 02/06/2020 0831 Gross per 24 hour  Intake 2225.62 ml  Output 250 ml  Net 1975.62 ml   Filed Weights   02/04/20 0422 02/05/20 0322 02/06/20 0523  Weight: 72 kg 76.3 kg 79.9 kg    Exam: General exam: In no acute distress. Respiratory system: Good air movement and clear to auscultation. Cardiovascular system: S1 & S2 heard, RRR. No JVD. Gastrointestinal system: Abdomen is nondistended, soft and nontender.  Extremities: No pedal edema. Skin: No rashes, lesions or ulcers  Data Reviewed:    Labs: Basic Metabolic Panel: Recent Labs  Lab 02/03/20 0219 02/03/20 0304 02/03/20 0305 02/03/20 0535 02/04/20 0152 02/05/20 0312 02/06/20 0355  NA 137   < > 136 136 138 136 133*  K 4.8   < > 4.7 4.7 4.2 4.2 5.0  CL 96*  --  96*  --  98 95* 92*  CO2 26  --   --   --  27 29 24   GLUCOSE 155*  --  151*  --  127* 109* 185*  BUN 87*  --  98*  --  86* 96* 102*  CREATININE 3.03*  --  2.70*  --  2.85* 3.26* 3.46*  CALCIUM 8.9  --   --   --  8.6* 8.5* 8.6*  MG 2.3  --   --   --   --   --   --    < > = values in this interval not displayed.   GFR Estimated Creatinine Clearance: 12.8 mL/min (A) (by C-G formula based on SCr of 3.46 mg/dL (H)). Liver Function Tests: Recent Labs  Lab 02/03/20 0219  AST 25  ALT 17  ALKPHOS 59  BILITOT 1.1  PROT 6.9  ALBUMIN 3.9   No results for input(s): LIPASE, AMYLASE in the last 168 hours. No results for input(s): AMMONIA in the last 168 hours. Coagulation profile No results for input(s): INR, PROTIME in the last 168 hours. COVID-19 Labs  No results for input(s): DDIMER, FERRITIN, LDH, CRP in the last 72 hours.  Lab Results  Component Value Date   [place] NEGATIVE 02/03/2020    CBC: Recent Labs  Lab 02/03/20 0219 02/03/20 0304 02/03/20 0305 02/03/20 0535 02/03/20 1539 02/04/20 0152  WBC 10.3   --   --   --  15.8* 12.2*  NEUTROABS  --   --   --   --  13.4* 9.7*  HGB 11.7* 13.6 13.6 12.2 11.3* 10.8*  HCT 39.8 40.0 40.0 36.0 37.9 34.1*  MCV 93.2  --   --   --  91.1 90.9  PLT 176  --   --   --  117* 115*   Cardiac Enzymes: No results for input(s): CKTOTAL, CKMB, CKMBINDEX, TROPONINI in the last 168 hours. BNP (last 3 results) No results for input(s): PROBNP in the last 8760 hours. CBG: Recent Labs  Lab 02/05/20 0614 02/05/20 1116 02/05/20 1624 02/05/20 2201 02/06/20 0716  GLUCAP 126* 107* 184* 233* 184*   D-Dimer: No results for input(s): DDIMER in the last 72 hours. Hgb A1c: No results for input(s): HGBA1C in the last 72 hours. Lipid Profile: No results for input(s): CHOL, HDL, LDLCALC, TRIG, CHOLHDL, LDLDIRECT in the last 72 hours. Thyroid function studies: No results for input(s): TSH, T4TOTAL, T3FREE, THYROIDAB in the last 72 hours.  Invalid input(s): FREET3 Anemia work up: No results for input(s): VITAMINB12, FOLATE, FERRITIN, TIBC, IRON, RETICCTPCT in the last 72 hours. Sepsis Labs: Recent Labs  Lab 02/03/20 0219 02/03/20 0423 02/03/20 1539 02/04/20 0152  PROCALCITON  --  0.23  --  1.44  WBC 10.3  --  15.8* 12.2*   Microbiology Recent Results (from the past 240 hour(s))  Resp Panel by RT-PCR (Flu A&B, Covid) Nasopharyngeal Swab     Status: None   Collection Time: 02/03/20  2:25 AM   Specimen: Nasopharyngeal Swab; Nasopharyngeal(NP) swabs in vial transport medium  Result Value Ref Range Status   SARS Coronavirus 2 by RT PCR NEGATIVE NEGATIVE Final    Comment: (NOTE) SARS-CoV-2 target nucleic acids are NOT DETECTED.  The SARS-CoV-2 RNA is generally detectable in upper respiratory specimens during the acute phase of infection. The lowest concentration of SARS-CoV-2 viral copies this assay can detect is 138 copies/mL. A negative result does not preclude SARS-Cov-2 infection and should not be used as the sole basis for treatment or other patient  management decisions. A negative result may occur with  improper specimen collection/handling, submission of specimen other than nasopharyngeal swab, presence of viral mutation(s) within the areas targeted by this assay, and inadequate number of viral copies(<138 copies/mL). A negative result must be combined with clinical observations, patient history, and epidemiological information. The expected result is Negative.  Fact Sheet for Patients:  EntrepreneurPulse.com.au  Fact Sheet for Healthcare Providers:  IncredibleEmployment.be  This test is no t yet approved or cleared by the [place] FDA and  has been authorized for detection and/or diagnosis of SARS-CoV-2 by FDA under an Emergency Use Authorization (EUA). This EUA will remain  in effect (meaning this test can be used) for the duration of the COVID-19 declaration under Section 564(b)(1) of the Act, 21 U.S.C.section 360bbb-3(b)(1), unless the authorization is terminated  or revoked sooner.       Influenza A by PCR NEGATIVE NEGATIVE Final   Influenza B by PCR NEGATIVE NEGATIVE Final    Comment: (NOTE) The Xpert Xpress SARS-CoV-2/FLU/RSV plus assay is intended as an aid in the diagnosis of influenza from Nasopharyngeal swab specimens and should not be used as a sole basis for treatment. Nasal washings and aspirates are unacceptable for Xpert Xpress SARS-CoV-2/FLU/RSV testing.  Fact Sheet for Patients: EntrepreneurPulse.com.au  Fact Sheet for Healthcare Providers: IncredibleEmployment.be  This test is not yet approved or cleared by the [place] FDA and has been authorized for detection and/or diagnosis of SARS-CoV-2 by FDA under an Emergency Use Authorization (EUA). This EUA will remain in effect (meaning this test can be used) for the duration of the COVID-19 declaration under Section 564(b)(1) of the Act, 21 U.S.C. section 360bbb-3(b)(1),  unless the authorization is terminated or revoked.  Performed at [facility], [place] [address],  [place], [place] [address]      Medications:   . apixaban  2.5 mg Oral BID  . chlorhexidine  15 mL Mouth Rinse BID  . insulin aspart  0-15 Units Subcutaneous TID WC  . insulin aspart  0-5 Units Subcutaneous QHS  . mouth rinse  15 mL Mouth Rinse q12n4p   Continuous Infusions: . cefTRIAXone (ROCEPHIN)  IV 1 g (02/06/20 0540)      LOS: 3 days   Charlynne Cousins  [facility]  02/06/2020, 10:04 AM

## 2020-02-06 NOTE — Consult Note (Addendum)
Cardiology Consultation:   Patient ID: Lisa [name] MRN: [id number]; DOB: 08-06-1935  Admit date: 02/03/2020 Date of Consult: 02/06/2020  Primary Care Provider: Colonel Bald, MD [facility] Cardiologist: Mahala Menghini, MD Cardiology - [place]  Patient Profile:   Lisa [name] is a 85 y.o. [name] with a hx of CAD, ICM s/p ICD, chronic systolic CHF, DM, chronic respiratory failure, CKD IV, DM, HTN, persistent atrial fibrillation and OSA who is being seen today for the evaluation of CHF at the request of Charlynne Cousins, MD.   Patient with hx of CAD. Cath in 08/2015 showed occluded dLAD, not amenable to PCI. Has ischemic cardiomyopathy, long standing, with EF in 25-30%. She underwent Implantation of dual-chamber St. Jude Medical defibrillator 08/2016 with model number of the [id number] [id number] and serial number of [id number]. She has permanent atrial fibrillation with prior failed cardioversion.   Echo 01/27/2020 Left ventricular systolic function is severely reduced. LV ejection fraction = 25-30%. There is severe global hypokinesis of the left ventricle. There is focal akinesis of the distal anterior wall and apex, consistent with known prior anterior MI (2017). The right ventricle is moderately dilated. The right ventricular systolic function is mildly reduced. The left atrium is moderately dilated. The right atrium is mildly dilated. There is mild to moderate aortic stenosis. There is moderate to severe mitral regurgitation. There is moderate tricuspid regurgitation. Severe pulmonary hypertension. Estimated right ventricular systolic pressure is 30-16 mmHg. IVC size was moderately dilated. Compared to the last study dated 11/16/19, there is no change in LV, RV, or valvular function. There are findings suggestive of increaed intravascular volume (higher PA, pressure, dilated IVC, etc.).  History of Present Illness:   Lisa [name] does not speak [place].  Unable to  find Lisa interpreter at [facility].  History obtained from reviewing charts in care everywhere.  Multiple admission for CHF exacerbation and acute hypoxic respiratory failure this year.  This is her fourth admission in the past 91-months.  Previous admission was at [facility] where provider able to communicate via Education officer, community. Last admission 12/23-12/31.   Patient presented December 31 with lower extremity edema and found hypoxic with oxygen saturation of 70.  Placed on BiPAP.  Checks x-ray showing pulmonary edema.  She was admitted.  The patient was given IV Lasix 40mg   x1 and IV Lasix 80 mg x3 on 12/31 & 02/04/20 combined. She was started on IV fluids 02/05/20.    Ref. Range 02/03/2020 02:19 02/03/2020 03:05 02/04/2020 01:52 02/05/2020 03:12 02/06/2020 03:55  Creatinine Latest Ref Range: 0.44 - 1.00 mg/dL 3.03 (H) 2.70 (H) 2.85 (H) 3.26 (H) 3.46 (H)   BNP  1118 on 12/31 &   1233 on 02/05/20.  Net I  & O is positive 2.6L.  She is treated with broad-spectrum antibiotic for left lower extremity cellulitis. Doppler negative for DVT. Had mild fever. Covid negative on admit.  Per attending note, unable to reach family member.  Per prior note in care everywhere provider was unable to reach son as well.  Past Medical History:  Diagnosis Date  . Anemia   . Carotid arterial disease ([place])   . Chronic combined systolic and diastolic CHF (congestive heart failure) ([place])    a. 08/2014 Echo: EF 40-45%, no rwma, bicuspid AoV, mild AS, mod dil Asc Ao, PASP 45mmHg.  . CKD (chronic kidney disease), stage III ([place])   . Diabetes mellitus without complication ([place])   . Essential hypertension   . Mild aortic  stenosis    a. 08/2014 Echo: mild AS, bicuspid AoV.  . OSA on CPAP   . Osteoarthritis of both ankles   . Persistent atrial fibrillation    a. Dx 08/2013, CHA2DS2VASc=5-->Xarelto.  . Vitamin D deficiency     Past Surgical History:  Procedure Laterality Date  . CHOLECYSTECTOMY       Inpatient Medications: Scheduled Meds: . apixaban  2.5 mg Oral BID  . chlorhexidine  15 mL Mouth Rinse BID  . insulin aspart  0-15 Units Subcutaneous TID WC  . insulin aspart  0-5 Units Subcutaneous QHS  . insulin detemir  5 Units Subcutaneous BID  . mouth rinse  15 mL Mouth Rinse q12n4p   Continuous Infusions: . cefTRIAXone (ROCEPHIN)  IV 1 g (02/06/20 0540)   PRN Meds: acetaminophen **OR** acetaminophen  Allergies:    Allergies  Allergen Reactions  . Lisinopril Cough    Social History:   Social History   Socioeconomic History  . Marital status: Married    Spouse name: Not on file  . Number of children: Not on file  . Years of education: Not on file  . Highest education level: Not on file  Occupational History  . Not on file  Tobacco Use  . Smoking status: Former Research scientist (life sciences)  . Smokeless tobacco: Never Used  Substance and Sexual Activity  . Alcohol use: No  . Drug use: No  . Sexual activity: Not on file  Other Topics Concern  . Not on file  Social History Narrative  . Not on file   Social Determinants of Health   Financial Resource Strain: Not on file  Food Insecurity: Not on file  Transportation Needs: Not on file  Physical Activity: Not on file  Stress: Not on file  Social Connections: Not on file  Intimate Partner Violence: Not on file    Family History:   Family History  Problem Relation Age of Onset  . Diabetes Mother   . Hyperlipidemia Mother   . Hypertension Mother   . Stroke Sister   . Heart attack Neg Hx      ROS:  Please see the history of present illness. All other ROS reviewed and negative.     Physical Exam/Data:   Vitals:   02/06/20 0523 02/06/20 0525 02/06/20 0736 02/06/20 1142  BP: 106/62  104/64 (!) 100/49  Pulse:   74 81  Resp:  17 20 20   Temp: 98.1 F (36.7 C)  (!) 97.5 F (36.4 C) 98.1 F (36.7 C)  TempSrc: Oral  Oral Oral  SpO2:  100% 100% 100%  Weight: 79.9 kg     Height:        Intake/Output Summary (Last  24 hours) at 02/06/2020 1325 Last data filed at 02/06/2020 1304 Gross per 24 hour  Intake 2165.62 ml  Output 250 ml  Net 1915.62 ml   Last 3 Weights 02/06/2020 02/05/2020 02/04/2020  Weight (lbs) 176 lb 2.4 oz 168 lb 3.4 oz 158 lb 11.7 oz  Weight (kg) 79.9 kg 76.3 kg 72 kg     Body mass index is 30.24 kg/m.  General: Morbidly obese, ill appearing [name] slumped over in recliner HEENT: normal Lymph: no adenopathy Neck: Unable to assess as patient slumped over in recliner as well as body habitus Endocrine:  No thryomegaly Vascular: No carotid bruits; FA pulses 2+ bilaterally without bruits  Cardiac:  normal S1, S2; RRR; + murmur Lungs: Rales on bases Abd: soft, nontender, no hepatomegaly  Ext:1-2 + LE Edema Musculoskeletal:  No deformities, BUE and BLE strength normal and equal Skin: warm and dry  Neuro:   no focal abnormalities noted Psych: Speak non English language with close eye frequently  EKG:  The EKG was personally reviewed and demonstrates:  Atrial fibrillation, RBBB Telemetry:  Telemetry was personally reviewed and demonstrates:  V paced rhythm   Relevant CV Studies:   Echocardiogram 01/27/2020 SUMMARY Left ventricular systolic function is severely reduced. LV ejection fraction = 25-30%. There is severe global hypokinesis of the left ventricle. There is focal akinesis of the distal anterior wall and apex, consistent with known prior anterior MI (2017). The right ventricle is moderately dilated. The right ventricular systolic function is mildly reduced. The left atrium is moderately dilated. The right atrium is mildly dilated. There is mild to moderate aortic stenosis. There is moderate to severe mitral regurgitation. There is moderate tricuspid regurgitation. Severe pulmonary hypertension. Estimated right ventricular systolic pressure is 41-63 mmHg. IVC size was moderately dilated. Compared to the last study dated 11/16/19, there is no change in LV, RV, or valvular  function. There are findings suggestive of increaed intravascular volume (higher PA, pressure, dilated IVC, etc.).  - FINDINGS: LEFT VENTRICLE The left ventricle is moderately dilated. There is mild concentric left ventricular hypertrophy. No LV thrombus seen. Left ventricular systolic function is severely reduced. LV ejection fraction = 25-30%. Left ventricular filling pattern is pseudonormal. There is severe global hypokinesis of the left ventricle. There is focal akinesis of the distal anterior wall and apex, consistent with known prior anterior MI (2017).  LV WALL MOTION  - RIGHT VENTRICLE The right ventricle is moderately dilated. There is a pacemaker lead in the right ventricle. The right ventricular systolic function is mildly reduced.  LEFT ATRIUM The left atrium is moderately dilated.  RIGHT ATRIUM The right atrium is mildly dilated. There is no Doppler evidence for a patent foramen ovale. - AORTIC VALVE There is mild to moderate aortic stenosis. Aortic valve mean pressure gradient is 18 mmHg. There is trace aortic regurgitation. - MITRAL VALVE There is mild mitral annular calcification. There is moderate to severe mitral regurgitation. No significant mitral valve stenosis. - TRICUSPID VALVE Structurally normal tricuspid valve. There is moderate tricuspid regurgitation. Severe pulmonary hypertension. Estimated right ventricular systolic pressure is 84-53 mmHg. - PULMONIC VALVE Structurally normal pulmonic valve. Trace pulmonic valvular regurgitation. There is no pulmonic valvular stenosis. - ARTERIES The ascending aorta is normal size. - VENOUS IVC size was moderately dilated. - EFFUSION There is no pericardial effusion. - -  MMode/2D Measurements & Calculations IVSd: 1.2 cm   LA dim: 5.2 cm   ESV(MOD-sp4):   LVOT diam: LVIDd: 6.3 cm  EDV(MOD-sp4):   119.0 ml     2.7 cm LVPWd: 1.3 cm  158.4 ml LVIDs: 5.5 cm     _______________________________________________________________________ LVAd ap4:    SV(MOD-sp4):    LA area A2:    LA area A4: 44.4 cm2     39.4 ml      25.9 cm2     21.7 cm2 EDV(sp4-el):   SI(MOD-sp4): 160.9 ml     22.4 ml/m2 LVAs ap4:  38.1 cm2 ESV(sp4-el): 127.6 ml    _______________________________________________________________________ LA vol: 83.3 ml LA vol index:          47.4 ml/m2  Doppler Measurements & Calculations MV E max vel:    MV V2 max:    MV dec time:  SV(LVOT): 115.0 cm/sec    155.0 cm/sec   0.19 sec  96.2 ml MV A max vel:    MV max PG:            Ao V2 max: 65.0 cm/sec     9.6 mmHg             265.0 cm/sec MV E/A: 1.8     MV V2 mean:            Ao max PG: Med Peak E' Vel:  83.4 cm/sec            28.1 mmHg 5.1 cm/sec     MV mean PG:            Ao V2 mean: Lat Peak E' Vel:  3.0 mmHg             201.0 cm/sec 9.6 cm/sec     MV V2 VTI:            Ao mean PG: E/Lat E`: 12.0   42.2 cm              18.0 mmHg E/Med E`: 22.5   MVA(VTI): 2.3 cm2         Ao V2 VTI:                           61.8 cm                           AVA (VTI):                           1.6 cm2     _______________________________________________________________________ LV V1 VTI: 16.8 cm PA max PG:    TR max vel:   RAP systole:          3.9 mmHg     358.0 cm/sec  10.0 mmHg                   TR max PG:                   51.3 mmHg                   RVSP(TR):                   61.3 mmHg     _______________________________________________________________________ AS Dimensionless  AVAi(VTI)     SV  index(LVOT): Index (VTI): 0.27  cm^2/m^2:                   54.7 ml/m2          0.89 cm2  _______________________________________________________________________ Reading Physician:         MD Georgeanna Harrison. Quillian Quince [id number] 01/27/2020 06:31 PM   Cath 08/2015 Diagnostic Procedure Summary  LMCA: .Mild luminal irregularities < 20%  LAD: .Proximal LAD 40% stenosis  Mild luminal irregularities < 30%  The distal LAD is subtotaled with TIMI 1 flow ( it is a small vessel < 1.5 mm)  LCx: .Mild luminal irregularities < 20%  RCA:.Mild luminal irregularities < 30%  Ramus: .Mild luminal irregularities < 20%  Diagnostic Procedure Recommendations  Medical therapy  Signatures  Electronically signed by Milana Huntsman, MD(Diagnostic  Physician) on 08/14/2015 22:21  Angiographic findings  Cardiac Arteries and Lesion Findings  LMCA: Mild luminal irregularities < 20%  LAD: Proximal LAD 40% stenosis  Mild luminal irregularities < 30%  The far distal LAD is subtotaled  with TIMI 1 flow ( it is a small vessel < 1.5  mm)  LCx: Mildluminal irregularities < 20%  RCA: Mild luminal irregularities < 30%  Ramus: Mild luminal irregularities < 20%  Procedure Data  Procedure Date  Date: 08/14/2015 Start: 21:45  Entry Locations  - Retrograde Percutaneous access was performed through the Right Radial   artery. A 6 Fr sheath was inserted.  Diagnostic Catheters  - 5 Fr5F 3DRC Diagnostic Catheter was used for Right coronary angiography.  - 5 Fr5F MPA2 IMPULSE Diagnostic Catheter was used for Left   ventriculography.  Contrast Material  - Omnipaque92 ml  Fluoroscopy Time: Diagnostic: 11:05 minutes. Total: 11:05 minutes.  Admission Data  Admission Status: Emergency department.  VA  LV function assessed CH:ENIDPO.  Ejection Fraction  - Method: LV gram. EF%: 55.  Hemodynamics  Condition: Rest  Heart Rate: 90 bpm  Pressures  +-----+------------+   !Site !Pressure  !  +-----+------------+  !AO  !163/81 (114)!  +-----+------------+  !AO  !157/77 (105)!  +-----+------------+  !LV  !164/31 ,28 !  +-----+------------+  !LV  !177/26 ,26 !  +-----+------------+    Laboratory Data:  High Sensitivity Troponin:   Recent Labs  Lab 02/03/20 0219 02/03/20 0508  TROPONINIHS 40* 57*     Chemistry Recent Labs  Lab 02/04/20 0152 02/05/20 0312 02/06/20 0355  NA 138 136 133*  K 4.2 4.2 5.0  CL 98 95* 92*  CO2 27 29 24   GLUCOSE 127* 109* 185*  BUN 86* 96* 102*  CREATININE 2.85* 3.26* 3.46*  CALCIUM 8.6* 8.5* 8.6*  GFRNONAA 16* 14* 13*  ANIONGAP 13 12 17*    Recent Labs  Lab 02/03/20 0219  PROT 6.9  ALBUMIN 3.9  AST 25  ALT 17  ALKPHOS 59  BILITOT 1.1   Hematology Recent Labs  Lab 02/03/20 0219 02/03/20 0304 02/03/20 0535 02/03/20 1539 02/04/20 0152  WBC 10.3  --   --  15.8* 12.2*  RBC 4.27  --   --  4.16 3.75*  HGB 11.7*   < > 12.2 11.3* 10.8*  HCT 39.8   < > 36.0 37.9 34.1*  MCV 93.2  --   --  91.1 90.9  MCH 27.4  --   --  27.2 28.8  MCHC 29.4*  --   --  29.8* 31.7  RDW 17.2*  --   --  17.2* 17.3*  PLT 176  --   --  117* 115*   < > = values in this interval not displayed.   BNP Recent Labs  Lab 02/03/20 0221 02/05/20 1106  BNP 1,118.4* 1,233.0*    DDimer No results for input(s): DDIMER in the last 168 hours.   Radiology/Studies:  DG Chest Portable 1 View  Result Date: 02/03/2020 CLINICAL DATA:  Leg swelling EXAM: PORTABLE CHEST 1 VIEW COMPARISON:  January 28, 2020 FINDINGS: The heart size and mediastinal contours are unchanged with cardiomegaly. A left-sided pacemaker again noted. There is diffusely increased interstitial markings throughout both lungs. No definite pleural effusion. No acute osseous abnormality. IMPRESSION: Findings which may be suggestive of interstitial edema. Electronically Signed   By: Prudencio Pair M.D.   On: 02/03/2020 02:49   VAS [place] LOWER EXTREMITY VENOUS  (DVT)  Result Date: 02/04/2020  Lower Venous DVT Study Indications: Swelling, and Pain.  Limitations: Patient started kicking when calf was touched assumed to be the rash and pain as a cause. Patient does not speak english. Comparison Study: No previous exam Performing Technologist: Vonzell Schlatter  RVT  Examination Guidelines: A complete evaluation includes B-mode imaging, spectral Doppler, color Doppler, and power Doppler as needed of all accessible portions of each vessel. Bilateral testing is considered an integral part of a complete examination. Limited examinations for reoccurring indications may be performed as noted. The reflux portion of the exam is performed with the patient in reverse Trendelenburg.  +---------+---------------+---------+-----------+----------+--------------+ LEFT     CompressibilityPhasicitySpontaneityPropertiesThrombus Aging +---------+---------------+---------+-----------+----------+--------------+ CFV      Full                                                        +---------+---------------+---------+-----------+----------+--------------+ SFJ      Full                                                        +---------+---------------+---------+-----------+----------+--------------+ FV Prox  Full                                                        +---------+---------------+---------+-----------+----------+--------------+ FV Mid   Full                                                        +---------+---------------+---------+-----------+----------+--------------+ FV DistalFull                                                        +---------+---------------+---------+-----------+----------+--------------+ PFV      Full                                                        +---------+---------------+---------+-----------+----------+--------------+ POP      Full                                                         +---------+---------------+---------+-----------+----------+--------------+   Left Technical Findings: Not visualized segments include PTV, PerV.   Summary: LEFT: - There is no evidence of deep vein thrombosis in the lower extremity. However, portions of this examination were limited- see technologist comments above.  - No cystic structure found in the popliteal fossa.  *See table(s) above for measurements and observations. Electronically signed by Ruta Hinds MD on 02/04/2020 at 9:59:12 AM.    Final      Assessment and Plan:   1. Acute on chronic systolic heart failure -Longstanding history of low EF in 25-30 range.  Multiple CHF exacerbations this year. This is her fourth admission in the past 2 months. Non-English-speaking [name]. Questionable compliance of medication at home given language barrier. -Chronic elevation in BNP morbidly obese [name] -Chest x-ray with interstitial edema on admission -Patient initially given diuretics for first 2 days with improvement in renal function then worsen to 3.26 on 1/2. She then started on IV fluids however Scr continues to worsen at 3.46.  -Patient is clearly volume overloaded on limited exam - Will give IV lasix 80mg  x BID. Stop fluids. Strict I & O. Advance heart failure to see tomorrow and likely needs inotrops. Her BP is soft. Likely exacerbation due to valvular heart disease.   2. Ischemic cardiomyopathy s/p Saint Jude dual-chamber ICD -Not on advanced heart failure regimen due to soft blood pressure -Most recent echocardiogram on 12/24 at outside hospital showed LV function of 25 to 30%, focal akinesis of distal anterior wall and apex, dilated RV with mildly reduced function, severe pulmonary hypertension. -No ACE/ARB or Entresto given renal function -She may benefit from right heart cath however given complex situation and language barrier need to weigh risk   3. Permanent atrial fibrillation -Anticoagulated with Eliquis (age and dose  appropriate)  4. CAD -Cardiac cath in 2017 showed occluded distal LAD >>not amiable for PCI>> treated medically -Reported chest pain  5. Valvular heart disease -Most recent echocardiogram with mild to moderate AS, moderate to severe MR and moderate TR - Pt does not seems surgical candidate  6. Acute on chronic hypoxic respiratory failure 7. OSA -Suspect obesity hypoventilation syndrome -On BiPAP here  8. Acute on chronic kidney disease stage IV -Previously renal function was in 1.5 range in 2017 -Most recent lab work shows creatinine around 3 - Consider nephrology eval if Scr continue to worsen   ? Palliative care. Attending unable to reach the son. Husband come to visit patient however he does not speak english as well. Consider SW consult prior to discharge.    Addendum: Able to find Lisa translator number from [employer]. Alinda Sierras, Network engineer, at [facility] knows contact number.  However due to patient's somnolent status unable to communicate with patient despite using translator.  Patient was mumbling some words intermittently.   New York Heart Association (NYHA) Functional Class NYHA Class III   CHA2DS2-VASc Score = 7  This indicates a 11.2% annual risk of stroke. The patient's score is based upon: CHF History: Yes HTN History: Yes Diabetes History: Yes Stroke History: No Vascular Disease History: Yes Age Score: 2 Gender Score: 1    For questions or updates, please contact [place] Please consult [email or web address] for contact info under    Signed, Leanor Kail, PA  02/06/2020 1:25 PM   Agree with note by Robbie Lis PA-C  Lisa [name] is an 85 year old moderately obese Lisa [name] admitted with congestive heart failure.  She has had multiple heart failure admissions in the last several years.  She has ischemic heart myopathy, status post ICD implantation, chronic A. fib on oral anticoagulation, severe LV dysfunction with moderate MR, mild to moderate left  ear, and moderately severe renal insufficiency.  She was discharged from [facility] hospital day before admission where she showed up.  Heart failure.  She has been diuresed although her I's/O's are +2.6 L.  Her renal function has deteriorated as well with diuresis.  On exam she is clearly in heart failure.  She has 2+ pitting edema left greater than right, increased JVD, crackles halfway up bilaterally.  I do not think we will yet able to adequately diurese her with IV diuretics without inotropic support.  We will ask the heart failure service to assist.  The bigger picture though should be goals of care.  I think palliative care consult would be appropriate.  Lorretta Harp, M.D., [place], [facility], [place] [address] [place], [place]  [id number]  [phone] 02/06/2020 1:53 PM

## 2020-02-06 NOTE — TOC Initial Note (Signed)
Transition of Care [facility]) - Initial/Assessment Note    Patient Details  Name: Lisa [name] MRN: [id number] Date of Birth: 09/14/1935  Transition of Care [facility]) CM/SW Contact:    Bethann Berkshire, [place] Phone Number: 02/06/2020, 2:36 PM  Clinical Narrative:                  CSW met with pt. Attempted to use interpreter line [phone]. CSW is informed that pt is unable to hear over the phone so family is needed to come in and interpret.   CSW called phone number listed for pt's son in chart. Number is wrong. CSW called pt's granddaughter and is provided correct number for pt son at [phone]  CSW is able to speak with son at new number. Updated in chart. Son states that pt lives at home with her husband. He understands recommendation and is agreeable and states she would be agreeable as well. Son explains she has been in the past but didn't know where. In the past family has not been satisfied with bed offers. Son is agreeable with initial workup. He states he will be visiting his mom at hospital today.   Fl2 completed and bed requests sent in hub.  Pt has no SSN listed and therefore has pending PASSR #  Expected Discharge Plan: [place] Barriers to Discharge: Continued Medical Work up   Patient Goals and CMS Choice        Expected Discharge Plan and Services Expected Discharge Plan: [place]       Living arrangements for the past 2 months: Single Family Home                                      Prior Living Arrangements/Services Living arrangements for the past 2 months: Single Family Home Lives with:: Spouse          Need for Family Participation in Patient Care: Yes (Comment) Care giver support system in place?: Yes (comment)      Activities of Daily Living      Permission Sought/Granted                  Emotional Assessment Appearance:: Appears stated age Attitude/Demeanor/Rapport: Lethargic     Alcohol /  Substance Use: Not Applicable Psych Involvement: No (comment)  Admission diagnosis:  CHF exacerbation ([place]) [I50.9] Acute on chronic diastolic congestive heart failure ([place]) [I50.33] Acute hypoxemic respiratory failure ([place]) [J96.01] Patient Active Problem List   Diagnosis Date Noted  . Pressure injury of skin 02/04/2020  . CHF exacerbation ([place]) 02/03/2020  . Pericardial effusion with cardiac tamponade post draaijnage 10/17/15 11/07/2015  . Chest tube in place left 11/07/2015  . Acute on chronic respiratory failure with hypoxia ([place])   . Pleural effusion   . AKI (acute kidney injury) ([place]) 09/18/2015  . Sinus pause 09/18/2015  . Liver lesion 09/18/2015  . Leukocytosis 09/17/2015  . Elevated troponin 09/17/2015  . Chest pain 09/17/2015  . Uncontrolled type 2 diabetes mellitus with circulatory disorder, with long-term current use of insulin ([place]) 08/02/2015  . Essential hypertension   . Acute on chronic combined systolic and diastolic heart failure ([place])   . Aortic stenosis due to bicuspid aortic valve   . OSA on CPAP   . Persistent atrial fibrillation ([place])   . CKD (chronic kidney disease), stage III ([place])   . Atrial fibrillation with RVR ([place]) 08/23/2014  PCP:  Colonel Bald, MD Pharmacy:   [facility] [place], [place] [address] Phone: [phone] Fax: [phone]  [facility] [place], [place] [address] [place] [address] [place] [address] Phone: [phone] Fax: [phone]     Social Determinants of Health (SDOH) Interventions    Readmission Risk Interventions No flowsheet data found.

## 2020-02-07 DIAGNOSIS — I5043 Acute on chronic combined systolic (congestive) and diastolic (congestive) heart failure: Secondary | ICD-10-CM

## 2020-02-07 DIAGNOSIS — Z515 Encounter for palliative care: Secondary | ICD-10-CM

## 2020-02-07 DIAGNOSIS — N179 Acute kidney failure, unspecified: Secondary | ICD-10-CM | POA: Diagnosis not present

## 2020-02-07 DIAGNOSIS — J9601 Acute respiratory failure with hypoxia: Secondary | ICD-10-CM | POA: Diagnosis not present

## 2020-02-07 DIAGNOSIS — J9621 Acute and chronic respiratory failure with hypoxia: Secondary | ICD-10-CM | POA: Diagnosis not present

## 2020-02-07 LAB — GLUCOSE, CAPILLARY
Glucose-Capillary: 139 mg/dL — ABNORMAL HIGH (ref 70–99)
Glucose-Capillary: 117 mg/dL — ABNORMAL HIGH (ref 70–99)
Glucose-Capillary: 154 mg/dL — ABNORMAL HIGH (ref 70–99)
Glucose-Capillary: 240 mg/dL — ABNORMAL HIGH (ref 70–99)

## 2020-02-07 LAB — BASIC METABOLIC PANEL
Anion gap: 13 (ref 5–15)
BUN: 115 mg/dL — ABNORMAL HIGH (ref 8–23)
CO2: 24 mmol/L (ref 22–32)
Calcium: 8.5 mg/dL — ABNORMAL LOW (ref 8.9–10.3)
Chloride: 97 mmol/L — ABNORMAL LOW (ref 98–111)
Creatinine, Ser: 3.5 mg/dL — ABNORMAL HIGH (ref 0.44–1.00)
GFR, Estimated: 13 mL/min — ABNORMAL LOW (ref >60)
Glucose, Bld: 154 mg/dL — ABNORMAL HIGH (ref 70–99)
Potassium: 5 mmol/L (ref 3.5–5.1)
Sodium: 134 mmol/L — ABNORMAL LOW (ref 135–145)

## 2020-02-07 MED ORDER — SODIUM POLYSTYRENE SULFONATE 15 GM/60ML PO SUSP
15.0000 g | Freq: Once | ORAL | Status: AC
  Administered 2020-02-07: 15 g via ORAL
  Filled 2020-02-07: qty 60

## 2020-02-07 MED ORDER — ADULT MULTIVITAMIN W/MINERALS CH
1.0000 | ORAL_TABLET | Freq: Every day | ORAL | Status: DC
  Administered 2020-02-07 – 2020-02-14 (×8): 1 via ORAL
  Filled 2020-02-07 (×8): qty 1

## 2020-02-07 MED ORDER — PROSOURCE PLUS PO LIQD
30.0000 mL | Freq: Two times a day (BID) | ORAL | Status: DC
  Administered 2020-02-07 – 2020-02-14 (×12): 30 mL via ORAL
  Filled 2020-02-07 (×12): qty 30

## 2020-02-07 NOTE — Progress Notes (Signed)
Initial Nutrition Assessment  DOCUMENTATION CODES:   Obesity unspecified  INTERVENTION:   -MVI with minerals daily -30 ml Prosource Plus BID, each supplement provides 100 kcals and 14 grams protein -Magic cup TID with meals, each supplement provides 290 kcal and 9 grams of protein  NUTRITION DIAGNOSIS:   Inadequate oral intake related to lethargy/confusion,decreased appetite as evidenced by meal completion < 50%.  GOAL:   Patient will meet greater than or equal to 90% of their needs  MONITOR:   PO intake,Supplement acceptance,Labs,Weight trends,Skin,I & O's  REASON FOR ASSESSMENT:   Low [place]    ASSESSMENT:   Lisa [name] is a 85 y.o. female with medical history significant of paroxysmal A. fib on Xarelto, CAD, ischemic dilated cardiomyopathy status post AICD, chronic hypoxemic respiratory failure on 2 L home oxygen, history of cardiac arrest, pulmonary hypertension, CKD stage IV, insulin-dependent type 2 diabetes, hypertension, OSA on CPAP, carotid arterial disease presenting to the ED via EMS for evaluation of bilateral lower extremity edema.  No family available at this time.  [place] interpreter services used but patient is currently somnolent and not able to give any history.  She was recently admitted to [facility] for CHF exacerbation and echo revealed LVEF of 25 to 30%.  Pt admitted with CHF.  Reviewed I/O's: +140 ml x 24 hours and +2.6 L since admission  UOP: 350 ml x 24 hours  Per MD notes, pt with new onset fever, possible due to lower extremity cellulitis. Pt is a poor candidate for advanced heart failure therapies and HD; Advanced Heart Failure team recommending palliative care/ hospice (consult pending).   Pt very lethargic at time of visit. She did not respond to voice or touch.   Noted pt with poor oral intake; meal completion 0-35%. Noted two [place] smoothie drinks brought in by family on tray table, which were untouched.   Reviewed  wt hx; wt has been stable over the past year.   Lab Results  Component Value Date   HGBA1C 6.5 (H) 02/03/2020   PTA DM medications are 12 units insulin aspart TID with meals.   Labs reviewed: Na: 134, CBGS: 139-165 (inpatient orders for glycemic control are 0-15 units insulin aspart TID with meals, 0-5 units insulin aspart daily at bedtime, and 5 units insulin detemir BID).   NUTRITION - FOCUSED PHYSICAL EXAM:  Flowsheet Row Most Recent Value  Orbital Region No depletion  Upper Arm Region No depletion  Thoracic and Lumbar Region No depletion  Buccal Region No depletion  Temple Region No depletion  Clavicle Bone Region No depletion  Clavicle and Acromion Bone Region No depletion  Scapular Bone Region No depletion  Dorsal Hand No depletion  Patellar Region No depletion  Anterior Thigh Region No depletion  Posterior Calf Region No depletion  Edema (RD Assessment) None  Hair Reviewed  Eyes Reviewed  Mouth Reviewed  Skin Reviewed  Nails Reviewed       Diet Order:   Diet Order            Diet heart healthy/carb modified Room service appropriate? Yes; Fluid consistency: Thin  Diet effective now                 EDUCATION NEEDS:   No education needs have been identified at this time  Skin:  Skin Assessment: Skin Integrity Issues: Skin Integrity Issues:: Stage I Stage I: sacrum  Last BM:  02/05/20  Height:   Ht Readings from Last 1 Encounters:  02/04/20  5\' 4"  (1.626 m)    Weight:   Wt Readings from Last 1 Encounters:  02/06/20 79.9 kg    Ideal Body Weight:  54.5 kg  BMI:  Body mass index is 30.24 kg/m.  Estimated Nutritional Needs:   Kcal:  1610-9604  Protein:  85-100 grams  Fluid:  > 1.7 L    Loistine Chance, RD, LDN, [place] Registered Dietitian II Certified Diabetes Care and Education Specialist Please refer to [facility] for RD and/or RD on-call/weekend/after hours pager

## 2020-02-07 NOTE — Progress Notes (Addendum)
Consulted respiratory therapy due to ongoing machine errors and unsuccessful troubleshooting. Respiratory advised to put patient back on 5L [place] from cpap and reevaluate in AM.

## 2020-02-07 NOTE — Progress Notes (Signed)
Patient turned up to L due to oxygen saturations in the high 80s on 2L. RT attempted to place patient on BIPAP and patient pulled it off.

## 2020-02-07 NOTE — Consult Note (Addendum)
Consultation Note Date: 02/08/2020   Patient Name: Lisa [name]  DOB: 1935-04-13  MRN: [id number]  Age / Sex: 85 y.o., female  PCP: Colonel Bald, MD Referring Physician: Charlynne Cousins, MD  Reason for Consultation: Establishing goals of care  HPI/Patient Profile: 85 y.o. [place] speaking female  with past medical history of CAD, valvular disease (AS, MR, TR), pulmonary HTN, systolic CHF (EF 97-94%) ICD in place, CKD 3 (baseline cr. 1.6 - 1.9), atrial fibrillation on Eliquis, OSA on CPAP, and DM,  who was admitted on 02/03/2020 with LLE cellulitis.  Of note this is her 4th hospitalization in 2 months.  She has suffered with acute on chronic heart failure exacerbations.  Gradually her kidney function has failed as well.  Currently her BUN is 115 and creatinine is 3.5.   She has been evaluated by Cardiology and the Advanced Heart Failure team.  Unfortunately she is not a candidate for advanced HF therapies.   She is also a poor hemodialysis candidate.  Notes indicate that there has been some difficulty communicating with the patient and her family.  Of note, during her admissions at [facility] her code status was DNR.  Clinical Assessment and Goals of Care:  I have reviewed medical records including EPIC notes, labs and imaging, received report from the care team, examined the patient and attempted to telephone multiple family members including her son Diho and her grand daughter Hughes Better  to discuss diagnosis prognosis, [place], EOL wishes, disposition and options.  On exam the patient is sleeping, mouth open tongue out.  She opens her eyes briefly to my voice and touch. She acknowledges me and then closes her eyes.  She does not respond to me further. The patient has multiple skin lesions on her arm and legs with 2-3+ swelling in her extremities.  In talking with the RN I learn that the  patient's PO intake is very poor and she is unable to mobilize.  Primary Decision Maker:  NEXT OF KIN     SUMMARY OF RECOMMENDATIONS   PMT will continue to reach out to patient and family.  Thus far I have been unsuccessful in generating conversation with the patient or her family.   If important decisions must be made, we may need to ask TOC to move forward with a wellness check at the family home. Plan to discuss Hospice support services with patient and family.  Code Status/Advance Care Planning:  She is listed as a full code on admission.  I do not know if a code status conversation took place.  At [facility] patient was a DNR.   Symptom Management:   Per primary  Additional Recommendations (Limitations, Scope, Preferences):  Full Scope Treatment   Psycho-social/Spiritual:   Desire for further Chaplaincy support: Not discussed.  Prognosis:  weeks -  given patient's systolic heart failure with limited options for intervention, valvular disease, progressive kidney failure (now GFR of 14) - poor candidate for HD, PO intake is poor and she is unable  to mobilize.  Given these factors and her frequent hospitalizations she is a good candidate for Hospice services.    Discharge Planning: To Be Determined      Primary Diagnoses: Present on Admission: . Elevated troponin . Essential hypertension . Acute on chronic combined systolic and diastolic heart failure ([place]) . CKD (chronic kidney disease), stage III ([place])   I have reviewed the medical record, interviewed the patient and family, and examined the patient. The following aspects are pertinent.  Past Medical History:  Diagnosis Date  . Anemia   . Carotid arterial disease ([place])   . Chronic combined systolic and diastolic CHF (congestive heart failure) ([place])    a. 08/2014 Echo: EF 40-45%, no rwma, bicuspid AoV, mild AS, mod dil Asc Ao, PASP 52mmHg.  . CKD (chronic kidney disease), stage III ([place])   . Diabetes mellitus  without complication ([place])   . Essential hypertension   . Mild aortic stenosis    a. 08/2014 Echo: mild AS, bicuspid AoV.  . OSA on CPAP   . Osteoarthritis of both ankles   . Persistent atrial fibrillation    a. Dx 08/2013, CHA2DS2VASc=5-->Xarelto.  . Vitamin D deficiency    Social History   Socioeconomic History  . Marital status: Married    Spouse name: Not on file  . Number of children: Not on file  . Years of education: Not on file  . Highest education level: Not on file  Occupational History  . Not on file  Tobacco Use  . Smoking status: Former Research scientist (life sciences)  . Smokeless tobacco: Never Used  Substance and Sexual Activity  . Alcohol use: No  . Drug use: No  . Sexual activity: Not on file  Other Topics Concern  . Not on file  Social History Narrative  . Not on file   Social Determinants of Health   Financial Resource Strain: Not on file  Food Insecurity: Not on file  Transportation Needs: Not on file  Physical Activity: Not on file  Stress: Not on file  Social Connections: Not on file   Family History  Problem Relation Age of Onset  . Diabetes Mother   . Hyperlipidemia Mother   . Hypertension Mother   . Stroke Sister   . Heart attack Neg Hx     Allergies  Allergen Reactions  . Lisinopril Cough     Vital Signs: BP (!) 122/53 (BP Location: Left Arm)   Pulse 63   Temp 98.2 F (36.8 C) (Oral)   Resp 19   Ht 5\' 4"  (1.626 m)   Wt 79.9 kg   SpO2 100%   BMI 30.24 kg/m  Pain Scale: 0-10   Pain Score: 0-No pain   SpO2: SpO2: 100 % O2 Device:SpO2: 100 % O2 Flow Rate: .O2 Flow Rate (L/min): 5 L/min    Palliative Assessment/Data: 20%     Time In: 11:00 Time Out: 12:10 Time Total: 70 min. Visit consisted of counseling and education dealing with the complex and emotionally intense issues surrounding the need for palliative care and symptom management in the setting of serious and potentially life-threatening illness. Greater than 50%  of this time was  spent counseling and coordinating care related to the above assessment and plan.  Signed by: Florentina Jenny, PA-C Palliative Medicine  Please contact Palliative Medicine Team phone at [phone] for questions and concerns.  For individual provider: See Shea Evans

## 2020-02-07 NOTE — Progress Notes (Signed)
[facility] PROGRESS NOTE    Progress Note  Lisa [name]  JEH:[id number] DOB: [birth year] DOA: 02/03/2020 PCP: Colonel Bald, MD     Brief Narrative:   Lisa [name] is an 85 y.o. female non-English-speaking past medical history significant for paroxysmal atrial fibrillation on Xarelto, CAD, ischemic dilated cardiomyopathy with an EF of 25% status post AICD chronic respiratory failure on 2 L of oxygen chronic kidney disease stage IV insulin-dependent diabetes mellitus type 2 coronary artery disease presents via EMS for lower extremity edema.  Chest x-ray was done that showed pulmonary edema SARS-CoV-2 PCR was negative.  Assessment/Plan:   New onset of feve possibly due to left lower extremity cellulitis: Continue IV Rocephin, has remained afebrile leukocytosis improved Culture data has remained negative till date.   Lower extremity Doppler to rule out DVT is pending likely to be negative. Continue use Tylenol for fevers.  Acute respiratory failure with hypoxia due to Acute on chronic systolic heart failure with an EF of 25%: 2D echo was done on 01/27/2020 that showed an EF of 25%. BNP continues to be elevated she appears fluid overloaded on physical exam. The advanced heart failure team was consulted awaiting further recommendations.  Acute kidney injury on chronic kidney disease stage IV: With a baseline creatinine 1.4-1.8.  Her creatinine continues to rise despite treatment, her potassium is rising we will give her oral Kayexalate.  Paroxysmal atrial fibrillation: Currently rate controlled on amiodarone and metoprolol. Continue Eliquis.  Elevated troponins: Likely demand ischemia in the setting of heart failure the cardiac biomarkers have remained flat.  Essential hypertension: Relatively well-controlled continue current regimen.  Insulin-dependent diabetes mellitus type 2: With an A1c of 7.1 fairly controlled we will add long-acting insulin continue  sliding scale.  Ethics/goals of care: I have been unsuccessful to contact the family has tried several family members and have been unsuccessful We will get palliative care involved.   DVT prophylaxis: lovenox Family Communication:unable to contact Status is: Inpatient  Remains inpatient appropriate because:Hemodynamically unstable   Dispo: The patient is from: Home              Anticipated d/c is to: SNF              Anticipated d/c date is: > 3 days              Patient currently is not medically stable to d/c.        Code Status:     Code Status Orders  (From admission, onward)         Start     Ordered   02/03/20 0431  Full code  Continuous        02/03/20 0431        Code Status History    Date Active Date Inactive Code Status Order ID Comments User Context   10/24/2015 1835 11/14/2015 1941 Full Code [id number]  Ozzie Hoyle Inpatient   09/17/2015 2132 09/25/2015 1839 Full Code [id number]  Toy Baker, MD Inpatient   08/23/2014 2100 08/28/2014 1546 Full Code [id number]  Isaiah Serge, NP Inpatient   Advance Care Planning Activity        IV Access:    Peripheral IV   Procedures and diagnostic studies:   No results found.   Medical Consultants:    None.  Anti-Infectives:   rocephin  Subjective:    Lisa [name] sleepy this morning not very talkative.  Objective:    Vitals:   02/07/20 0000 02/07/20 0323 02/07/20 0400  02/07/20 0718  BP: (!) 103/59  97/61 140/69  Pulse: 60 80 78 62  Resp: 16 18 17 20   Temp: 98.1 F (36.7 C)  97.6 F (36.4 C) 98.1 F (36.7 C)  TempSrc: Axillary   Oral  SpO2: 100% 99% 99% 100%  Weight:      Height:       SpO2: 100 % O2 Flow Rate (L/min): 5 L/min FiO2 (%): 40 %   Intake/Output Summary (Last 24 hours) at 02/07/2020 0944 Last data filed at 02/07/2020 0902 Gross per 24 hour  Intake 370 ml  Output 350 ml  Net 20 ml   Filed Weights   02/04/20 0422 02/05/20 0322 02/06/20 0523  Weight:  72 kg 76.3 kg 79.9 kg    Exam: General exam: In no acute distress. Respiratory system: Good air movement and clear to auscultation. Cardiovascular system: S1 & S2 heard, RRR. No JVD. Gastrointestinal system: Abdomen is nondistended, soft and nontender.  Extremities: No pedal edema. Skin: No rashes, lesions or ulcers  Data Reviewed:    Labs: Basic Metabolic Panel: Recent Labs  Lab 02/03/20 0219 02/03/20 0304 02/03/20 0305 02/03/20 0535 02/04/20 0152 02/05/20 0312 02/06/20 0355 02/07/20 0430  NA 137   < > 136 136 138 136 133* 134*  K 4.8   < > 4.7 4.7 4.2 4.2 5.0 5.0  CL 96*  --  96*  --  98 95* 92* 97*  CO2 26  --   --   --  27 29 24 24   GLUCOSE 155*  --  151*  --  127* 109* 185* 154*  BUN 87*  --  98*  --  86* 96* 102* 115*  CREATININE 3.03*  --  2.70*  --  2.85* 3.26* 3.46* 3.50*  CALCIUM 8.9  --   --   --  8.6* 8.5* 8.6* 8.5*  MG 2.3  --   --   --   --   --   --   --    < > = values in this interval not displayed.   GFR Estimated Creatinine Clearance: 12.7 mL/min (A) (by C-G formula based on SCr of 3.5 mg/dL (H)). Liver Function Tests: Recent Labs  Lab 02/03/20 0219  AST 25  ALT 17  ALKPHOS 59  BILITOT 1.1  PROT 6.9  ALBUMIN 3.9   No results for input(s): LIPASE, AMYLASE in the last 168 hours. No results for input(s): AMMONIA in the last 168 hours. Coagulation profile No results for input(s): INR, PROTIME in the last 168 hours. COVID-19 Labs  No results for input(s): DDIMER, FERRITIN, LDH, CRP in the last 72 hours.  Lab Results  Component Value Date   [place] NEGATIVE 02/03/2020    CBC: Recent Labs  Lab 02/03/20 0219 02/03/20 0304 02/03/20 0305 02/03/20 0535 02/03/20 1539 02/04/20 0152  WBC 10.3  --   --   --  15.8* 12.2*  NEUTROABS  --   --   --   --  13.4* 9.7*  HGB 11.7* 13.6 13.6 12.2 11.3* 10.8*  HCT 39.8 40.0 40.0 36.0 37.9 34.1*  MCV 93.2  --   --   --  91.1 90.9  PLT 176  --   --   --  117* 115*   Cardiac Enzymes: No  results for input(s): CKTOTAL, CKMB, CKMBINDEX, TROPONINI in the last 168 hours. BNP (last 3 results) No results for input(s): PROBNP in the last 8760 hours. CBG: Recent Labs  Lab 02/06/20 0716 02/06/20 1105 02/06/20  1611 02/06/20 2137 02/07/20 0615  GLUCAP 184* 194* 163* 165* 139*   D-Dimer: No results for input(s): DDIMER in the last 72 hours. Hgb A1c: No results for input(s): HGBA1C in the last 72 hours. Lipid Profile: No results for input(s): CHOL, HDL, LDLCALC, TRIG, CHOLHDL, LDLDIRECT in the last 72 hours. Thyroid function studies: No results for input(s): TSH, T4TOTAL, T3FREE, THYROIDAB in the last 72 hours.  Invalid input(s): FREET3 Anemia work up: No results for input(s): VITAMINB12, FOLATE, FERRITIN, TIBC, IRON, RETICCTPCT in the last 72 hours. Sepsis Labs: Recent Labs  Lab 02/03/20 0219 02/03/20 0423 02/03/20 1539 02/04/20 0152  PROCALCITON  --  0.23  --  1.44  WBC 10.3  --  15.8* 12.2*   Microbiology Recent Results (from the past 240 hour(s))  Resp Panel by RT-PCR (Flu A&B, Covid) Nasopharyngeal Swab     Status: None   Collection Time: 02/03/20  2:25 AM   Specimen: Nasopharyngeal Swab; Nasopharyngeal(NP) swabs in vial transport medium  Result Value Ref Range Status   SARS Coronavirus 2 by RT PCR NEGATIVE NEGATIVE Final    Comment: (NOTE) SARS-CoV-2 target nucleic acids are NOT DETECTED.  The SARS-CoV-2 RNA is generally detectable in upper respiratory specimens during the acute phase of infection. The lowest concentration of SARS-CoV-2 viral copies this assay can detect is 138 copies/mL. A negative result does not preclude SARS-Cov-2 infection and should not be used as the sole basis for treatment or other patient management decisions. A negative result may occur with  improper specimen collection/handling, submission of specimen other than nasopharyngeal swab, presence of viral mutation(s) within the areas targeted by this assay, and inadequate number  of viral copies(<138 copies/mL). A negative result must be combined with clinical observations, patient history, and epidemiological information. The expected result is Negative.  Fact Sheet for Patients:  EntrepreneurPulse.com.au  Fact Sheet for Healthcare Providers:  IncredibleEmployment.be  This test is no t yet approved or cleared by the [place] FDA and  has been authorized for detection and/or diagnosis of SARS-CoV-2 by FDA under an Emergency Use Authorization (EUA). This EUA will remain  in effect (meaning this test can be used) for the duration of the COVID-19 declaration under Section 564(b)(1) of the Act, 21 U.S.C.section 360bbb-3(b)(1), unless the authorization is terminated  or revoked sooner.       Influenza A by PCR NEGATIVE NEGATIVE Final   Influenza B by PCR NEGATIVE NEGATIVE Final    Comment: (NOTE) The Xpert Xpress SARS-CoV-2/FLU/RSV plus assay is intended as an aid in the diagnosis of influenza from Nasopharyngeal swab specimens and should not be used as a sole basis for treatment. Nasal washings and aspirates are unacceptable for Xpert Xpress SARS-CoV-2/FLU/RSV testing.  Fact Sheet for Patients: EntrepreneurPulse.com.au  Fact Sheet for Healthcare Providers: IncredibleEmployment.be  This test is not yet approved or cleared by the [place] FDA and has been authorized for detection and/or diagnosis of SARS-CoV-2 by FDA under an Emergency Use Authorization (EUA). This EUA will remain in effect (meaning this test can be used) for the duration of the COVID-19 declaration under Section 564(b)(1) of the Act, 21 U.S.C. section 360bbb-3(b)(1), unless the authorization is terminated or revoked.  Performed at [facility], [place] [address] [place], [place] [address]      Medications:   . apixaban  2.5 mg Oral BID  . chlorhexidine  15 mL Mouth Rinse BID  . furosemide  80  mg Intravenous BID  . insulin aspart  0-15 Units Subcutaneous  TID WC  . insulin aspart  0-5 Units Subcutaneous QHS  . insulin detemir  5 Units Subcutaneous BID  . mouth rinse  15 mL Mouth Rinse q12n4p   Continuous Infusions: . cefTRIAXone (ROCEPHIN)  IV 1 g (02/07/20 0609)      LOS: 4 days   Charlynne Cousins  [facility]  02/07/2020, 9:44 AM

## 2020-02-07 NOTE — Progress Notes (Signed)
   02/07/20 0400  Assess: MEWS Score  Temp 97.6 F (36.4 C)  BP 97/61  Pulse Rate 78  ECG Heart Rate 78  Resp 17  SpO2 99 %  O2 Device Nasal Cannula  O2 Flow Rate (L/min) 5 L/min  Assess: MEWS Score  MEWS Temp 0  MEWS Systolic 1  MEWS Pulse 0  MEWS RR 0  MEWS LOC 1  MEWS Score 2  MEWS Score Color Yellow  Assess: if the MEWS score is Yellow or Red  Were vital signs taken at a resting state? Yes  Focused Assessment Change from prior assessment (see assessment flowsheet)  Early Detection of Sepsis Score *See Row Information* Low  MEWS guidelines implemented *See Row Information* No, other (Comment) (See attached note for information)  Document  Patient Outcome Stabilized after interventions  Progress note created (see row info) Yes  Patient level of consciousness has recovered; though she remains disoriented, she is now alert.

## 2020-02-07 NOTE — Progress Notes (Signed)
Physical Therapy Treatment Patient Details Name: Lisa [name] MRN: [id number] DOB: 1935/09/22 Today's Date: 02/07/2020    History of Present Illness 85 y.o. female non-English-speaking past medical history significant for paroxysmal atrial fibrillation on Xarelto, CAD, ischemic dilated cardiomyopathy with an EF of 25% status post AICD chronic respiratory failure on 2 L of oxygen chronic kidney disease stage IV insulin-dependent diabetes mellitus type 2 coronary artery disease presents via EMS for lower extremity edema.  Chest x-ray was done that showed pulmonary edema.    PT Comments    Pt unable to follow commands via [place] interpreter (likely due to [employer] and level of arousal), however, pt able to verbalize needs, which were interpreted I.e. wanting to drink water. Pt requiring maximal assist for bed mobility, moderate assist for transfers. SpO2 96-100% on 2L O2. Pt unable to ambulate due to weakness. Continue to recommend SNF for ongoing Physical Therapy.     [employer] [place] #[id number] utilized for this session    Follow Up Recommendations  SNF;Supervision/Assistance - 24 hour     Equipment Recommendations  Wheelchair (measurements PT);Wheelchair cushion (measurements PT);Hospital bed    Recommendations for Other Services       Precautions / Restrictions Precautions Precautions: Fall Restrictions Weight Bearing Restrictions: No    Mobility  Bed Mobility Overal bed mobility: Needs Assistance Bed Mobility: Supine to Sit     Supine to sit: Max assist     General bed mobility comments: MaxA for initiation and execution, pt with difficulty command following  Transfers Overall transfer level: Needs assistance Equipment used: None Transfers: Sit to/from [employer] Transfers Sit to Stand: Mod assist Stand pivot transfers: Mod assist       General transfer comment: Heavy modA to stand and pivot towards right via face to face transfer. Pt unable to command follow to utilize  walker  Ambulation/Gait                 Stairs             Wheelchair Mobility    Modified Rankin (Stroke Patients Only)       Balance Overall balance assessment: Needs assistance Sitting-balance support: Feet supported;Bilateral upper extremity supported Sitting balance-Leahy Scale: Poor Sitting balance - Comments: Min guard for safety, intermittent right lateral lean as pt attempting to lie back down   Standing balance support: Bilateral upper extremity supported Standing balance-Leahy Scale: Poor Standing balance comment: modA with BUE support of PT                            Cognition Arousal/Alertness: Lethargic Behavior During Therapy: Flat affect Overall Cognitive Status: Difficult to assess                                 General Comments: Pt unable to follow commands through interpreter (likely impacted by [facility] and level of arousal). Intermittently closing eyes throughout session      Exercises      General Comments        Pertinent Vitals/Pain Pain Assessment: Faces Faces Pain Scale: No hurt Pain Location: did not verbalize pain    Home Living                      Prior Function            PT Goals (current goals can now be found in the care  plan section) Acute Rehab PT Goals Patient Stated Goal: unable to state PT Goal Formulation: With patient Time For Goal Achievement: 02/19/20 Potential to Achieve Goals: Fair    Frequency    Min 2X/week      PT Plan Current plan remains appropriate    Co-evaluation              AM-PAC PT "6 Clicks" Mobility   Outcome Measure  Help needed turning from your back to your side while in a flat bed without using bedrails?: Total Help needed moving from lying on your back to sitting on the side of a flat bed without using bedrails?: Total Help needed moving to and from a bed to a chair (including a wheelchair)?: A Lot Help needed standing up from a  chair using your arms (e.g., wheelchair or bedside chair)?: A Lot Help needed to walk in hospital room?: Total Help needed climbing 3-5 steps with a railing? : Total 6 Click Score: 8    End of Session Equipment Utilized During Treatment: Oxygen Activity Tolerance: Other (comment) (limited by [place] and language barrier) Patient left: with call bell/phone within reach;in chair;with chair alarm set Nurse Communication: Mobility status PT Visit Diagnosis: Unsteadiness on feet (R26.81);Other abnormalities of gait and mobility (R26.89);Muscle weakness (generalized) (M62.81)     Time: 9983-3825 PT Time Calculation (min) (ACUTE ONLY): 26 min  Charges:  $Therapeutic Activity: 23-37 mins                     Wyona Almas, PT, DPT Acute Rehabilitation Services Pager [phone] Office [phone]    Deno Etienne 02/07/2020, 3:17 PM

## 2020-02-07 NOTE — Consult Note (Addendum)
Advanced Heart Failure Team Consult Note   Primary Physician: Colonel Bald, MD PCP-Cardiologist:  No primary care provider on file.  Reason for Consultation: Dr Gwenlyn Found   HPI:    Lisa [name] is seen today for evaluation of heart failure  at the request of Dr Gwenlyn Found.   She does not speak english.   Lisa [name] is a 85 y.o. [place] female with a hx of CAD, ICM s/p ICD, chronic systolic CHF, DM, chronic respiratory failure, CKD IV, DM, HTN, persistent atrial fibrillation and OSA.   Cath in 08/2015 showed occluded dLAD, not amenable to PCI. Has ischemic cardiomyopathy, long standing, with EF in 25-30%. She underwent Implantation of dual-chamber [employer] defibrillator 08/2016.  History obtained from the chart. Last 4 hospital admit SNF recommended but she has been discharged to home with son.   Admitted to [facility] 33/82-50/53/9767 with A/C systolic heart failure.  Admitted to [facility] 11/8/-1123/2021 with acute hypoxic respiratory failure and A/C systolic heart failure. Diuresed with IV lasix and transitioned to torsemide.   Admitted to [facility] 12/3- 34/19/3790 with A/C systolic heart failure. Diuresed with lasix drip then transitioned to torsemide.    Presented on 12/31 with lower extremity. BNP 1118. CXR with interstitial edema. SARS negative. Diuresed with IV lasix with worsening renal function. Given IV fluids on 1/2.  Cardiology consulted. Creatinine continues to rise.    Lab Results  Component Value Date   CREATININE 3.50 (H) 02/07/2020   CREATININE 3.46 (H) 02/06/2020   CREATININE 3.26 (H) 02/05/2020    Echo 12/20221  EF 25-30%  RV mildly reduced.   Review of Systems: [y] = yes, [ ]  = no Obtained from the chart  . General: Weight gain [ T]; Weight loss [ ] ; Anorexia [ ] ; Fatigue [Y]; Fever [ ] ; Chills [ ] ; Weakness [ ]   . Cardiac: Chest pain/pressure [ ] ; Resting SOB [ Y]; Exertional SOB [Y ]; Orthopnea [ ] ; Pedal Edema [ ] ; Palpitations [ ] ; Syncope [ ] ;  Presyncope [ ] ; Paroxysmal nocturnal dyspnea[ ]   . Pulmonary: Cough [ ] ; Wheezing[ ] ; Hemoptysis[ ] ; Sputum [ ] ; Snoring [ ]   . GI: Vomiting[ ] ; Dysphagia[ ] ; Melena[ ] ; Hematochezia [ ] ; Heartburn[ ] ; Abdominal pain [ ] ; Constipation [ ] ; Diarrhea [ ] ; BRBPR [ ]   . GU: Hematuria[ ] ; Dysuria [ ] ; Nocturia[ ]   . Vascular: Pain in legs with walking [ ] ; Pain in feet with lying flat [ ] ; Non-healing sores [ ] ; Stroke [ ] ; TIA [ ] ; Slurred speech [ ] ;  . Neuro: Headaches[ ] ; Vertigo[ ] ; Seizures[ ] ; Paresthesias[ ] ;Blurred vision [ ] ; Diplopia [ ] ; Vision changes [ ]   . Ortho/Skin: Arthritis [ ] ; Joint pain [Y ]; Muscle pain [ ] ; Joint swelling [ ] ; Back Pain [ ] ; Rash [ ]   . Psych: Depression[ ] ; Anxiety[ ]   . Heme: Bleeding problems [ ] ; Clotting disorders [ ] ; Anemia [ ]   . Endocrine: Diabetes [Y ]; Thyroid dysfunction[ ]   Home Medications Prior to Admission medications   Medication Sig Start Date End Date Taking? Authorizing Provider  acetaminophen (TYLENOL) 500 MG tablet Take 500 mg by mouth every 6 (six) hours as needed for mild pain or headache.   Yes [provider]  albuterol (PROVENTIL) (2.5 MG/3ML) 0.083% nebulizer solution Inhale 3 mLs into the lungs every 6 (six) hours as needed for wheezing or shortness of breath. 04/27/18  Yes [provider]  amiodarone (PACERONE) 200 MG tablet Take 200  mg by mouth daily. 12/23/19  Yes [provider]  aspirin EC 81 MG tablet Take 81 mg by mouth daily. Swallow whole.   Yes [provider]  atorvastatin (LIPITOR) 20 MG tablet Take 20 mg by mouth daily.   Yes [provider]  ELIQUIS 2.5 MG TABS tablet Take 2.5 mg by mouth 2 (two) times daily. 09/26/19  Yes [provider]  FEROSUL 325 (65 Fe) MG tablet Take 325 mg by mouth every morning. 12/23/19  Yes [provider]  insulin aspart (NOVOLOG FLEXPEN) 100 UNIT/ML FlexPen Inject 12 Units into the skin 3 (three) times daily with meals. Pt uses  per sliding scale. Patient taking differently: Inject 8 Units into the skin 3 (three) times daily with meals. 04/11/19  Yes Shamleffer, Melanie Crazier, MD  isosorbide mononitrate (IMDUR) 60 MG 24 hr tablet Take 60 mg by mouth daily. 01/16/20  Yes [provider]  magnesium oxide (MAG-OX) 400 (241.3 Mg) MG tablet Take 400 mg by mouth daily. 11/25/19  Yes [provider]  metoprolol succinate (TOPROL-XL) 25 MG 24 hr tablet Take 25 mg by mouth daily. 11/29/19  Yes [provider]  omeprazole (PRILOSEC) 40 MG capsule Take 40 mg by mouth daily.   Yes [provider]  OXYGEN Inhale 5 % into the lungs at bedtime. Cpap   Yes [provider]  senna-docusate (SENOKOT-S) 8.6-50 MG tablet Take 2 tablets by mouth daily as needed for mild constipation.   Yes [provider]  spironolactone (ALDACTONE) 25 MG tablet Take 25 mg by mouth daily. 01/16/20  Yes [provider]  torsemide (DEMADEX) 20 MG tablet Take 20 mg by mouth every morning. 11/30/19  Yes [provider]  TRESIBA FLEXTOUCH 100 UNIT/ML FlexTouch Pen Inject 0.3 mLs (30 Units total) into the skin at bedtime. Patient taking differently: Inject 10 Units into the skin at bedtime. 04/20/19  Yes Shamleffer, Melanie Crazier, MD  butalbital-acetaminophen-caffeine (FIORICET, ESGIC) 50-325-40 MG tablet Take 1 tablet by mouth every 6 (six) hours as needed for headache. Patient not taking: No sig reported 09/25/15   Lavina Hamman, MD  carvedilol (COREG) 12.5 MG tablet Take 1 tablet (12.5 mg total) by mouth 2 (two) times daily with a meal. Patient not taking: No sig reported 09/25/15   Lavina Hamman, MD  docusate sodium (COLACE) 100 MG capsule Take 1 capsule (100 mg total) by mouth daily. Patient not taking: No sig reported 09/25/15   Lavina Hamman, MD  furosemide (LASIX) 40 MG tablet Take 1 tablet (40 mg total) by mouth 2 (two) times daily. Patient not taking: No sig reported 09/25/15    Lavina Hamman, MD  glucose blood (ACCU-CHEK GUIDE) test strip 1 each by Other route 4 (four) times daily. Use as instructed 04/11/19   Shamleffer, Melanie Crazier, MD  hydrALAZINE (APRESOLINE) 25 MG tablet Take 1.5 tablets (37.5 mg total) by mouth every 8 (eight) hours. Patient not taking: No sig reported 09/25/15   Lavina Hamman, MD  Insulin Pen Needle (BD PEN NEEDLE NANO U/F) 32G X 4 MM MISC Four times daily 12/18/17   Shamleffer, Melanie Crazier, MD  OXYGEN Inhale 2 L into the lungs continuous.    [provider]  polyethylene glycol (MIRALAX / GLYCOLAX) packet Take 17 g by mouth daily. Patient not taking: No sig reported 09/25/15   Lavina Hamman, MD  rivaroxaban (XARELTO) 15 MG TABS tablet Take 1 tablet (15 mg total) by mouth daily with supper. Patient  not taking: No sig reported 09/25/15   Lavina Hamman, MD  rosuvastatin (CRESTOR) 10 MG tablet Take 1 tablet (10 mg total) by mouth daily. Patient not taking: No sig reported 09/28/14   Isaiah Serge, NP    Past Medical History: Past Medical History:  Diagnosis Date  . Anemia   . Carotid arterial disease ([place])   . Chronic combined systolic and diastolic CHF (congestive heart failure) ([place])    a. 08/2014 Echo: EF 40-45%, no rwma, bicuspid AoV, mild AS, mod dil Asc Ao, PASP 48mmHg.  . CKD (chronic kidney disease), stage III ([place])   . Diabetes mellitus without complication ([place])   . Essential hypertension   . Mild aortic stenosis    a. 08/2014 Echo: mild AS, bicuspid AoV.  . OSA on CPAP   . Osteoarthritis of both ankles   . Persistent atrial fibrillation    a. Dx 08/2013, CHA2DS2VASc=5-->Xarelto.  . Vitamin D deficiency     Past Surgical History: Past Surgical History:  Procedure Laterality Date  . CHOLECYSTECTOMY      Family History: Family History  Problem Relation Age of Onset  . Diabetes Mother   . Hyperlipidemia Mother   . Hypertension Mother   . Stroke Sister   . Heart attack Neg Hx     Social  History: Social History   Socioeconomic History  . Marital status: Married    Spouse name: Not on file  . Number of children: Not on file  . Years of education: Not on file  . Highest education level: Not on file  Occupational History  . Not on file  Tobacco Use  . Smoking status: Former Research scientist (life sciences)  . Smokeless tobacco: Never Used  Substance and Sexual Activity  . Alcohol use: No  . Drug use: No  . Sexual activity: Not on file  Other Topics Concern  . Not on file  Social History Narrative  . Not on file   Social Determinants of Health   Financial Resource Strain: Not on file  Food Insecurity: Not on file  Transportation Needs: Not on file  Physical Activity: Not on file  Stress: Not on file  Social Connections: Not on file    Allergies:  Allergies  Allergen Reactions  . Lisinopril Cough    Objective:    Vital Signs:   Temp:  [97.6 F (36.4 C)-98.5 F (36.9 C)] 98.1 F (36.7 C) (01/04 0718) Pulse Rate:  [60-81] 62 (01/04 0718) Resp:  [14-23] 20 (01/04 0718) BP: (97-140)/(49-69) 140/69 (01/04 0718) SpO2:  [99 %-100 %] 100 % (01/04 0718) Last BM Date: 02/05/20  Weight change: Filed Weights   02/04/20 0422 02/05/20 0322 02/06/20 0523  Weight: 72 kg 76.3 kg 79.9 kg    Intake/Output:   Intake/Output Summary (Last 24 hours) at 02/07/2020 0910 Last data filed at 02/07/2020 0902 Gross per 24 hour  Intake 370 ml  Output 350 ml  Net 20 ml      Physical Exam    General:  Appears weak. Drowsy. No resp difficulty HEENT: normal Neck: supple. JVP . Carotids 2+ bilat; no bruits. No lymphadenopathy or thyromegaly appreciated. Cor: PMI nondisplaced. Irreular rate & rhythm. No rubs, gallops or murmurs. Lungs: clear Abdomen: soft, nontender, nondistended. No hepatosplenomegaly. No bruits or masses. Good bowel sounds. Extremities: no cyanosis, clubbing, rash, R and LLE 2+edema Neuro: Drowsy.   Telemetry   A fib  EKG    A fib 76 bpm   Labs   Basic Metabolic  Panel: Recent Labs  Lab 02/03/20 0219 02/03/20 0304 02/03/20 0305 02/03/20 0535 02/04/20 0152 02/05/20 0312 02/06/20 0355 02/07/20 0430  NA 137   < > 136 136 138 136 133* 134*  K 4.8   < > 4.7 4.7 4.2 4.2 5.0 5.0  CL 96*  --  96*  --  98 95* 92* 97*  CO2 26  --   --   --  27 29 24 24   GLUCOSE 155*  --  151*  --  127* 109* 185* 154*  BUN 87*  --  98*  --  86* 96* 102* 115*  CREATININE 3.03*  --  2.70*  --  2.85* 3.26* 3.46* 3.50*  CALCIUM 8.9  --   --   --  8.6* 8.5* 8.6* 8.5*  MG 2.3  --   --   --   --   --   --   --    < > = values in this interval not displayed.    Liver Function Tests: Recent Labs  Lab 02/03/20 0219  AST 25  ALT 17  ALKPHOS 59  BILITOT 1.1  PROT 6.9  ALBUMIN 3.9   No results for input(s): LIPASE, AMYLASE in the last 168 hours. No results for input(s): AMMONIA in the last 168 hours.  CBC: Recent Labs  Lab 02/03/20 0219 02/03/20 0304 02/03/20 0305 02/03/20 0535 02/03/20 1539 02/04/20 0152  WBC 10.3  --   --   --  15.8* 12.2*  NEUTROABS  --   --   --   --  13.4* 9.7*  HGB 11.7* 13.6 13.6 12.2 11.3* 10.8*  HCT 39.8 40.0 40.0 36.0 37.9 34.1*  MCV 93.2  --   --   --  91.1 90.9  PLT 176  --   --   --  117* 115*    Cardiac Enzymes: No results for input(s): CKTOTAL, CKMB, CKMBINDEX, TROPONINI in the last 168 hours.  BNP: BNP (last 3 results) Recent Labs    02/03/20 0221 02/05/20 1106  BNP 1,118.4* 1,233.0*    ProBNP (last 3 results) No results for input(s): PROBNP in the last 8760 hours.   CBG: Recent Labs  Lab 02/06/20 0716 02/06/20 1105 02/06/20 1611 02/06/20 2137 02/07/20 0615  GLUCAP 184* 194* 163* 165* 139*    Coagulation Studies: No results for input(s): LABPROT, INR in the last 72 hours.   Imaging    No results found.   Medications:     Current Medications: . apixaban  2.5 mg Oral BID  . chlorhexidine  15 mL Mouth Rinse BID  . furosemide  80 mg Intravenous BID  . insulin aspart  0-15 Units Subcutaneous  TID WC  . insulin aspart  0-5 Units Subcutaneous QHS  . insulin detemir  5 Units Subcutaneous BID  . mouth rinse  15 mL Mouth Rinse q12n4p     Infusions: . cefTRIAXone (ROCEPHIN)  IV 1 g (02/07/20 2130)       Patient Profile   Lisa [name] is a 85 y.o. female with a hx of CAD, ICM s/p ICD, chronic systolic CHF, DM, chronic respiratory failure, CKD IV, DM, HTN, persistent atrial fibrillation and OSA.   Assessment/Plan  1. Fever , LLE cellulitis On antibiotics  Per primary team.    2. A/C Systolic Heart Failure , ICM Echo 11/2019 [facility] EF 25-30%  RV normal  - Multiple admits over the last 6 months. Marked volume overload with poor response to IV lasix and worsening renal  function.  - Continue IV lasix.  3. AKI on CKD Stage IV Baseline creatinine < 2. Todays creatinine 3.5/BUN 115.  Doubt she is dialysis candidate.   4. A fib Rate controlled. On apixaban.   Difficult situation. Admitted with refractory A/C systolic heart failure and cardiorenal syndrome. Worsening renal function with IV diuresis and poor response. Could consider inotropes though doubt that will change the outcome. Not a candidate for advanced therapies with evidence of multisystem organ failure.    Needs palliative care for goals of care given limited options.   Length of Stay: 4  Amy Clegg, NP  02/07/2020, 9:10 AM  Advanced Heart Failure Team Pager [phone] (M-F; [place])  Please contact [facility] for night-coverage after hours (4p -7a ) and weekends on [email or web address]   Patient seen and examined with the above-signed Advanced Practice Provider and/or Housestaff. I personally reviewed laboratory data, imaging studies and relevant notes. I independently examined the patient and formulated the important aspects of the plan. I have edited the note to reflect any of my changes or salient points. I have personally discussed the plan with the patient and/or family.  Patient currently minimally  responsive. I reviewed records from [facility] and in [place] extensively.   85 y/o woman with multiple medical issues including CKD 3b, DM2, CAD, Chronic systolic HF due to iCM EF 25-30%, chronic AF  Has had multiple recent admits with refractory HF and AKI. Unable to be maintained at home. Attempted to contact family but have been non-responsive.  [facility] and [facility].    BUN now 115 and minimally responsive  General:  Elderly  Il appearing. Unresponsive HEENT: normal Neck: supple. JVP to ear  Carotids 2+ bilat; no bruits. No lymphadenopathy or thryomegaly appreciated. Cor: PMI nondisplaced. Irregular +s3 Lungs: crackles Abdomen: soft, nontender, nondistended. No hepatosplenomegaly. No bruits or masses. Good bowel sounds. Extremities: no cyanosis, clubbing, rash, 2-3+ edema Neuro: minimally responsive  She has end-stage HF with worsening renal function and what seems to be frank uremia. Given comorbidities, she is not a candidate for advanced HF therapies, nor does she seem to be a candidate for dialysis.   Would strongly recommend Palliative Care/Hospice. The HF team will sign-off. I d/w Dr. Gwenlyn Found.  Glori Bickers, MD  12:15 PM

## 2020-02-08 DIAGNOSIS — J9621 Acute and chronic respiratory failure with hypoxia: Secondary | ICD-10-CM | POA: Diagnosis not present

## 2020-02-08 DIAGNOSIS — R778 Other specified abnormalities of plasma proteins: Secondary | ICD-10-CM | POA: Diagnosis not present

## 2020-02-08 DIAGNOSIS — I5043 Acute on chronic combined systolic (congestive) and diastolic (congestive) heart failure: Secondary | ICD-10-CM | POA: Diagnosis not present

## 2020-02-08 DIAGNOSIS — N179 Acute kidney failure, unspecified: Secondary | ICD-10-CM | POA: Diagnosis not present

## 2020-02-08 LAB — BASIC METABOLIC PANEL
Anion gap: 16 — ABNORMAL HIGH (ref 5–15)
BUN: 123 mg/dL — ABNORMAL HIGH (ref 8–23)
CO2: 23 mmol/L (ref 22–32)
Calcium: 8.2 mg/dL — ABNORMAL LOW (ref 8.9–10.3)
Chloride: 93 mmol/L — ABNORMAL LOW (ref 98–111)
Creatinine, Ser: 3.28 mg/dL — ABNORMAL HIGH (ref 0.44–1.00)
GFR, Estimated: 14 mL/min — ABNORMAL LOW (ref >60)
Glucose, Bld: 164 mg/dL — ABNORMAL HIGH (ref 70–99)
Potassium: 4 mmol/L (ref 3.5–5.1)
Sodium: 132 mmol/L — ABNORMAL LOW (ref 135–145)

## 2020-02-08 LAB — GLUCOSE, CAPILLARY
Glucose-Capillary: 156 mg/dL — ABNORMAL HIGH (ref 70–99)
Glucose-Capillary: 115 mg/dL — ABNORMAL HIGH (ref 70–99)
Glucose-Capillary: 183 mg/dL — ABNORMAL HIGH (ref 70–99)
Glucose-Capillary: 301 mg/dL — ABNORMAL HIGH (ref 70–99)

## 2020-02-08 NOTE — Progress Notes (Signed)
Called by RN as husband was at bedside.  Husband adds his son Roanna Epley on the phone to translate for [place].   Husband wants to know how his wife is and when we think she will be discharged.   I explained that his wife has a very bad heart and now her kidneys are failing.  Tiho indicates that he and his father are aware of these facts.   I suggested they consider Hospice services for support in the home.  Tiho said No no no hospice.  I asked him why no hospice?  Tiho says when they have tried Hospice in the past the doctors stop doing for her and just leave her.    I tried to explain that her heart has become so bad that we can not fix it.  When we can't fix something we focus on just making her feel better.  That is what we are trying to do for your mom now.  We can't fix her heart so we are trying to focus on making her feel better.  This is what hospice does.  Tiho explained that he thought his mother was eligible to go to a short term skilled nursing facility and that this is what the family wants rather than Hospice.    After talking with Dr. Ree Kida I was about to tell Roanna Epley and his father that the patient would be here for several more days and we will look for a rehab facility for her.    I asked Tiho about code status for his mother.  If she stops breathing and her heart stops, do we aggressively attempt to resusciatate her?  Tiho replied, "No, no just let her go naturally".  I explained to Tiho that we need his help in translating for his mother.  She is not speaking to [place].  Tiho agreed to answer his phone at 10:00 each day to help translate between his mother and her care providers.   Florentina Jenny, PA-C Palliative Medicine Office:  [phone]

## 2020-02-08 NOTE — Progress Notes (Signed)
Upon dressing change for peripheral IV site RN noticed redness above site and pt expressed discomfort.  IV was removed MD notified  Will continue to monitor

## 2020-02-08 NOTE — Progress Notes (Signed)
PROGRESS NOTE    Lisa [name]  WER:[id number] DOB: November 16, 1935 DOA: 02/03/2020 PCP: Colonel Bald, MD   Brief Narrative:  HPI on 02/03/20 by Dr. Marlane Hatcher Dudash is a 85 y.o. female with medical history significant of paroxysmal A. fib on Xarelto, CAD, ischemic dilated cardiomyopathy status post AICD, chronic hypoxemic respiratory failure on 2 L home oxygen, history of cardiac arrest, pulmonary hypertension, CKD stage IV, insulin-dependent type 2 diabetes, hypertension, OSA on CPAP, carotid arterial disease presenting to the ED via EMS for evaluation of bilateral lower extremity edema.  No family available at this time.  [place] interpreter services used but patient is currently somnolent and not able to give any history.  She was recently admitted to [facility] for CHF exacerbation and echo revealed LVEF of 25 to 30%.  Interim history  Patient admitted with respiratory failure with acute on chronic CHF exacerbation and placed on IV Lasix.  CHF team was also consulted and has now signed off.  Palliative care consulted as well to discuss goals of care.  Patient also found to have possible left lower extremity cellulitis and placed on IV Rocephin. Assessment & Plan   Acute respiratory failure with hypoxia due to acute on chronic systolic CHF exacerbation -Echocardiogram on 01/27/2020 showed an EF of 25% -BNP continues to be elevated -Patient with volume overload on exam -Patient did require BiPAP during this admission -Heart failure team consulted and appreciated, recommended IV Lasix with palliative care/hospice recommendation.  Patient is not a candidate for advanced heart failure therapies nor she candidate for dialysis. -Continue to monitor intake and output, daily weights -Currently on IV Lasix 80 mg twice daily, will attempt to transition over the next few days  Sepsis secondary to possible left lower extremity cellulitis -Patient was noted to  have fever as well as leukocytosis on admission -Patient was started on IV Rocephin -Currently she is afebrile, leukocytosis is improved -Left lower extremity Doppler unremarkable for DVT  Acute kidney injury on chronic kidney disease, stage IV -Baseline creatinine 1.4-1.80 -Creatinine peaked to 3.5, currently down to 3.28 today  Paroxysmal atrial fibrillation -Continue Eliquis, amiodarone and metoprolol  Elevated troponin -Suspect demand ischemia secondary to the above  Mild hyponatremia -Sodium 132 -Suspect secondary to Lasix use -Continue to monitor closely  Essential hypertension -BP well controlled  Diabetes mellitus, type II -Hemoglobin A1c 7.1 -Continue Levemir, insulin sliding scale and CBG monitoring  Goals of care -As noted above, patient is not a candidate for advanced heart failure therapies or dialysis -Palliative care consulted and appreciated -Patient currently DNR   DVT Prophylaxis Eliquis  Code Status: DNR  Family Communication: None at bedside  Disposition Plan:  Status is: Inpatient  Remains inpatient appropriate because:IV treatments appropriate due to intensity of illness or inability to take PO   Dispo: The patient is from: Home              Anticipated d/c is to: SNF              Anticipated d/c date is: 2 days              Patient currently is not medically stable to d/c.   Consultants Cardiology Palliative care  Procedures  Lower extremity Doppler  Antibiotics   Anti-infectives (From admission, onward)   Start     Dose/Rate Route Frequency Ordered Stop   02/03/20 0730  ceFAZolin (ANCEF) IVPB 1 g/50 mL premix  Status:  Discontinued  1 g 100 mL/hr over 30 Minutes Intravenous Every 8 hours 02/03/20 0716 02/03/20 0720   02/03/20 0515  cefTRIAXone (ROCEPHIN) 1 g in sodium chloride 0.9 % 100 mL IVPB        1 g 200 mL/hr over 30 Minutes Intravenous Every 24 hours 02/03/20 0500        Subjective:   Lisa [name] seen and  examined today.  2 different translators were used.  Patient was not very interactive with either of them.  Simply stated her name that she want to go home.  Objective:   Vitals:   02/08/20 0009 02/08/20 0528 02/08/20 0814 02/08/20 1143  BP:  119/71 (!) 114/54 (!) 100/46  Pulse:  87 63 66  Resp:  18 19 16   Temp:   98 F (36.7 C) 98.2 F (36.8 C)  TempSrc:   Oral Oral  SpO2:  100% 100% 100%  Weight: 79.4 kg     Height:        Intake/Output Summary (Last 24 hours) at 02/08/2020 1402 Last data filed at 02/08/2020 0024 Gross per 24 hour  Intake 520 ml  Output 700 ml  Net -180 ml   Filed Weights   02/05/20 0322 02/06/20 0523 02/08/20 0009  Weight: 76.3 kg 79.9 kg 79.4 kg    Exam  General: Well developed, chronically ill-appearing, NAD  HEENT: NCAT, mucous membranes moist.   Cardiovascular: S1 S2 auscultated, irregular.  Respiratory: Diminished breath sounds however no wheezing noted  Abdomen: Soft, nontender, nondistended, + bowel sounds  Extremities: warm dry without cyanosis clubbing. LLE edema  Neuro: Awake and alert, oriented to self although patient does not wish to answer any further questions and states she wants to go home.  Appears to be nonfocal.   Data Reviewed: I have personally reviewed following labs and imaging studies  CBC: Recent Labs  Lab 02/03/20 0219 02/03/20 0304 02/03/20 0305 02/03/20 0535 02/03/20 1539 02/04/20 0152  WBC 10.3  --   --   --  15.8* 12.2*  NEUTROABS  --   --   --   --  13.4* 9.7*  HGB 11.7* 13.6 13.6 12.2 11.3* 10.8*  HCT 39.8 40.0 40.0 36.0 37.9 34.1*  MCV 93.2  --   --   --  91.1 90.9  PLT 176  --   --   --  117* 664*   Basic Metabolic Panel: Recent Labs  Lab 02/03/20 0219 02/03/20 0304 02/04/20 0152 02/05/20 0312 02/06/20 0355 02/07/20 0430 02/08/20 0201  NA 137   < > 138 136 133* 134* 132*  K 4.8   < > 4.2 4.2 5.0 5.0 4.0  CL 96*   < > 98 95* 92* 97* 93*  CO2 26  --  27 29 24 24 23   GLUCOSE 155*   < > 127*  109* 185* 154* 164*  BUN 87*   < > 86* 96* 102* 115* 123*  CREATININE 3.03*   < > 2.85* 3.26* 3.46* 3.50* 3.28*  CALCIUM 8.9  --  8.6* 8.5* 8.6* 8.5* 8.2*  MG 2.3  --   --   --   --   --   --    < > = values in this interval not displayed.   GFR: Estimated Creatinine Clearance: 13.5 mL/min (A) (by C-G formula based on SCr of 3.28 mg/dL (H)). Liver Function Tests: Recent Labs  Lab 02/03/20 0219  AST 25  ALT 17  ALKPHOS 59  BILITOT 1.1  PROT 6.9  ALBUMIN 3.9   No results for input(s): LIPASE, AMYLASE in the last 168 hours. No results for input(s): AMMONIA in the last 168 hours. Coagulation Profile: No results for input(s): INR, PROTIME in the last 168 hours. Cardiac Enzymes: No results for input(s): CKTOTAL, CKMB, CKMBINDEX, TROPONINI in the last 168 hours. BNP (last 3 results) No results for input(s): PROBNP in the last 8760 hours. HbA1C: No results for input(s): HGBA1C in the last 72 hours. CBG: Recent Labs  Lab 02/07/20 1107 02/07/20 1615 02/07/20 2040 02/08/20 0648 02/08/20 1215  GLUCAP 117* 154* 240* 156* 115*   Lipid Profile: No results for input(s): CHOL, HDL, LDLCALC, TRIG, CHOLHDL, LDLDIRECT in the last 72 hours. Thyroid Function Tests: No results for input(s): TSH, T4TOTAL, FREET4, T3FREE, THYROIDAB in the last 72 hours. Anemia Panel: No results for input(s): VITAMINB12, FOLATE, FERRITIN, TIBC, IRON, RETICCTPCT in the last 72 hours. Urine analysis:    Component Value Date/Time   COLORURINE YELLOW 02/03/2020 0645   APPEARANCEUR CLEAR 02/03/2020 0645   LABSPEC 1.009 02/03/2020 0645   PHURINE 5.0 02/03/2020 0645   GLUCOSEU NEGATIVE 02/03/2020 0645   HGBUR SMALL (A) 02/03/2020 0645   BILIRUBINUR NEGATIVE 02/03/2020 0645   KETONESUR NEGATIVE 02/03/2020 0645   PROTEINUR NEGATIVE 02/03/2020 0645   UROBILINOGEN 0.2 08/23/2014 1838   NITRITE NEGATIVE 02/03/2020 0645   LEUKOCYTESUR MODERATE (A) 02/03/2020 0645   Sepsis  Labs: @LABRCNTIP (procalcitonin:4,lacticidven:4)  ) Recent Results (from the past 240 hour(s))  Resp Panel by RT-PCR (Flu A&B, Covid) Nasopharyngeal Swab     Status: None   Collection Time: 02/03/20  2:25 AM   Specimen: Nasopharyngeal Swab; Nasopharyngeal(NP) swabs in vial transport medium  Result Value Ref Range Status   SARS Coronavirus 2 by RT PCR NEGATIVE NEGATIVE Final    Comment: (NOTE) SARS-CoV-2 target nucleic acids are NOT DETECTED.  The SARS-CoV-2 RNA is generally detectable in upper respiratory specimens during the acute phase of infection. The lowest concentration of SARS-CoV-2 viral copies this assay can detect is 138 copies/mL. A negative result does not preclude SARS-Cov-2 infection and should not be used as the sole basis for treatment or other patient management decisions. A negative result may occur with  improper specimen collection/handling, submission of specimen other than nasopharyngeal swab, presence of viral mutation(s) within the areas targeted by this assay, and inadequate number of viral copies(<138 copies/mL). A negative result must be combined with clinical observations, patient history, and epidemiological information. The expected result is Negative.  Fact Sheet for Patients:  EntrepreneurPulse.com.au  Fact Sheet for Healthcare Providers:  IncredibleEmployment.be  This test is no t yet approved or cleared by the [place] FDA and  has been authorized for detection and/or diagnosis of SARS-CoV-2 by FDA under an Emergency Use Authorization (EUA). This EUA will remain  in effect (meaning this test can be used) for the duration of the COVID-19 declaration under Section 564(b)(1) of the Act, 21 U.S.C.section 360bbb-3(b)(1), unless the authorization is terminated  or revoked sooner.       Influenza A by PCR NEGATIVE NEGATIVE Final   Influenza B by PCR NEGATIVE NEGATIVE Final    Comment: (NOTE) The Xpert  Xpress SARS-CoV-2/FLU/RSV plus assay is intended as an aid in the diagnosis of influenza from Nasopharyngeal swab specimens and should not be used as a sole basis for treatment. Nasal washings and aspirates are unacceptable for Xpert Xpress SARS-CoV-2/FLU/RSV testing.  Fact Sheet for Patients: EntrepreneurPulse.com.au  Fact Sheet for Healthcare Providers: IncredibleEmployment.be  This test is not yet approved  or cleared by the [place] and has been authorized for detection and/or diagnosis of SARS-CoV-2 by FDA under an Emergency Use Authorization (EUA). This EUA will remain in effect (meaning this test can be used) for the duration of the COVID-19 declaration under Section 564(b)(1) of the Act, 21 U.S.C. section 360bbb-3(b)(1), unless the authorization is terminated or revoked.  Performed at [facility], [place] [address] [place], [place] [address]       Radiology Studies: No results found.   Scheduled Meds: . (feeding supplement) PROSource Plus  30 mL Oral BID BM  . apixaban  2.5 mg Oral BID  . chlorhexidine  15 mL Mouth Rinse BID  . furosemide  80 mg Intravenous BID  . insulin aspart  0-15 Units Subcutaneous TID WC  . insulin aspart  0-5 Units Subcutaneous QHS  . insulin detemir  5 Units Subcutaneous BID  . mouth rinse  15 mL Mouth Rinse q12n4p  . multivitamin with minerals  1 tablet Oral Daily   Continuous Infusions: . cefTRIAXone (ROCEPHIN)  IV 1 g (02/08/20 0618)     LOS: 5 days   Time Spent in minutes   45 minutes  Massie Cogliano D.O. on 02/08/2020 at 2:02 PM  Between 7am to 7pm - Please see pager noted on [email or web address]  After 7pm go to [email or web address]  And look for the night coverage person covering for me after hours  [facility] Office  [phone]

## 2020-02-08 NOTE — TOC Progression Note (Addendum)
Transition of Care [facility]) - Progression Note    Patient Details  Name: Rmoni Keplinger MRN: [id number] Date of Birth: [birth year]  Transition of Care [facility]) CM/SW [place], [place] Phone Number: 02/08/2020, 1:49 PM  Clinical Narrative:     CSW met with pt and pt husband bedside. CSW used virtual interpreter to speak with pt husband. CSW explained SNF recommendation and provided husband with SNF offer list. Husband to review with pt son and discuss SNF choice. CSW inquired about pt's level of understanding of situation. Husband states that his wife is hard of hearing and also that he does not believe she is comprehending the situation. CSW to follow up on SNF choice.   CSW attempted to call pt son; no answer, no voicemail available.   Expected Discharge Plan: [place] Barriers to Discharge: Continued Medical Work up  Expected Discharge Plan and Services Expected Discharge Plan: [place] arrangements for the past 2 months: Single Family Home                                       Social Determinants of Health (SDOH) Interventions    Readmission Risk Interventions No flowsheet data found.

## 2020-02-09 DIAGNOSIS — N179 Acute kidney failure, unspecified: Secondary | ICD-10-CM | POA: Diagnosis not present

## 2020-02-09 DIAGNOSIS — R778 Other specified abnormalities of plasma proteins: Secondary | ICD-10-CM | POA: Diagnosis not present

## 2020-02-09 DIAGNOSIS — J9621 Acute and chronic respiratory failure with hypoxia: Secondary | ICD-10-CM | POA: Diagnosis not present

## 2020-02-09 DIAGNOSIS — I5043 Acute on chronic combined systolic (congestive) and diastolic (congestive) heart failure: Secondary | ICD-10-CM | POA: Diagnosis not present

## 2020-02-09 LAB — GLUCOSE, CAPILLARY
Glucose-Capillary: 185 mg/dL — ABNORMAL HIGH (ref 70–99)
Glucose-Capillary: 153 mg/dL — ABNORMAL HIGH (ref 70–99)
Glucose-Capillary: 179 mg/dL — ABNORMAL HIGH (ref 70–99)
Glucose-Capillary: 143 mg/dL — ABNORMAL HIGH (ref 70–99)

## 2020-02-09 LAB — BASIC METABOLIC PANEL
Anion gap: 12 (ref 5–15)
BUN: 115 mg/dL — ABNORMAL HIGH (ref 8–23)
CO2: 28 mmol/L (ref 22–32)
Calcium: 8.3 mg/dL — ABNORMAL LOW (ref 8.9–10.3)
Chloride: 96 mmol/L — ABNORMAL LOW (ref 98–111)
Creatinine, Ser: 2.99 mg/dL — ABNORMAL HIGH (ref 0.44–1.00)
GFR, Estimated: 15 mL/min — ABNORMAL LOW (ref >60)
Glucose, Bld: 126 mg/dL — ABNORMAL HIGH (ref 70–99)
Potassium: 4.1 mmol/L (ref 3.5–5.1)
Sodium: 136 mmol/L (ref 135–145)

## 2020-02-09 NOTE — Progress Notes (Signed)
PROGRESS NOTE    Lisa [name]  JJH:[id number] DOB: [birth year] DOA: 02/03/2020 PCP: Colonel Bald, MD   Brief Narrative:  HPI on 02/03/20 by Dr. Marlane Hatcher Lisa [name] is a 85 y.o. female with medical history significant of paroxysmal A. fib on Xarelto, CAD, ischemic dilated cardiomyopathy status post AICD, chronic hypoxemic respiratory failure on 2 L home oxygen, history of cardiac arrest, pulmonary hypertension, CKD stage IV, insulin-dependent type 2 diabetes, hypertension, OSA on CPAP, carotid arterial disease presenting to the ED via EMS for evaluation of bilateral lower extremity edema.  No family available at this time.  [place] interpreter services used but patient is currently somnolent and not able to give any history.  She was recently admitted to [facility] for CHF exacerbation and echo revealed LVEF of 25 to 30%.  Interim history  Patient admitted with respiratory failure with acute on chronic CHF exacerbation and placed on IV Lasix.  CHF team was also consulted and has now signed off.  Palliative care consulted as well to discuss goals of care.  Patient also found to have possible left lower extremity cellulitis and placed on IV Rocephin. Assessment & Plan   Acute respiratory failure with hypoxia due to acute on chronic systolic CHF exacerbation -Echocardiogram on 01/27/2020 showed an EF of 25% -BNP continues to be elevated -Patient with volume overload on exam -Patient did require BiPAP during this admission -Heart failure team consulted and appreciated, recommended IV Lasix with palliative care/hospice recommendation.  Patient is not a candidate for advanced heart failure therapies nor she candidate for dialysis. -Continue to monitor intake and output, daily weights (doubt weights are accurate) -Currently on IV Lasix 80 mg twice daily, will attempt to transition over the next few days  Sepsis secondary to possible left lower extremity  cellulitis/ R arm IV infiltration -Patient was noted to have fever as well as leukocytosis on admission -Patient was started on IV Rocephin -Currently she is afebrile, leukocytosis is improved -Left lower extremity Doppler unremarkable for DVT -RUE appears to have an area where the IV infiltrated- treat symptomatically  Acute kidney injury on chronic kidney disease, stage IV -Baseline creatinine 1.4-1.80 -Creatinine peaked to 3.5, currently down to 2.99 today  Paroxysmal atrial fibrillation -Continue Eliquis, amiodarone and metoprolol  Elevated troponin -Suspect demand ischemia secondary to the above  Mild hyponatremia -Sodium 136 -Continue to monitor closely  Essential hypertension -BP well controlled  Diabetes mellitus, type II -Hemoglobin A1c 7.1 -Continue Levemir, insulin sliding scale and CBG monitoring  Goals of care -As noted above, patient is not a candidate for advanced heart failure therapies or dialysis -Palliative care consulted and appreciated -Patient currently DNR -Plan for discharge to SNF with palliative to follow   DVT Prophylaxis Eliquis  Code Status: DNR  Family Communication: None at bedside  Disposition Plan:  Status is: Inpatient  Remains inpatient appropriate because:IV treatments appropriate due to intensity of illness or inability to take PO   Dispo: The patient is from: Home              Anticipated d/c is to: SNF              Anticipated d/c date is: 2 days              Patient currently is not medically stable to d/c.   Consultants Cardiology Palliative care  Procedures  Lower extremity Doppler  Antibiotics   Anti-infectives (From admission, onward)   Start     Dose/Rate  Route Frequency Ordered Stop   02/03/20 0730  ceFAZolin (ANCEF) IVPB 1 g/50 mL premix  Status:  Discontinued        1 g 100 mL/hr over 30 Minutes Intravenous Every 8 hours 02/03/20 0716 02/03/20 0720   02/03/20 0515  cefTRIAXone (ROCEPHIN) 1 g in sodium  chloride 0.9 % 100 mL IVPB        1 g 200 mL/hr over 30 Minutes Intravenous Every 24 hours 02/03/20 0500        Subjective:   Lisa [name] seen and examined today.  Patient was stating something this morning however when I was able to get the translator on the phone, she stopped speaking.  She then turned her face the other way and check her eyes.    Objective:   Vitals:   02/09/20 0440 02/09/20 0441 02/09/20 0443 02/09/20 0800  BP:   109/67   Pulse:   87 85  Resp: 18 19 20    Temp:   97.9 F (36.6 C) 98.3 F (36.8 C)  TempSrc:   Oral Oral  SpO2:   98% 100%  Weight:      Height:        Intake/Output Summary (Last 24 hours) at 02/09/2020 1040 Last data filed at 02/09/2020 0450 Gross per 24 hour  Intake 840 ml  Output 1725 ml  Net -885 ml   Filed Weights   02/06/20 0523 02/08/20 0009 02/09/20 0000  Weight: 79.9 kg 79.4 kg 78.1 kg   Exam  General: Well developed, chronically ill-appearing, NAD  HEENT: NCAT, mucous membranes moist.   Cardiovascular: S1 S2 auscultated, irregular  Respiratory: Diminished breath sounds, no wheezing  Abdomen: Soft, nontender, nondistended, + bowel sounds  Extremities: warm dry without cyanosis clubbing.  LLE edema.  RUE bruising  Neuro: Awake and alert however does not wish to speak or answer any questions when using a translator  Psych: cannot assess   Data Reviewed: I have personally reviewed following labs and imaging studies  CBC: Recent Labs  Lab 02/03/20 0219 02/03/20 0304 02/03/20 0305 02/03/20 0535 02/03/20 1539 02/04/20 0152  WBC 10.3  --   --   --  15.8* 12.2*  NEUTROABS  --   --   --   --  13.4* 9.7*  HGB 11.7* 13.6 13.6 12.2 11.3* 10.8*  HCT 39.8 40.0 40.0 36.0 37.9 34.1*  MCV 93.2  --   --   --  91.1 90.9  PLT 176  --   --   --  117* 371*   Basic Metabolic Panel: Recent Labs  Lab 02/03/20 0219 02/03/20 0304 02/05/20 0312 02/06/20 0355 02/07/20 0430 02/08/20 0201 02/09/20 0343  NA 137   < > 136  133* 134* 132* 136  K 4.8   < > 4.2 5.0 5.0 4.0 4.1  CL 96*   < > 95* 92* 97* 93* 96*  CO2 26   < > 29 24 24 23 28   GLUCOSE 155*   < > 109* 185* 154* 164* 126*  BUN 87*   < > 96* 102* 115* 123* 115*  CREATININE 3.03*   < > 3.26* 3.46* 3.50* 3.28* 2.99*  CALCIUM 8.9   < > 8.5* 8.6* 8.5* 8.2* 8.3*  MG 2.3  --   --   --   --   --   --    < > = values in this interval not displayed.   GFR: Estimated Creatinine Clearance: 14.7 mL/min (A) (by C-G formula based on  SCr of 2.99 mg/dL (H)). Liver Function Tests: Recent Labs  Lab 02/03/20 0219  AST 25  ALT 17  ALKPHOS 59  BILITOT 1.1  PROT 6.9  ALBUMIN 3.9   No results for input(s): LIPASE, AMYLASE in the last 168 hours. No results for input(s): AMMONIA in the last 168 hours. Coagulation Profile: No results for input(s): INR, PROTIME in the last 168 hours. Cardiac Enzymes: No results for input(s): CKTOTAL, CKMB, CKMBINDEX, TROPONINI in the last 168 hours. BNP (last 3 results) No results for input(s): PROBNP in the last 8760 hours. HbA1C: No results for input(s): HGBA1C in the last 72 hours. CBG: Recent Labs  Lab 02/08/20 0648 02/08/20 1215 02/08/20 1655 02/08/20 2132 02/09/20 0618  GLUCAP 156* 115* 183* 301* 185*   Lipid Profile: No results for input(s): CHOL, HDL, LDLCALC, TRIG, CHOLHDL, LDLDIRECT in the last 72 hours. Thyroid Function Tests: No results for input(s): TSH, T4TOTAL, FREET4, T3FREE, THYROIDAB in the last 72 hours. Anemia Panel: No results for input(s): VITAMINB12, FOLATE, FERRITIN, TIBC, IRON, RETICCTPCT in the last 72 hours. Urine analysis:    Component Value Date/Time   COLORURINE YELLOW 02/03/2020 0645   APPEARANCEUR CLEAR 02/03/2020 0645   LABSPEC 1.009 02/03/2020 0645   PHURINE 5.0 02/03/2020 0645   GLUCOSEU NEGATIVE 02/03/2020 0645   HGBUR SMALL (A) 02/03/2020 0645   BILIRUBINUR NEGATIVE 02/03/2020 0645   KETONESUR NEGATIVE 02/03/2020 0645   PROTEINUR NEGATIVE 02/03/2020 0645   UROBILINOGEN 0.2  08/23/2014 1838   NITRITE NEGATIVE 02/03/2020 0645   LEUKOCYTESUR MODERATE (A) 02/03/2020 0645   Sepsis Labs: @LABRCNTIP (procalcitonin:4,lacticidven:4)  ) Recent Results (from the past 240 hour(s))  Resp Panel by RT-PCR (Flu A&B, Covid) Nasopharyngeal Swab     Status: None   Collection Time: 02/03/20  2:25 AM   Specimen: Nasopharyngeal Swab; Nasopharyngeal(NP) swabs in vial transport medium  Result Value Ref Range Status   SARS Coronavirus 2 by RT PCR NEGATIVE NEGATIVE Final    Comment: (NOTE) SARS-CoV-2 target nucleic acids are NOT DETECTED.  The SARS-CoV-2 RNA is generally detectable in upper respiratory specimens during the acute phase of infection. The lowest concentration of SARS-CoV-2 viral copies this assay can detect is 138 copies/mL. A negative result does not preclude SARS-Cov-2 infection and should not be used as the sole basis for treatment or other patient management decisions. A negative result may occur with  improper specimen collection/handling, submission of specimen other than nasopharyngeal swab, presence of viral mutation(s) within the areas targeted by this assay, and inadequate number of viral copies(<138 copies/mL). A negative result must be combined with clinical observations, patient history, and epidemiological information. The expected result is Negative.  Fact Sheet for Patients:  EntrepreneurPulse.com.au  Fact Sheet for Healthcare Providers:  IncredibleEmployment.be  This test is no t yet approved or cleared by the [place] FDA and  has been authorized for detection and/or diagnosis of SARS-CoV-2 by FDA under an Emergency Use Authorization (EUA). This EUA will remain  in effect (meaning this test can be used) for the duration of the COVID-19 declaration under Section 564(b)(1) of the Act, 21 U.S.C.section 360bbb-3(b)(1), unless the authorization is terminated  or revoked sooner.       Influenza A by  PCR NEGATIVE NEGATIVE Final   Influenza B by PCR NEGATIVE NEGATIVE Final    Comment: (NOTE) The Xpert Xpress SARS-CoV-2/FLU/RSV plus assay is intended as an aid in the diagnosis of influenza from Nasopharyngeal swab specimens and should not be used as a sole basis for treatment. Nasal  washings and aspirates are unacceptable for Xpert Xpress SARS-CoV-2/FLU/RSV testing.  Fact Sheet for Patients: EntrepreneurPulse.com.au  Fact Sheet for Healthcare Providers: IncredibleEmployment.be  This test is not yet approved or cleared by the [place] FDA and has been authorized for detection and/or diagnosis of SARS-CoV-2 by FDA under an Emergency Use Authorization (EUA). This EUA will remain in effect (meaning this test can be used) for the duration of the COVID-19 declaration under Section 564(b)(1) of the Act, 21 U.S.C. section 360bbb-3(b)(1), unless the authorization is terminated or revoked.  Performed at [facility], [place] [address] [place], [place] [address]       Radiology Studies: No results found.   Scheduled Meds:  (feeding supplement) PROSource Plus  30 mL Oral BID BM   apixaban  2.5 mg Oral BID   chlorhexidine  15 mL Mouth Rinse BID   furosemide  80 mg Intravenous BID   insulin aspart  0-15 Units Subcutaneous TID WC   insulin aspart  0-5 Units Subcutaneous QHS   insulin detemir  5 Units Subcutaneous BID   mouth rinse  15 mL Mouth Rinse q12n4p   multivitamin with minerals  1 tablet Oral Daily   Continuous Infusions:  cefTRIAXone (ROCEPHIN)  IV 1 g (02/09/20 0618)     LOS: 6 days   Time Spent in minutes   45 minutes  Savi Lastinger D.O. on 02/09/2020 at 10:40 AM  Between 7am to 7pm - Please see pager noted on [email or web address]  After 7pm go to [email or web address]  And look for the night coverage person covering for me after hours  [facility] Office  [phone]

## 2020-02-09 NOTE — Care Management Important Message (Signed)
Important Message  Patient Details  Name: Lisa [name] MRN: [id number] Date of Birth: 06/27/1935   Medicare Important Message Given:  Yes     Shelda Altes 02/09/2020, 11:41 AM

## 2020-02-10 DIAGNOSIS — I5043 Acute on chronic combined systolic (congestive) and diastolic (congestive) heart failure: Secondary | ICD-10-CM | POA: Diagnosis not present

## 2020-02-10 DIAGNOSIS — J9621 Acute and chronic respiratory failure with hypoxia: Secondary | ICD-10-CM | POA: Diagnosis not present

## 2020-02-10 DIAGNOSIS — N179 Acute kidney failure, unspecified: Secondary | ICD-10-CM | POA: Diagnosis not present

## 2020-02-10 DIAGNOSIS — N1832 Chronic kidney disease, stage 3b: Secondary | ICD-10-CM | POA: Diagnosis not present

## 2020-02-10 LAB — BASIC METABOLIC PANEL
Anion gap: 14 (ref 5–15)
BUN: 99 mg/dL — ABNORMAL HIGH (ref 8–23)
CO2: 28 mmol/L (ref 22–32)
Calcium: 8.5 mg/dL — ABNORMAL LOW (ref 8.9–10.3)
Chloride: 98 mmol/L (ref 98–111)
Creatinine, Ser: 2.48 mg/dL — ABNORMAL HIGH (ref 0.44–1.00)
GFR, Estimated: 19 mL/min — ABNORMAL LOW (ref >60)
Glucose, Bld: 114 mg/dL — ABNORMAL HIGH (ref 70–99)
Potassium: 4.7 mmol/L (ref 3.5–5.1)
Sodium: 140 mmol/L (ref 135–145)

## 2020-02-10 LAB — GLUCOSE, CAPILLARY
Glucose-Capillary: 108 mg/dL — ABNORMAL HIGH (ref 70–99)
Glucose-Capillary: 204 mg/dL — ABNORMAL HIGH (ref 70–99)
Glucose-Capillary: 290 mg/dL — ABNORMAL HIGH (ref 70–99)
Glucose-Capillary: 206 mg/dL — ABNORMAL HIGH (ref 70–99)

## 2020-02-10 MED ORDER — SODIUM CHLORIDE 0.9 % IV SOLN
INTRAVENOUS | Status: DC | PRN
  Administered 2020-02-10: 1000 mL via INTRAVENOUS

## 2020-02-10 NOTE — Progress Notes (Signed)
Physical Therapy Treatment Patient Details Name: Lisa [name] MRN: [id number] DOB: Jun 12, 1935 Today's Date: 02/10/2020    History of Present Illness 85 y.o. female non-English-speaking past medical history significant for paroxysmal atrial fibrillation on Xarelto, CAD, ischemic dilated cardiomyopathy with an EF of 25% status post AICD chronic respiratory failure on 2 L of oxygen chronic kidney disease stage IV insulin-dependent diabetes mellitus type 2 coronary artery disease presents via EMS for lower extremity edema.  Chest x-ray was done that showed pulmonary edema.    PT Comments    Today's PT session continued to focus on mobility progression. Progress continues to be limited due to language barrier and pt unable to hear interpreter stratus Ipad. Did attempt to have interpreter write out the directions in 3rd person with pt attempting to read it, unclear if she was able too or not . Pt did respond by stating her "legs don't work" when interpreter wrote about standing with therapy. Pt was mostly fixated on [place] calling her son and getting her more pillows, which she perseverated on these throughout the session. Acute PT to continue during pt's hospital stay.   Follow Up Recommendations  SNF;Supervision/Assistance - 24 hour     Equipment Recommendations  Wheelchair (measurements PT);Wheelchair cushion (measurements PT);Hospital bed    Precautions / Restrictions Precautions Precautions: Fall Precaution Comments: monitor SpO2 Restrictions Weight Bearing Restrictions: No    Mobility  Bed Mobility Overal bed mobility: Needs Assistance Bed Mobility: Supine to Sit;Sit to Supine     Supine to sit: Mod assist;HOB elevated Sit to supine: +2 for physical assistance;Mod assist   General bed mobility comments: with HOB ~30 degrees mod assist for supine>sit with multimodal cues needed due to pt unable to hear interpreter pad, rail and pads under pt used to faciliate movements. at end of  session mod assist of 2 to return to supine in bed with HOB flat. pads under pt used to assist/faciliate movements with second person min assist for shoulders and assist to clear bed surface with LE's.  Transfers Overall transfer level: Needs assistance Equipment used: 2 person hand held assist Transfers: Sit to/from Stand;Lateral/Scoot Transfers Sit to Stand: Mod assist        Lateral/Scoot Transfers: Mod assist;+2 physical assistance General transfer comment: mod assist of 2 with bil knees blocked and pads under pt used to faciliate hip extension for standing. pt not accepting weight on legs or assisting, stating per interpreter Ipad "my legs don't work". Transitioned to lateral scooting along edge of bed x 3 reps with pads, mod assist of 2. pt assisting with moving feet after each scoot with multimodal cues.      Balance Overall balance assessment: Needs assistance Sitting-balance support: Feet supported;Bilateral upper extremity supported Sitting balance-Leahy Scale: Fair Sitting balance - Comments: pt able to hold herself upright at edge of bed while attempted to talk via interpreter Ipad with no balance loss. at times pt reaching out to fix her pillows or pull Ipad closer to her.             Cognition Arousal/Alertness: Awake/alert Behavior During Therapy: WFL for tasks assessed/performed Overall Cognitive Status: Difficult to assess              General Comments: Pt unable to follow commands through interpreter (likely impacted by [facility]). Pt did attempt to read direcions typed out by interpreter. Mostly responding to the direction that "my legs don't work, I cant stand up" and repeatedly asking for [place] to call her son and give  her more pillows.       Pertinent Vitals/Pain Pain Assessment: Faces Pain Location: did not verbalize pain Pain Descriptors / Indicators: Grimacing Pain Intervention(s): Limited activity within patient's tolerance;Monitored during  session;Repositioned     PT Goals (current goals can now be found in the care plan section) Acute Rehab PT Goals PT Goal Formulation: With patient Time For Goal Achievement: 02/19/20 Potential to Achieve Goals: Fair Progress towards PT goals: Progressing toward goals    Frequency    Min 2X/week      PT Plan Current plan remains appropriate    AM-PAC PT "6 Clicks" Mobility   Outcome Measure  Help needed turning from your back to your side while in a flat bed without using bedrails?: A Lot Help needed moving from lying on your back to sitting on the side of a flat bed without using bedrails?: Total Help needed moving to and from a bed to a chair (including a wheelchair)?: A Lot Help needed standing up from a chair using your arms (e.g., wheelchair or bedside chair)?: A Lot Help needed to walk in hospital room?: Total Help needed climbing 3-5 steps with a railing? : Total 6 Click Score: 9    End of Session Equipment Utilized During Treatment: Oxygen Activity Tolerance: Patient tolerated treatment well;No increased pain;Other (comment) (limited by language barrier.) Patient left: in bed;with call bell/phone within reach;with bed alarm set Nurse Communication: Mobility status;Other (comment) (pt wishes for [place] to call her son) PT Visit Diagnosis: Unsteadiness on feet (R26.81);Other abnormalities of gait and mobility (R26.89);Muscle weakness (generalized) (M62.81)     Time: 3532-9924 PT Time Calculation (min) (ACUTE ONLY): 23 min  Charges:  $Therapeutic Activity: 23-37 mins                    Willow Ora, PTA, [facility] Acute [employer] [phone] 02/10/20, 3:33 PM   Willow Ora 02/10/2020, 3:32 PM

## 2020-02-10 NOTE — TOC Progression Note (Signed)
Transition of Care [facility]) - Progression Note    Patient Details  Name: Lisa [name] MRN: [id number] Date of Birth: 1936-01-28  Transition of Care [facility]) CM/SW [place],  Phone Number: 02/10/2020, 9:23 AM  Clinical Narrative:     Attempted to call son to discuss SNF choice. Phone range once and then had message stating no voicemailbox set up.   Expected Discharge Plan: [place] Barriers to Discharge: Continued Medical Work up  Expected Discharge Plan and Services Expected Discharge Plan: [place] arrangements for the past 2 months: Single Family Home                                       Social Determinants of Health (SDOH) Interventions    Readmission Risk Interventions No flowsheet data found.

## 2020-02-10 NOTE — TOC Progression Note (Signed)
Transition of Care [facility]) - Progression Note    Patient Details  Name: Lisa [name] MRN: [id number] Date of Birth: 16-Jul-1935  Transition of Care [facility]) CM/SW [place], [place] Phone Number: 02/10/2020, 3:14 PM  Clinical Narrative:     CSW attempted to call son again. No answer; no voicemail box available.   Pt has no family in the room.   Expected Discharge Plan: [place] Barriers to Discharge: Continued Medical Work up  Expected Discharge Plan and Services Expected Discharge Plan: [place] arrangements for the past 2 months: Single Family Home                                       Social Determinants of Health (SDOH) Interventions    Readmission Risk Interventions No flowsheet data found.

## 2020-02-10 NOTE — Progress Notes (Signed)
PROGRESS NOTE    Lisa [name]  PFX:[id number] DOB: December 13, 1935 DOA: 02/03/2020 PCP: Colonel Bald, MD   Brief Narrative:  HPI on 02/03/20 by Dr. Marlane Hatcher Lisa [name] is a 85 y.o. female with medical history significant of paroxysmal A. fib on Xarelto, CAD, ischemic dilated cardiomyopathy status post AICD, chronic hypoxemic respiratory failure on 2 L home oxygen, history of cardiac arrest, pulmonary hypertension, CKD stage IV, insulin-dependent type 2 diabetes, hypertension, OSA on CPAP, carotid arterial disease presenting to the ED via EMS for evaluation of bilateral lower extremity edema.  No family available at this time.  [place] interpreter services used but patient is currently somnolent and not able to give any history.  She was recently admitted to [facility] for CHF exacerbation and echo revealed LVEF of 25 to 30%.  Interim history  Patient admitted with respiratory failure with acute on chronic CHF exacerbation and placed on IV Lasix.  CHF team was also consulted and has now signed off.  Palliative care consulted as well to discuss goals of care.  Patient also found to have possible left lower extremity cellulitis and placed on IV Rocephin. Assessment & Plan   Acute respiratory failure with hypoxia due to acute on chronic systolic CHF exacerbation -Echocardiogram on 01/27/2020 showed an EF of 25% -BNP continues to be elevated -Patient with volume overload on exam -Patient did require BiPAP during this admission -Heart failure team consulted and appreciated, recommended IV Lasix with palliative care/hospice recommendation.  Patient is not a candidate for advanced heart failure therapies nor she candidate for dialysis. -Continue to monitor intake and output, daily weights (doubt weights are accurate) -Currently on IV Lasix 80 mg twice daily -Patient continues to have good UOP, 2775cc over past 24hrs  Sepsis secondary to possible left lower  extremity cellulitis/ R arm IV infiltration -Patient was noted to have fever as well as leukocytosis on admission -Patient was started on IV Rocephin -Currently she is afebrile, leukocytosis is improved -Left lower extremity Doppler unremarkable for DVT -RUE appears to have an area where the IV infiltrated- treat symptomatically  Acute kidney injury on chronic kidney disease, stage IV -Improving -Baseline creatinine 1.4-1.80 -Creatinine peaked to 3.5, currently down to 2.48 today  Paroxysmal atrial fibrillation -Continue Eliquis, amiodarone and metoprolol  Elevated troponin -Suspect demand ischemia secondary to the above  Mild hyponatremia -Resolved, currently 140 -Continue to monitor closely  Essential hypertension -BP well controlled  Diabetes mellitus, type II -Hemoglobin A1c 7.1 -Continue Levemir, insulin sliding scale and CBG monitoring  Goals of care -As noted above, patient is not a candidate for advanced heart failure therapies or dialysis -Palliative care consulted and appreciated -Patient currently DNR -Plan for discharge to SNF with palliative to follow   DVT Prophylaxis Eliquis  Code Status: DNR  Family Communication: None at bedside  Disposition Plan:  Status is: Inpatient  Remains inpatient appropriate because:IV treatments appropriate due to intensity of illness or inability to take PO   Dispo: The patient is from: Home              Anticipated d/c is to: SNF              Anticipated d/c date is: 2 days              Patient currently is not medically stable to d/c.   Consultants Cardiology Palliative care  Procedures  Lower extremity Doppler  Antibiotics   Anti-infectives (From admission, onward)   Start  Dose/Rate Route Frequency Ordered Stop   02/03/20 0730  ceFAZolin (ANCEF) IVPB 1 g/50 mL premix  Status:  Discontinued        1 g 100 mL/hr over 30 Minutes Intravenous Every 8 hours 02/03/20 0716 02/03/20 0720   02/03/20 0515   cefTRIAXone (ROCEPHIN) 1 g in sodium chloride 0.9 % 100 mL IVPB        1 g 200 mL/hr over 30 Minutes Intravenous Every 24 hours 02/03/20 0500        Subjective:   Lisa [name] seen and examined today.  Patient does not wish to interact with the interpretor.   Objective:   Vitals:   02/10/20 0030 02/10/20 0400 02/10/20 0726 02/10/20 0800  BP: 104/66 (!) 148/64 (!) 147/57 (!) 123/47  Pulse: 63 63 64 65  Resp: 20 17 19 18   Temp: 97.8 F (36.6 C) 98 F (36.7 C) 97.8 F (36.6 C)   TempSrc: Oral Oral Oral   SpO2: 100% 100% 100% 100%  Weight: 76.7 kg     Height:        Intake/Output Summary (Last 24 hours) at 02/10/2020 0948 Last data filed at 02/10/2020 0846 Gross per 24 hour  Intake 1040 ml  Output 2775 ml  Net -1735 ml   Filed Weights   02/08/20 0009 02/09/20 0000 02/10/20 0030  Weight: 79.4 kg 78.1 kg 76.7 kg   Exam  General: Well developed, ill-appearing, NAD  HEENT: NCAT, mucous membranes moist.   Cardiovascular: S1 S2 auscultated, irregular.  Respiratory: Diminished breath sounds  Abdomen: Soft, nontender, nondistended, + bowel sounds  Extremities: warm dry without cyanosis clubbing.  RUE bruising.  LLE edema  Neuro: Awake and alert, although does not wish to speak with the interpretor. Nonfocal  Data Reviewed: I have personally reviewed following labs and imaging studies  CBC: Recent Labs  Lab 02/03/20 1539 02/04/20 0152  WBC 15.8* 12.2*  NEUTROABS 13.4* 9.7*  HGB 11.3* 10.8*  HCT 37.9 34.1*  MCV 91.1 90.9  PLT 117* 191*   Basic Metabolic Panel: Recent Labs  Lab 02/06/20 0355 02/07/20 0430 02/08/20 0201 02/09/20 0343 02/10/20 0157  NA 133* 134* 132* 136 140  K 5.0 5.0 4.0 4.1 4.7  CL 92* 97* 93* 96* 98  CO2 24 24 23 28 28   GLUCOSE 185* 154* 164* 126* 114*  BUN 102* 115* 123* 115* 99*  CREATININE 3.46* 3.50* 3.28* 2.99* 2.48*  CALCIUM 8.6* 8.5* 8.2* 8.3* 8.5*   GFR: Estimated Creatinine Clearance: 17.5 mL/min (A) (by C-G formula  based on SCr of 2.48 mg/dL (H)). Liver Function Tests: No results for input(s): AST, ALT, ALKPHOS, BILITOT, PROT, ALBUMIN in the last 168 hours. No results for input(s): LIPASE, AMYLASE in the last 168 hours. No results for input(s): AMMONIA in the last 168 hours. Coagulation Profile: No results for input(s): INR, PROTIME in the last 168 hours. Cardiac Enzymes: No results for input(s): CKTOTAL, CKMB, CKMBINDEX, TROPONINI in the last 168 hours. BNP (last 3 results) No results for input(s): PROBNP in the last 8760 hours. HbA1C: No results for input(s): HGBA1C in the last 72 hours. CBG: Recent Labs  Lab 02/09/20 0618 02/09/20 1231 02/09/20 1606 02/09/20 2123 02/10/20 0612  GLUCAP 185* 153* 179* 143* 108*   Lipid Profile: No results for input(s): CHOL, HDL, LDLCALC, TRIG, CHOLHDL, LDLDIRECT in the last 72 hours. Thyroid Function Tests: No results for input(s): TSH, T4TOTAL, FREET4, T3FREE, THYROIDAB in the last 72 hours. Anemia Panel: No results for input(s): VITAMINB12, FOLATE, FERRITIN,  TIBC, IRON, RETICCTPCT in the last 72 hours. Urine analysis:    Component Value Date/Time   COLORURINE YELLOW 02/03/2020 0645   APPEARANCEUR CLEAR 02/03/2020 0645   LABSPEC 1.009 02/03/2020 0645   PHURINE 5.0 02/03/2020 0645   GLUCOSEU NEGATIVE 02/03/2020 0645   HGBUR SMALL (A) 02/03/2020 0645   BILIRUBINUR NEGATIVE 02/03/2020 0645   KETONESUR NEGATIVE 02/03/2020 0645   PROTEINUR NEGATIVE 02/03/2020 0645   UROBILINOGEN 0.2 08/23/2014 1838   NITRITE NEGATIVE 02/03/2020 0645   LEUKOCYTESUR MODERATE (A) 02/03/2020 0645   Sepsis Labs: @LABRCNTIP (procalcitonin:4,lacticidven:4)  ) Recent Results (from the past 240 hour(s))  Resp Panel by RT-PCR (Flu A&B, Covid) Nasopharyngeal Swab     Status: None   Collection Time: 02/03/20  2:25 AM   Specimen: Nasopharyngeal Swab; Nasopharyngeal(NP) swabs in vial transport medium  Result Value Ref Range Status   SARS Coronavirus 2 by RT PCR NEGATIVE  NEGATIVE Final    Comment: (NOTE) SARS-CoV-2 target nucleic acids are NOT DETECTED.  The SARS-CoV-2 RNA is generally detectable in upper respiratory specimens during the acute phase of infection. The lowest concentration of SARS-CoV-2 viral copies this assay can detect is 138 copies/mL. A negative result does not preclude SARS-Cov-2 infection and should not be used as the sole basis for treatment or other patient management decisions. A negative result may occur with  improper specimen collection/handling, submission of specimen other than nasopharyngeal swab, presence of viral mutation(s) within the areas targeted by this assay, and inadequate number of viral copies(<138 copies/mL). A negative result must be combined with clinical observations, patient history, and epidemiological information. The expected result is Negative.  Fact Sheet for Patients:  EntrepreneurPulse.com.au  Fact Sheet for Healthcare Providers:  IncredibleEmployment.be  This test is no t yet approved or cleared by the [place] FDA and  has been authorized for detection and/or diagnosis of SARS-CoV-2 by FDA under an Emergency Use Authorization (EUA). This EUA will remain  in effect (meaning this test can be used) for the duration of the COVID-19 declaration under Section 564(b)(1) of the Act, 21 U.S.C.section 360bbb-3(b)(1), unless the authorization is terminated  or revoked sooner.       Influenza A by PCR NEGATIVE NEGATIVE Final   Influenza B by PCR NEGATIVE NEGATIVE Final    Comment: (NOTE) The Xpert Xpress SARS-CoV-2/FLU/RSV plus assay is intended as an aid in the diagnosis of influenza from Nasopharyngeal swab specimens and should not be used as a sole basis for treatment. Nasal washings and aspirates are unacceptable for Xpert Xpress SARS-CoV-2/FLU/RSV testing.  Fact Sheet for Patients: EntrepreneurPulse.com.au  Fact Sheet for Healthcare  Providers: IncredibleEmployment.be  This test is not yet approved or cleared by the [place] FDA and has been authorized for detection and/or diagnosis of SARS-CoV-2 by FDA under an Emergency Use Authorization (EUA). This EUA will remain in effect (meaning this test can be used) for the duration of the COVID-19 declaration under Section 564(b)(1) of the Act, 21 U.S.C. section 360bbb-3(b)(1), unless the authorization is terminated or revoked.  Performed at [facility], [place] [address] [place], [place] [address]       Radiology Studies: No results found.   Scheduled Meds: . (feeding supplement) PROSource Plus  30 mL Oral BID BM  . apixaban  2.5 mg Oral BID  . chlorhexidine  15 mL Mouth Rinse BID  . furosemide  80 mg Intravenous BID  . insulin aspart  0-15 Units Subcutaneous TID WC  . insulin aspart  0-5 Units Subcutaneous QHS  .  insulin detemir  5 Units Subcutaneous BID  . mouth rinse  15 mL Mouth Rinse q12n4p  . multivitamin with minerals  1 tablet Oral Daily   Continuous Infusions: . sodium chloride 1,000 mL (02/10/20 0533)  . cefTRIAXone (ROCEPHIN)  IV 1 g (02/10/20 0538)     LOS: 7 days   Time Spent in minutes   30 minutes  Zaki Gertsch D.O. on 02/10/2020 at 9:48 AM  Between 7am to 7pm - Please see pager noted on [email or web address]  After 7pm go to [email or web address]  And look for the night coverage person covering for me after hours  [facility] Office  [phone]

## 2020-02-11 DIAGNOSIS — I5043 Acute on chronic combined systolic (congestive) and diastolic (congestive) heart failure: Secondary | ICD-10-CM | POA: Diagnosis not present

## 2020-02-11 DIAGNOSIS — N1832 Chronic kidney disease, stage 3b: Secondary | ICD-10-CM | POA: Diagnosis not present

## 2020-02-11 DIAGNOSIS — J9621 Acute and chronic respiratory failure with hypoxia: Secondary | ICD-10-CM | POA: Diagnosis not present

## 2020-02-11 DIAGNOSIS — N179 Acute kidney failure, unspecified: Secondary | ICD-10-CM | POA: Diagnosis not present

## 2020-02-11 DIAGNOSIS — I5033 Acute on chronic diastolic (congestive) heart failure: Secondary | ICD-10-CM

## 2020-02-11 LAB — GLUCOSE, CAPILLARY
Glucose-Capillary: 167 mg/dL — ABNORMAL HIGH (ref 70–99)
Glucose-Capillary: 205 mg/dL — ABNORMAL HIGH (ref 70–99)
Glucose-Capillary: 218 mg/dL — ABNORMAL HIGH (ref 70–99)
Glucose-Capillary: 192 mg/dL — ABNORMAL HIGH (ref 70–99)
Glucose-Capillary: 174 mg/dL — ABNORMAL HIGH (ref 70–99)

## 2020-02-11 LAB — BASIC METABOLIC PANEL
Anion gap: 12 (ref 5–15)
BUN: 78 mg/dL — ABNORMAL HIGH (ref 8–23)
CO2: 39 mmol/L — ABNORMAL HIGH (ref 22–32)
Calcium: 8.3 mg/dL — ABNORMAL LOW (ref 8.9–10.3)
Chloride: 92 mmol/L — ABNORMAL LOW (ref 98–111)
Creatinine, Ser: 1.84 mg/dL — ABNORMAL HIGH (ref 0.44–1.00)
GFR, Estimated: 27 mL/min — ABNORMAL LOW (ref >60)
Glucose, Bld: 169 mg/dL — ABNORMAL HIGH (ref 70–99)
Potassium: 3.7 mmol/L (ref 3.5–5.1)
Sodium: 143 mmol/L (ref 135–145)

## 2020-02-11 NOTE — Progress Notes (Addendum)
Pt refused BiPAP for night.

## 2020-02-11 NOTE — Progress Notes (Signed)
PROGRESS NOTE    Lisa [name]  HUT:[id number] DOB: 1935-04-12 DOA: 02/03/2020 PCP: Colonel Bald, MD   Brief Narrative:  HPI on 02/03/20 by Dr. Marlane Hatcher Lisa [name] is a 85 y.o. female with medical history significant of paroxysmal A. fib on Xarelto, CAD, ischemic dilated cardiomyopathy status post AICD, chronic hypoxemic respiratory failure on 2 L home oxygen, history of cardiac arrest, pulmonary hypertension, CKD stage IV, insulin-dependent type 2 diabetes, hypertension, OSA on CPAP, carotid arterial disease presenting to the ED via EMS for evaluation of bilateral lower extremity edema.  No family available at this time.  [place] interpreter services used but patient is currently somnolent and not able to give any history.  She was recently admitted to [facility] for CHF exacerbation and echo revealed LVEF of 25 to 30%.  Interim history  Patient admitted with respiratory failure with acute on chronic CHF exacerbation and placed on IV Lasix.  CHF team was also consulted and has now signed off.  Palliative care consulted as well to discuss goals of care.  Patient also found to have possible left lower extremity cellulitis and placed on IV Rocephin. Assessment & Plan   Acute respiratory failure with hypoxia due to acute on chronic systolic CHF exacerbation -Echocardiogram on 01/27/2020 showed an EF of 25% -BNP continues to be elevated -Patient with volume overload on exam -Patient did require BiPAP during this admission -Heart failure team consulted and appreciated, recommended IV Lasix with palliative care/hospice recommendation.  Patient is not a candidate for advanced heart failure therapies nor she candidate for dialysis. -Continue to monitor intake and output, daily weights (doubt weights are accurate) -Currently on IV Lasix 80 mg twice daily -Patient continues to have good UOP, 3425cc over past 24hrs  Sepsis secondary to possible left lower  extremity cellulitis/ R arm IV infiltration -Patient was noted to have fever as well as leukocytosis on admission -Patient was started on IV Rocephin -Currently she is afebrile, leukocytosis is improved -Left lower extremity Doppler unremarkable for DVT -RUE appears to have an area where the IV infiltrated- treat symptomatically  Acute kidney injury on chronic kidney disease, stage IV -Improving -Baseline creatinine 1.4-1.80 -Creatinine peaked to 3.5, currently down to 1.84 today  Paroxysmal atrial fibrillation -Continue Eliquis, amiodarone and metoprolol  Elevated troponin -Suspect demand ischemia secondary to the above  Mild hyponatremia -Resolved, currently 143 -Continue to monitor closely  Essential hypertension -BP well controlled  Diabetes mellitus, type II -Hemoglobin A1c 7.1 -Continue Levemir, insulin sliding scale and CBG monitoring  Goals of care -As noted above, patient is not a candidate for advanced heart failure therapies or dialysis -Palliative care consulted and appreciated -Patient currently DNR -Plan for discharge to SNF with palliative to follow   DVT Prophylaxis Eliquis  Code Status: DNR  Family Communication: None at bedside  Disposition Plan:  Status is: Inpatient  Remains inpatient appropriate because:IV treatments appropriate due to intensity of illness or inability to take PO   Dispo: The patient is from: Home              Anticipated d/c is to: SNF              Anticipated d/c date is: 2 days              Patient currently is not medically stable to d/c.   Consultants Cardiology Palliative care  Procedures  Lower extremity Doppler  Antibiotics   Anti-infectives (From admission, onward)   Start  Dose/Rate Route Frequency Ordered Stop   02/03/20 0730  ceFAZolin (ANCEF) IVPB 1 g/50 mL premix  Status:  Discontinued        1 g 100 mL/hr over 30 Minutes Intravenous Every 8 hours 02/03/20 0716 02/03/20 0720   02/03/20 0515   cefTRIAXone (ROCEPHIN) 1 g in sodium chloride 0.9 % 100 mL IVPB        1 g 200 mL/hr over 30 Minutes Intravenous Every 24 hours 02/03/20 0500        Subjective:   Lisa [name] seen and examined today.  Used an Astronomer.  Patient states that she wants to be left alone and wants to get up and go home.  Could not obtain any further information.  Objective:   Vitals:   02/10/20 1634 02/10/20 2243 02/11/20 0605 02/11/20 0851  BP: (!) 133/52 (!) 136/50 (!) 104/59 (!) 112/57  Pulse: 65 63 86 89  Resp: 19 16 18    Temp: 97.9 F (36.6 C) 97.8 F (36.6 C) 98.6 F (37 C) 98.6 F (37 C)  TempSrc: Oral Oral Oral Oral  SpO2: 100% 100% 100% 100%  Weight:   73.3 kg   Height:        Intake/Output Summary (Last 24 hours) at 02/11/2020 1059 Last data filed at 02/11/2020 0612 Gross per 24 hour  Intake 480 ml  Output 3425 ml  Net -2945 ml   Filed Weights   02/09/20 0000 02/10/20 0030 02/11/20 0605  Weight: 78.1 kg 76.7 kg 73.3 kg   Exam  General: Well developed, chronically ill-appearing, NAD  HEENT: NCAT, mucous membranes moist.   Cardiovascular: S1 S2 auscultated, irregular.  Respiratory: Diminished breath sounds  Abdomen: Soft, nontender, nondistended, + bowel sounds  Extremities: warm dry without cyanosis clubbing.  RUE bruising.  LLE edema  Neuro: Awake and alert however patient does not answer all questions through the use of an interpreter.  Currently nonfocal  Data Reviewed: I have personally reviewed following labs and imaging studies  CBC: No results for input(s): WBC, NEUTROABS, HGB, HCT, MCV, PLT in the last 168 hours. Basic Metabolic Panel: Recent Labs  Lab 02/07/20 0430 02/08/20 0201 02/09/20 0343 02/10/20 0157 02/11/20 0429  NA 134* 132* 136 140 143  K 5.0 4.0 4.1 4.7 3.7  CL 97* 93* 96* 98 92*  CO2 24 23 28 28  39*  GLUCOSE 154* 164* 126* 114* 169*  BUN 115* 123* 115* 99* 78*  CREATININE 3.50* 3.28* 2.99* 2.48* 1.84*  CALCIUM 8.5* 8.2* 8.3* 8.5*  8.3*   GFR: Estimated Creatinine Clearance: 23.1 mL/min (A) (by C-G formula based on SCr of 1.84 mg/dL (H)). Liver Function Tests: No results for input(s): AST, ALT, ALKPHOS, BILITOT, PROT, ALBUMIN in the last 168 hours. No results for input(s): LIPASE, AMYLASE in the last 168 hours. No results for input(s): AMMONIA in the last 168 hours. Coagulation Profile: No results for input(s): INR, PROTIME in the last 168 hours. Cardiac Enzymes: No results for input(s): CKTOTAL, CKMB, CKMBINDEX, TROPONINI in the last 168 hours. BNP (last 3 results) No results for input(s): PROBNP in the last 8760 hours. HbA1C: No results for input(s): HGBA1C in the last 72 hours. CBG: Recent Labs  Lab 02/10/20 1133 02/10/20 1615 02/10/20 2239 02/11/20 0559 02/11/20 0845  GLUCAP 204* 290* 206* 167* 205*   Lipid Profile: No results for input(s): CHOL, HDL, LDLCALC, TRIG, CHOLHDL, LDLDIRECT in the last 72 hours. Thyroid Function Tests: No results for input(s): TSH, T4TOTAL, FREET4, T3FREE, THYROIDAB in the last 72  hours. Anemia Panel: No results for input(s): VITAMINB12, FOLATE, FERRITIN, TIBC, IRON, RETICCTPCT in the last 72 hours. Urine analysis:    Component Value Date/Time   COLORURINE YELLOW 02/03/2020 0645   APPEARANCEUR CLEAR 02/03/2020 0645   LABSPEC 1.009 02/03/2020 0645   PHURINE 5.0 02/03/2020 0645   GLUCOSEU NEGATIVE 02/03/2020 0645   HGBUR SMALL (A) 02/03/2020 0645   BILIRUBINUR NEGATIVE 02/03/2020 0645   KETONESUR NEGATIVE 02/03/2020 0645   PROTEINUR NEGATIVE 02/03/2020 0645   UROBILINOGEN 0.2 08/23/2014 1838   NITRITE NEGATIVE 02/03/2020 0645   LEUKOCYTESUR MODERATE (A) 02/03/2020 0645   Sepsis Labs: @LABRCNTIP (procalcitonin:4,lacticidven:4)  ) Recent Results (from the past 240 hour(s))  Resp Panel by RT-PCR (Flu A&B, Covid) Nasopharyngeal Swab     Status: None   Collection Time: 02/03/20  2:25 AM   Specimen: Nasopharyngeal Swab; Nasopharyngeal(NP) swabs in vial transport  medium  Result Value Ref Range Status   SARS Coronavirus 2 by RT PCR NEGATIVE NEGATIVE Final    Comment: (NOTE) SARS-CoV-2 target nucleic acids are NOT DETECTED.  The SARS-CoV-2 RNA is generally detectable in upper respiratory specimens during the acute phase of infection. The lowest concentration of SARS-CoV-2 viral copies this assay can detect is 138 copies/mL. A negative result does not preclude SARS-Cov-2 infection and should not be used as the sole basis for treatment or other patient management decisions. A negative result may occur with  improper specimen collection/handling, submission of specimen other than nasopharyngeal swab, presence of viral mutation(s) within the areas targeted by this assay, and inadequate number of viral copies(<138 copies/mL). A negative result must be combined with clinical observations, patient history, and epidemiological information. The expected result is Negative.  Fact Sheet for Patients:  EntrepreneurPulse.com.au  Fact Sheet for Healthcare Providers:  IncredibleEmployment.be  This test is no t yet approved or cleared by the [place] FDA and  has been authorized for detection and/or diagnosis of SARS-CoV-2 by FDA under an Emergency Use Authorization (EUA). This EUA will remain  in effect (meaning this test can be used) for the duration of the COVID-19 declaration under Section 564(b)(1) of the Act, 21 U.S.C.section 360bbb-3(b)(1), unless the authorization is terminated  or revoked sooner.       Influenza A by PCR NEGATIVE NEGATIVE Final   Influenza B by PCR NEGATIVE NEGATIVE Final    Comment: (NOTE) The Xpert Xpress SARS-CoV-2/FLU/RSV plus assay is intended as an aid in the diagnosis of influenza from Nasopharyngeal swab specimens and should not be used as a sole basis for treatment. Nasal washings and aspirates are unacceptable for Xpert Xpress SARS-CoV-2/FLU/RSV testing.  Fact Sheet for  Patients: EntrepreneurPulse.com.au  Fact Sheet for Healthcare Providers: IncredibleEmployment.be  This test is not yet approved or cleared by the [place] FDA and has been authorized for detection and/or diagnosis of SARS-CoV-2 by FDA under an Emergency Use Authorization (EUA). This EUA will remain in effect (meaning this test can be used) for the duration of the COVID-19 declaration under Section 564(b)(1) of the Act, 21 U.S.C. section 360bbb-3(b)(1), unless the authorization is terminated or revoked.  Performed at [facility], [place] [address] [place], [place] [address]       Radiology Studies: No results found.   Scheduled Meds: . (feeding supplement) PROSource Plus  30 mL Oral BID BM  . apixaban  2.5 mg Oral BID  . chlorhexidine  15 mL Mouth Rinse BID  . furosemide  80 mg Intravenous BID  . insulin aspart  0-15 Units Subcutaneous TID  WC  . insulin aspart  0-5 Units Subcutaneous QHS  . insulin detemir  5 Units Subcutaneous BID  . mouth rinse  15 mL Mouth Rinse q12n4p  . multivitamin with minerals  1 tablet Oral Daily   Continuous Infusions: . sodium chloride 1,000 mL (02/10/20 0533)  . cefTRIAXone (ROCEPHIN)  IV 1 g (02/11/20 0612)     LOS: 8 days   Time Spent in minutes   30 minutes  Wayden Schwertner D.O. on 02/11/2020 at 10:59 AM  Between 7am to 7pm - Please see pager noted on [email or web address]  After 7pm go to [email or web address]  And look for the night coverage person covering for me after hours  [facility] Office  [phone]

## 2020-02-11 NOTE — TOC Progression Note (Addendum)
Transition of Care [facility]) - Progression Note    Patient Details  Name: Lisa [name] MRN: [id number] Date of Birth: 07/10/1935  Transition of Care [facility]) CM/SW [place], [place] Phone Number: [phone] 02/11/2020, 9:36 AM  Clinical Narrative:     CSW spoke with patient's son in regards to bed offer. Son explained that he would be at the hospitall at 12pm and could CSW come by the room. CSW stated that she would come by the room to discuss the bed offers.  CSW attempted to meet with son however he left patient's room already. CSW attempted to call son however did not reach him and could not leave VM.  TOC team will continue to assist with discharge planning needs.   Expected Discharge Plan: [place] Barriers to Discharge: Continued Medical Work up  Expected Discharge Plan and Services Expected Discharge Plan: [place] arrangements for the past 2 months: Single Family Home                                       Social Determinants of Health (SDOH) Interventions    Readmission Risk Interventions No flowsheet data found.

## 2020-02-12 DIAGNOSIS — J9621 Acute and chronic respiratory failure with hypoxia: Secondary | ICD-10-CM | POA: Diagnosis not present

## 2020-02-12 DIAGNOSIS — N179 Acute kidney failure, unspecified: Secondary | ICD-10-CM | POA: Diagnosis not present

## 2020-02-12 DIAGNOSIS — N1832 Chronic kidney disease, stage 3b: Secondary | ICD-10-CM | POA: Diagnosis not present

## 2020-02-12 DIAGNOSIS — I5043 Acute on chronic combined systolic (congestive) and diastolic (congestive) heart failure: Secondary | ICD-10-CM | POA: Diagnosis not present

## 2020-02-12 LAB — BASIC METABOLIC PANEL
Anion gap: 12 (ref 5–15)
BUN: 71 mg/dL — ABNORMAL HIGH (ref 8–23)
CO2: 39 mmol/L — ABNORMAL HIGH (ref 22–32)
Calcium: 8.8 mg/dL — ABNORMAL LOW (ref 8.9–10.3)
Chloride: 91 mmol/L — ABNORMAL LOW (ref 98–111)
Creatinine, Ser: 1.64 mg/dL — ABNORMAL HIGH (ref 0.44–1.00)
GFR, Estimated: 31 mL/min — ABNORMAL LOW (ref >60)
Glucose, Bld: 172 mg/dL — ABNORMAL HIGH (ref 70–99)
Potassium: 3.7 mmol/L (ref 3.5–5.1)
Sodium: 142 mmol/L (ref 135–145)

## 2020-02-12 LAB — GLUCOSE, CAPILLARY
Glucose-Capillary: 132 mg/dL — ABNORMAL HIGH (ref 70–99)
Glucose-Capillary: 187 mg/dL — ABNORMAL HIGH (ref 70–99)
Glucose-Capillary: 173 mg/dL — ABNORMAL HIGH (ref 70–99)
Glucose-Capillary: 226 mg/dL — ABNORMAL HIGH (ref 70–99)

## 2020-02-12 LAB — HEMOGLOBIN AND HEMATOCRIT, BLOOD
HCT: 40.5 % (ref 36.0–46.0)
Hemoglobin: 11.9 g/dL — ABNORMAL LOW (ref 12.0–15.0)

## 2020-02-12 MED ORDER — FUROSEMIDE 10 MG/ML IJ SOLN
40.0000 mg | Freq: Two times a day (BID) | INTRAMUSCULAR | Status: DC
  Administered 2020-02-12 – 2020-02-14 (×4): 40 mg via INTRAVENOUS
  Filled 2020-02-12 (×4): qty 4

## 2020-02-12 MED ORDER — POLYETHYLENE GLYCOL 3350 17 G PO PACK
17.0000 g | PACK | Freq: Every day | ORAL | Status: DC
  Administered 2020-02-12 – 2020-02-14 (×3): 17 g via ORAL
  Filled 2020-02-12 (×4): qty 1

## 2020-02-12 NOTE — Progress Notes (Addendum)
PROGRESS NOTE    Lisa [name]  JEH:[id number] DOB: 04/27/1935 DOA: 02/03/2020 PCP: Colonel Bald, MD   Brief Narrative:  HPI on 02/03/20 by Dr. Marlane Hatcher Kawai is a 85 y.o. female with medical history significant of paroxysmal A. fib on Xarelto, CAD, ischemic dilated cardiomyopathy status post AICD, chronic hypoxemic respiratory failure on 2 L home oxygen, history of cardiac arrest, pulmonary hypertension, CKD stage IV, insulin-dependent type 2 diabetes, hypertension, OSA on CPAP, carotid arterial disease presenting to the ED via EMS for evaluation of bilateral lower extremity edema.  No family available at this time.  [place] interpreter services used but patient is currently somnolent and not able to give any history.  She was recently admitted to [facility] for CHF exacerbation and echo revealed LVEF of 25 to 30%.  Interim history  Patient admitted with respiratory failure with acute on chronic CHF exacerbation and placed on IV Lasix.  CHF team was also consulted and has now signed off.  Palliative care consulted as well to discuss goals of care.  Patient also found to have possible left lower extremity cellulitis and placed on IV Rocephin. Assessment & Plan   Acute respiratory failure with hypoxia due to acute on chronic systolic CHF exacerbation -Echocardiogram on 01/27/2020 showed an EF of 25% -BNP continues to be elevated -Patient with volume overload on exam -Patient did require BiPAP during this admission -Heart failure team consulted and appreciated, recommended IV Lasix with palliative care/hospice recommendation.  Patient is not a candidate for advanced heart failure therapies nor she candidate for dialysis. -Continue to monitor intake and output, daily weights (doubt weights are accurate) -Currently on IV Lasix 80 mg twice daily- will reduce to 40mg  BID -Patient continues to have good UOP, 2300cc over past 24hrs  Sepsis secondary to  possible left lower extremity cellulitis/ R arm IV infiltration -Patient was noted to have fever as well as leukocytosis on admission -Patient was started on IV Rocephin -Currently she is afebrile, leukocytosis is improved -Left lower extremity Doppler unremarkable for DVT -RUE appears to have an area where the IV infiltrated- treat symptomatically  Acute kidney injury on chronic kidney disease, stage IV -Improving -Baseline creatinine 1.4-1.80 -Creatinine peaked to 3.5, currently down to 1.64 today  Paroxysmal atrial fibrillation -Continue Eliquis, amiodarone and metoprolol  Elevated troponin -Suspect demand ischemia secondary to the above  Mild hyponatremia -Resolved, currently 142 -Continue to monitor closely  Essential hypertension -BP well controlled  Diabetes mellitus, type II -Hemoglobin A1c 7.1 -Continue Levemir, insulin sliding scale and CBG monitoring  Goals of care -As noted above, patient is not a candidate for advanced heart failure therapies or dialysis -Palliative care consulted and appreciated -Patient currently DNR -Plan for discharge to SNF with palliative to follow   DVT Prophylaxis Eliquis  Code Status: DNR  Family Communication: None at bedside  Disposition Plan:  Status is: Inpatient  Remains inpatient appropriate because:IV treatments appropriate due to intensity of illness or inability to take PO   Dispo: The patient is from: Home              Anticipated d/c is to: SNF              Anticipated d/c date is: 2 days              Patient currently is not medically stable to d/c.   Consultants Cardiology Palliative care  Procedures  Lower extremity Doppler  Antibiotics   Anti-infectives (From admission, onward)  Start     Dose/Rate Route Frequency Ordered Stop   02/03/20 0730  ceFAZolin (ANCEF) IVPB 1 g/50 mL premix  Status:  Discontinued        1 g 100 mL/hr over 30 Minutes Intravenous Every 8 hours 02/03/20 0716 02/03/20 0720    02/03/20 0515  cefTRIAXone (ROCEPHIN) 1 g in sodium chloride 0.9 % 100 mL IVPB        1 g 200 mL/hr over 30 Minutes Intravenous Every 24 hours 02/03/20 0500 02/13/20 0514      Subjective:   Lisa [name] seen and examined today.  With use of interpreter.  Patient wants to go home and would not answer any further questions.    Objective:   Vitals:   02/11/20 1700 02/11/20 2000 02/12/20 0636 02/12/20 0715  BP: (!) 144/62 (!) 174/71 122/64 (!) 142/52  Pulse: 64   66  Resp: 19 (!) 21 17 (!) 22  Temp: 98 F (36.7 C) 97.8 F (36.6 C) 98 F (36.7 C) 98 F (36.7 C)  TempSrc: Oral Oral Oral Oral  SpO2: 100% 100% 96% 96%  Weight:   73.2 kg   Height:        Intake/Output Summary (Last 24 hours) at 02/12/2020 1119 Last data filed at 02/12/2020 0900 Gross per 24 hour  Intake 540 ml  Output 1700 ml  Net -1160 ml   Filed Weights   02/10/20 0030 02/11/20 0605 02/12/20 0636  Weight: 76.7 kg 73.3 kg 73.2 kg   Exam  General: Well developed, chronically ill-appearing, NAD  HEENT: NCAT, mucous membranes moist.   Cardiovascular: S1 S2 auscultated, irregular.  Respiratory: Diminished breath sounds  Abdomen: Soft, nontender, nondistended, + bowel sounds  Extremities: warm dry without cyanosis clubbing.  RUE bruising and erythema.  LLE edema  Neuro: Awake and alert however cannot answer all my questions.  Nonfocal  Data Reviewed: I have personally reviewed following labs and imaging studies  CBC: Recent Labs  Lab 02/12/20 0239  HGB 11.9*  HCT 61.6   Basic Metabolic Panel: Recent Labs  Lab 02/08/20 0201 02/09/20 0343 02/10/20 0157 02/11/20 0429 02/12/20 0239  NA 132* 136 140 143 142  K 4.0 4.1 4.7 3.7 3.7  CL 93* 96* 98 92* 91*  CO2 23 28 28  39* 39*  GLUCOSE 164* 126* 114* 169* 172*  BUN 123* 115* 99* 78* 71*  CREATININE 3.28* 2.99* 2.48* 1.84* 1.64*  CALCIUM 8.2* 8.3* 8.5* 8.3* 8.8*   GFR: Estimated Creatinine Clearance: 25.9 mL/min (A) (by C-G formula based on  SCr of 1.64 mg/dL (H)). Liver Function Tests: No results for input(s): AST, ALT, ALKPHOS, BILITOT, PROT, ALBUMIN in the last 168 hours. No results for input(s): LIPASE, AMYLASE in the last 168 hours. No results for input(s): AMMONIA in the last 168 hours. Coagulation Profile: No results for input(s): INR, PROTIME in the last 168 hours. Cardiac Enzymes: No results for input(s): CKTOTAL, CKMB, CKMBINDEX, TROPONINI in the last 168 hours. BNP (last 3 results) No results for input(s): PROBNP in the last 8760 hours. HbA1C: No results for input(s): HGBA1C in the last 72 hours. CBG: Recent Labs  Lab 02/11/20 0845 02/11/20 1120 02/11/20 1639 02/11/20 2147 02/12/20 0635  GLUCAP 205* 218* 192* 174* 132*   Lipid Profile: No results for input(s): CHOL, HDL, LDLCALC, TRIG, CHOLHDL, LDLDIRECT in the last 72 hours. Thyroid Function Tests: No results for input(s): TSH, T4TOTAL, FREET4, T3FREE, THYROIDAB in the last 72 hours. Anemia Panel: No results for input(s): VITAMINB12, FOLATE, FERRITIN,  TIBC, IRON, RETICCTPCT in the last 72 hours. Urine analysis:    Component Value Date/Time   COLORURINE YELLOW 02/03/2020 0645   APPEARANCEUR CLEAR 02/03/2020 0645   LABSPEC 1.009 02/03/2020 0645   PHURINE 5.0 02/03/2020 0645   GLUCOSEU NEGATIVE 02/03/2020 0645   HGBUR SMALL (A) 02/03/2020 0645   BILIRUBINUR NEGATIVE 02/03/2020 0645   KETONESUR NEGATIVE 02/03/2020 0645   PROTEINUR NEGATIVE 02/03/2020 0645   UROBILINOGEN 0.2 08/23/2014 1838   NITRITE NEGATIVE 02/03/2020 0645   LEUKOCYTESUR MODERATE (A) 02/03/2020 0645   Sepsis Labs: @LABRCNTIP (procalcitonin:4,lacticidven:4)  ) Recent Results (from the past 240 hour(s))  Resp Panel by RT-PCR (Flu A&B, Covid) Nasopharyngeal Swab     Status: None   Collection Time: 02/03/20  2:25 AM   Specimen: Nasopharyngeal Swab; Nasopharyngeal(NP) swabs in vial transport medium  Result Value Ref Range Status   SARS Coronavirus 2 by RT PCR NEGATIVE NEGATIVE  Final    Comment: (NOTE) SARS-CoV-2 target nucleic acids are NOT DETECTED.  The SARS-CoV-2 RNA is generally detectable in upper respiratory specimens during the acute phase of infection. The lowest concentration of SARS-CoV-2 viral copies this assay can detect is 138 copies/mL. A negative result does not preclude SARS-Cov-2 infection and should not be used as the sole basis for treatment or other patient management decisions. A negative result may occur with  improper specimen collection/handling, submission of specimen other than nasopharyngeal swab, presence of viral mutation(s) within the areas targeted by this assay, and inadequate number of viral copies(<138 copies/mL). A negative result must be combined with clinical observations, patient history, and epidemiological information. The expected result is Negative.  Fact Sheet for Patients:  EntrepreneurPulse.com.au  Fact Sheet for Healthcare Providers:  IncredibleEmployment.be  This test is no t yet approved or cleared by the [place] FDA and  has been authorized for detection and/or diagnosis of SARS-CoV-2 by FDA under an Emergency Use Authorization (EUA). This EUA will remain  in effect (meaning this test can be used) for the duration of the COVID-19 declaration under Section 564(b)(1) of the Act, 21 U.S.C.section 360bbb-3(b)(1), unless the authorization is terminated  or revoked sooner.       Influenza A by PCR NEGATIVE NEGATIVE Final   Influenza B by PCR NEGATIVE NEGATIVE Final    Comment: (NOTE) The Xpert Xpress SARS-CoV-2/FLU/RSV plus assay is intended as an aid in the diagnosis of influenza from Nasopharyngeal swab specimens and should not be used as a sole basis for treatment. Nasal washings and aspirates are unacceptable for Xpert Xpress SARS-CoV-2/FLU/RSV testing.  Fact Sheet for Patients: EntrepreneurPulse.com.au  Fact Sheet for Healthcare  Providers: IncredibleEmployment.be  This test is not yet approved or cleared by the [place] FDA and has been authorized for detection and/or diagnosis of SARS-CoV-2 by FDA under an Emergency Use Authorization (EUA). This EUA will remain in effect (meaning this test can be used) for the duration of the COVID-19 declaration under Section 564(b)(1) of the Act, 21 U.S.C. section 360bbb-3(b)(1), unless the authorization is terminated or revoked.  Performed at [facility], [place] [address] [place], [place] [address]       Radiology Studies: No results found.   Scheduled Meds: . (feeding supplement) PROSource Plus  30 mL Oral BID BM  . apixaban  2.5 mg Oral BID  . chlorhexidine  15 mL Mouth Rinse BID  . furosemide  80 mg Intravenous BID  . insulin aspart  0-15 Units Subcutaneous TID WC  . insulin aspart  0-5 Units Subcutaneous QHS  .  insulin detemir  5 Units Subcutaneous BID  . mouth rinse  15 mL Mouth Rinse q12n4p  . multivitamin with minerals  1 tablet Oral Daily   Continuous Infusions: . sodium chloride 1,000 mL (02/10/20 0533)  . cefTRIAXone (ROCEPHIN)  IV 1 g (02/11/20 0612)     LOS: 9 days   Time Spent in minutes   30 minutes  Ember Gottwald D.O. on 02/12/2020 at 11:19 AM  Between 7am to 7pm - Please see pager noted on [email or web address]  After 7pm go to [email or web address]  And look for the night coverage person covering for me after hours  [facility] Office  [phone]

## 2020-02-12 NOTE — Progress Notes (Signed)
PT refused CPAP/BiPAP tonight.

## 2020-02-12 NOTE — TOC Progression Note (Signed)
Transition of Care [facility]) - Progression Note    Patient Details  Name: Lisa [name] MRN: [id number] Date of Birth: [birth year]  Transition of Care [facility]) CM/SW [place], LCSW Phone Number: [phone] 02/12/2020, 10:06 AM  Clinical Narrative:     CSW attempted to call patient's son Diho again however was unable to reach him and cannot leave VM.  TOC team will continue to assist with discharge planning needs.    Expected Discharge Plan: [place] Barriers to Discharge: Continued Medical Work up  Expected Discharge Plan and Services Expected Discharge Plan: [place] arrangements for the past 2 months: Single Family Home                                       Social Determinants of Health (SDOH) Interventions    Readmission Risk Interventions No flowsheet data found.

## 2020-02-13 DIAGNOSIS — N179 Acute kidney failure, unspecified: Secondary | ICD-10-CM | POA: Diagnosis not present

## 2020-02-13 DIAGNOSIS — N1832 Chronic kidney disease, stage 3b: Secondary | ICD-10-CM | POA: Diagnosis not present

## 2020-02-13 DIAGNOSIS — J9621 Acute and chronic respiratory failure with hypoxia: Secondary | ICD-10-CM | POA: Diagnosis not present

## 2020-02-13 DIAGNOSIS — I5043 Acute on chronic combined systolic (congestive) and diastolic (congestive) heart failure: Secondary | ICD-10-CM | POA: Diagnosis not present

## 2020-02-13 LAB — GLUCOSE, CAPILLARY
Glucose-Capillary: 248 mg/dL — ABNORMAL HIGH (ref 70–99)
Glucose-Capillary: 165 mg/dL — ABNORMAL HIGH (ref 70–99)
Glucose-Capillary: 183 mg/dL — ABNORMAL HIGH (ref 70–99)
Glucose-Capillary: 128 mg/dL — ABNORMAL HIGH (ref 70–99)
Glucose-Capillary: 118 mg/dL — ABNORMAL HIGH (ref 70–99)

## 2020-02-13 LAB — BASIC METABOLIC PANEL
Anion gap: 12 (ref 5–15)
BUN: 55 mg/dL — ABNORMAL HIGH (ref 8–23)
CO2: 41 mmol/L — ABNORMAL HIGH (ref 22–32)
Calcium: 8.9 mg/dL (ref 8.9–10.3)
Chloride: 88 mmol/L — ABNORMAL LOW (ref 98–111)
Creatinine, Ser: 1.46 mg/dL — ABNORMAL HIGH (ref 0.44–1.00)
GFR, Estimated: 36 mL/min — ABNORMAL LOW (ref >60)
Glucose, Bld: 223 mg/dL — ABNORMAL HIGH (ref 70–99)
Potassium: 3.8 mmol/L (ref 3.5–5.1)
Sodium: 141 mmol/L (ref 135–145)

## 2020-02-13 NOTE — Progress Notes (Signed)
PT refused CPAP for the night.

## 2020-02-13 NOTE — Plan of Care (Signed)
  Problem: Education: Goal: Knowledge of General Education information will improve Description Including pain rating scale, medication(s)/side effects and non-pharmacologic comfort measures Outcome: Progressing   

## 2020-02-13 NOTE — Progress Notes (Addendum)
Attempted to contact son; no answer no voicemailbox available.   CSW called pt granddaughter and left message  requesting return call

## 2020-02-13 NOTE — Progress Notes (Signed)
PROGRESS NOTE    Lisa [name]  FTD:[id number] DOB: [birth year] DOA: 02/03/2020 PCP: Colonel Bald, MD   Brief Narrative:  HPI on 02/03/20 by Dr. Marlane Hatcher Rohlfs is a 85 y.o. female with medical history significant of paroxysmal A. fib on Xarelto, CAD, ischemic dilated cardiomyopathy status post AICD, chronic hypoxemic respiratory failure on 2 L home oxygen, history of cardiac arrest, pulmonary hypertension, CKD stage IV, insulin-dependent type 2 diabetes, hypertension, OSA on CPAP, carotid arterial disease presenting to the ED via EMS for evaluation of bilateral lower extremity edema.  No family available at this time.  [place] interpreter services used but patient is currently somnolent and not able to give any history.  She was recently admitted to [facility] for CHF exacerbation and echo revealed LVEF of 25 to 30%.  Interim history  Patient admitted with respiratory failure with acute on chronic CHF exacerbation and placed on IV Lasix.  CHF team was also consulted and has now signed off.  Palliative care consulted as well to discuss goals of care.  Patient also found to have possible left lower extremity cellulitis and placed on IV Rocephin and completed course. Assessment & Plan   Acute respiratory failure with hypoxia due to acute on chronic systolic CHF exacerbation -Echocardiogram on 01/27/2020 showed an EF of 25% -BNP continues to be elevated -Patient with volume overload on exam -Patient did require BiPAP during this admission -Heart failure team consulted and appreciated, recommended IV Lasix with palliative care/hospice recommendation.  Patient is not a candidate for advanced heart failure therapies nor she candidate for dialysis. -Continue to monitor intake and output, daily weights (doubt weights are accurate) -Initially placed on IV Lasix 80 mg twice daily, then transition to 40 mg twice daily.  Will transition patient to oral regimen  and continue to monitor to obtain appropriate dosage -Patient continues to have good UOP, 1100cc over past 24hrs  Sepsis secondary to possible left lower extremity cellulitis/ R arm IV infiltration -Patient was noted to have fever as well as leukocytosis on admission -Patient was started on IV Rocephin- Completed course -Currently she is afebrile, leukocytosis is improved -Left lower extremity Doppler unremarkable for DVT -RUE appears to have an area where the IV infiltrated- treat symptomatically  Acute kidney injury on chronic kidney disease, stage IV -Resolved -Baseline creatinine 1.4-1.80 -Creatinine peaked to 3.5, currently down to 1.46 today  Paroxysmal atrial fibrillation -Continue Eliquis, amiodarone and metoprolol  Elevated troponin -Suspect demand ischemia secondary to the above  Mild hyponatremia -Resolved, currently 141 -Continue to monitor closely  Essential hypertension -BP well controlled  Diabetes mellitus, type II -Hemoglobin A1c 7.1 -Continue Levemir, insulin sliding scale and CBG monitoring  Goals of care -As noted above, patient is not a candidate for advanced heart failure therapies or dialysis -Palliative care consulted and appreciated -Patient currently DNR -Plan for discharge to SNF with palliative to follow   DVT Prophylaxis Eliquis  Code Status: DNR  Family Communication: None at bedside  Disposition Plan:  Status is: Inpatient  Remains inpatient appropriate because:IV treatments appropriate due to intensity of illness or inability to take PO   Dispo: The patient is from: Home              Anticipated d/c is to: SNF              Anticipated d/c date is: 2 days              Patient currently is not medically  stable to d/c.   Consultants Cardiology Palliative care  Procedures  Lower extremity Doppler  Antibiotics   Anti-infectives (From admission, onward)   Start     Dose/Rate Route Frequency Ordered Stop   02/03/20 0730   ceFAZolin (ANCEF) IVPB 1 g/50 mL premix  Status:  Discontinued        1 g 100 mL/hr over 30 Minutes Intravenous Every 8 hours 02/03/20 0716 02/03/20 0720   02/03/20 0515  cefTRIAXone (ROCEPHIN) 1 g in sodium chloride 0.9 % 100 mL IVPB        1 g 200 mL/hr over 30 Minutes Intravenous Every 24 hours 02/03/20 0500 02/13/20 0514      Subjective:   Lisa [name] seen and examined today.  Used interpreter.  Cannot obtain much information from patient at this point.  Patient states she wants to go home.    Objective:   Vitals:   02/12/20 1155 02/12/20 1755 02/12/20 2022 02/13/20 0440  BP: (!) 152/53 (!) 143/49 123/73 (!) 105/59  Pulse: 66 66 67 93  Resp: 16 17 18 20   Temp: 98.1 F (36.7 C) 98.2 F (36.8 C) 97.7 F (36.5 C) 97.8 F (36.6 C)  TempSrc: Oral Oral Oral Oral  SpO2: 96% 96% 100% 100%  Weight:    71.3 kg  Height:        Intake/Output Summary (Last 24 hours) at 02/13/2020 1215 Last data filed at 02/13/2020 0441 Gross per 24 hour  Intake 590.89 ml  Output 1101 ml  Net -510.11 ml   Filed Weights   02/11/20 0605 02/12/20 0636 02/13/20 0440  Weight: 73.3 kg 73.2 kg 71.3 kg   Exam  General: Well developed, chronically ill-appearing, NAD  HEENT: NCAT, mucous membranes moist.   Cardiovascular: S1 S2 auscultated, irregular.  Respiratory: Diminished breath sounds  Abdomen: Soft, nontender, nondistended, + bowel sounds  Extremities: warm dry without cyanosis clubbing.  RUE bruising and erythema- improving. LE edema imrpoving  Neuro: Awake and alert however refusing to answer most of my questions.  Nonfocal  Data Reviewed: I have personally reviewed following labs and imaging studies  CBC: Recent Labs  Lab 02/12/20 0239  HGB 11.9*  HCT 80.9   Basic Metabolic Panel: Recent Labs  Lab 02/09/20 0343 02/10/20 0157 02/11/20 0429 02/12/20 0239 02/13/20 0310  NA 136 140 143 142 141  K 4.1 4.7 3.7 3.7 3.8  CL 96* 98 92* 91* 88*  CO2 28 28 39* 39* 41*   GLUCOSE 126* 114* 169* 172* 223*  BUN 115* 99* 78* 71* 55*  CREATININE 2.99* 2.48* 1.84* 1.64* 1.46*  CALCIUM 8.3* 8.5* 8.3* 8.8* 8.9   GFR: Estimated Creatinine Clearance: 28.7 mL/min (A) (by C-G formula based on SCr of 1.46 mg/dL (H)). Liver Function Tests: No results for input(s): AST, ALT, ALKPHOS, BILITOT, PROT, ALBUMIN in the last 168 hours. No results for input(s): LIPASE, AMYLASE in the last 168 hours. No results for input(s): AMMONIA in the last 168 hours. Coagulation Profile: No results for input(s): INR, PROTIME in the last 168 hours. Cardiac Enzymes: No results for input(s): CKTOTAL, CKMB, CKMBINDEX, TROPONINI in the last 168 hours. BNP (last 3 results) No results for input(s): PROBNP in the last 8760 hours. HbA1C: No results for input(s): HGBA1C in the last 72 hours. CBG: Recent Labs  Lab 02/12/20 1241 02/12/20 1652 02/12/20 2218 02/13/20 0602 02/13/20 1140  GLUCAP 187* 173* 226* 248* 165*   Lipid Profile: No results for input(s): CHOL, HDL, LDLCALC, TRIG, CHOLHDL, LDLDIRECT in the  last 72 hours. Thyroid Function Tests: No results for input(s): TSH, T4TOTAL, FREET4, T3FREE, THYROIDAB in the last 72 hours. Anemia Panel: No results for input(s): VITAMINB12, FOLATE, FERRITIN, TIBC, IRON, RETICCTPCT in the last 72 hours. Urine analysis:    Component Value Date/Time   COLORURINE YELLOW 02/03/2020 0645   APPEARANCEUR CLEAR 02/03/2020 0645   LABSPEC 1.009 02/03/2020 0645   PHURINE 5.0 02/03/2020 0645   GLUCOSEU NEGATIVE 02/03/2020 0645   HGBUR SMALL (A) 02/03/2020 0645   BILIRUBINUR NEGATIVE 02/03/2020 0645   KETONESUR NEGATIVE 02/03/2020 0645   PROTEINUR NEGATIVE 02/03/2020 0645   UROBILINOGEN 0.2 08/23/2014 1838   NITRITE NEGATIVE 02/03/2020 0645   LEUKOCYTESUR MODERATE (A) 02/03/2020 0645   Sepsis Labs: @LABRCNTIP (procalcitonin:4,lacticidven:4)  ) No results found for this or any previous visit (from the past 240 hour(s)).    Radiology Studies: No  results found.   Scheduled Meds: . (feeding supplement) PROSource Plus  30 mL Oral BID BM  . apixaban  2.5 mg Oral BID  . chlorhexidine  15 mL Mouth Rinse BID  . furosemide  40 mg Intravenous BID  . insulin aspart  0-15 Units Subcutaneous TID WC  . insulin aspart  0-5 Units Subcutaneous QHS  . insulin detemir  5 Units Subcutaneous BID  . mouth rinse  15 mL Mouth Rinse q12n4p  . multivitamin with minerals  1 tablet Oral Daily  . polyethylene glycol  17 g Oral Daily   Continuous Infusions: . sodium chloride 1,000 mL (02/10/20 0533)     LOS: 10 days   Time Spent in minutes   30 minutes  Woodrow Dulski D.O. on 02/13/2020 at 12:15 PM  Between 7am to 7pm - Please see pager noted on [email or web address]  After 7pm go to [email or web address]  And look for the night coverage person covering for me after hours  [facility] Office  [phone]

## 2020-02-13 NOTE — Care Management Important Message (Signed)
Important Message  Patient Details  Name: [name] MRN: [id number] Date of Birth: 09/25/1935   Medicare Important Message Given:  Yes     Shelda Altes 02/13/2020, 12:00 PM

## 2020-02-14 DIAGNOSIS — N179 Acute kidney failure, unspecified: Secondary | ICD-10-CM | POA: Diagnosis not present

## 2020-02-14 DIAGNOSIS — I5043 Acute on chronic combined systolic (congestive) and diastolic (congestive) heart failure: Secondary | ICD-10-CM | POA: Diagnosis not present

## 2020-02-14 DIAGNOSIS — J9621 Acute and chronic respiratory failure with hypoxia: Secondary | ICD-10-CM | POA: Diagnosis not present

## 2020-02-14 DIAGNOSIS — N1832 Chronic kidney disease, stage 3b: Secondary | ICD-10-CM | POA: Diagnosis not present

## 2020-02-14 LAB — BASIC METABOLIC PANEL
Anion gap: 13 (ref 5–15)
BUN: 49 mg/dL — ABNORMAL HIGH (ref 8–23)
CO2: 42 mmol/L — ABNORMAL HIGH (ref 22–32)
Calcium: 9.2 mg/dL (ref 8.9–10.3)
Chloride: 84 mmol/L — ABNORMAL LOW (ref 98–111)
Creatinine, Ser: 1.38 mg/dL — ABNORMAL HIGH (ref 0.44–1.00)
GFR, Estimated: 38 mL/min — ABNORMAL LOW (ref >60)
Glucose, Bld: 115 mg/dL — ABNORMAL HIGH (ref 70–99)
Potassium: 3.6 mmol/L (ref 3.5–5.1)
Sodium: 139 mmol/L (ref 135–145)

## 2020-02-14 LAB — HEMOGLOBIN AND HEMATOCRIT, BLOOD
HCT: 39.4 % (ref 36.0–46.0)
Hemoglobin: 12.1 g/dL (ref 12.0–15.0)

## 2020-02-14 LAB — GLUCOSE, CAPILLARY
Glucose-Capillary: 94 mg/dL (ref 70–99)
Glucose-Capillary: 105 mg/dL — ABNORMAL HIGH (ref 70–99)
Glucose-Capillary: 123 mg/dL — ABNORMAL HIGH (ref 70–99)

## 2020-02-14 MED ORDER — TORSEMIDE 20 MG PO TABS
20.0000 mg | ORAL_TABLET | Freq: Every morning | ORAL | Status: DC
  Administered 2020-02-14: 20 mg via ORAL
  Filled 2020-02-14: qty 1

## 2020-02-14 MED ORDER — INSULIN ASPART 100 UNIT/ML ~~LOC~~ SOLN: 0.0000 [IU] | Freq: Three times a day (TID) | SUBCUTANEOUS | Status: DC

## 2020-02-14 NOTE — Progress Notes (Signed)
CSW attempted to call pt Son Diho 2x. No answer and no voicemail box available. CSW sent text to number requesting call back to discuss SNF decision.   CSW called pt grandaughter Suzanna and left voicemail requesting return call to get in touch with family.   CSW called phone number listed as pt's mobile; A man answered and CSW inquired if it was "Mr. Coller" to which the individual responded yes. CSW then asked if he was pt's husband to which he said "no you have the wrong number." CSW then asked who individual was but he hung up.   No family is currently bedside. CSW is informed by Network engineer that pt's husband normally comes in late afternoon.
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
## 2020-02-14 NOTE — Progress Notes (Signed)
[facility])  Referral received for hospice services at home.  Spoke with son and NOK, confirmed interest, answered questions and provided support.  He states he will be taking him mother home tonight.  No immediate DME needs.  If comfort meds are anticipated, please arrange for these so there is no lapse in her comfort as hospice will not be at the home until Wednesday or Thursday.  Venia Carbon RN, BSN, [facility] Liaison

## 2020-02-14 NOTE — Progress Notes (Signed)
Nutrition Follow-up  DOCUMENTATION CODES:   Obesity unspecified  INTERVENTION:   Continue:  -Prosource Plus 30 mL BID, each supplement provides 100 kcals and 14 grams of protein  -MVI with minerals daily  NUTRITION DIAGNOSIS:   Inadequate oral intake related to lethargy/confusion,decreased appetite as evidenced by meal completion < 50%.  Ongoing   GOAL:   Patient will meet greater than or equal to 90% of their needs  Ongoing   MONITOR:   PO intake,Supplement acceptance,Labs,Weight trends,Skin,I & O's  REASON FOR ASSESSMENT:   Low [place]    ASSESSMENT:   Lisa [name] is a 85 y.o. female with medical history significant of paroxysmal A. fib on Xarelto, CAD, ischemic dilated cardiomyopathy status post AICD, chronic hypoxemic respiratory failure on 2 L home oxygen, history of cardiac arrest, pulmonary hypertension, CKD stage IV, insulin-dependent type 2 diabetes, hypertension, OSA on CPAP, carotid arterial disease presenting to the ED via EMS for evaluation of bilateral lower extremity edema.  No family available at this time.  [place] interpreter services used but patient is currently somnolent and not able to give any history.  She was recently admitted to [facility] for CHF exacerbation and echo revealed LVEF of 25 to 30%.  Pt admitted with CHF. Pt was noted to have sepsis secondary to LLE cellulitis. Completed IV Rocephin, transitioned to PO. Pt is not a candidate for heart failure therapies or dialysis. Advanced Heart Failure team continuing to recommend palliative care/hospice.   Pt sleeping at time of visit. No response to voice. Intake continues to be poor with 0-50% of documented meal completions.    Per chart review, pt taking 30 mL PROSource Plus BID. Discussed with RN who reports pt will take this but unwillingly. RN reports mixing with water for better acceptance. RN reports pt intake of meals is very poor.   Per TOC note, pt's son is at bedside  and pt is to d/c home today with hospice services.   Labs reviewed: CBG's x 24 hours: 94-183, Chloride: 84   Medications reviewed and include: Eliquis, Lasix, Novolog SSI, 5 units Levemir MVI with minerals, Miralax, Demadex.    Diet Order:   Diet Order            Diet heart healthy/carb modified Room service appropriate? Yes; Fluid consistency: Thin  Diet effective now                 EDUCATION NEEDS:   No education needs have been identified at this time  Skin:  Skin Assessment: Skin Integrity Issues: Skin Integrity Issues:: Stage I Stage I: sacrum  Last BM:  Unknown  Height:   Ht Readings from Last 1 Encounters:  02/04/20 5\' 4"  (1.626 m)    Weight:   Wt Readings from Last 1 Encounters:  02/14/20 71.4 kg    Ideal Body Weight:  54.5 kg  BMI:  Body mass index is 27.02 kg/m.  Estimated Nutritional Needs:   Kcal:  2035-5974  Protein:  85-100 grams  Fluid:  > 1.7 L   Ronnald Nian, Dietetic Intern Pager: [phone] If unavailable: [phone]

## 2020-02-14 NOTE — Progress Notes (Signed)
PROGRESS NOTE    Lisa [name]  KPT:[id number] DOB: 12/10/1935 DOA: 02/03/2020 PCP: Colonel Bald, MD   Brief Narrative:  HPI on 02/03/20 by Dr. Marlane Hatcher Faidley is a 85 y.o. female with medical history significant of paroxysmal A. fib on Xarelto, CAD, ischemic dilated cardiomyopathy status post AICD, chronic hypoxemic respiratory failure on 2 L home oxygen, history of cardiac arrest, pulmonary hypertension, CKD stage IV, insulin-dependent type 2 diabetes, hypertension, OSA on CPAP, carotid arterial disease presenting to the ED via EMS for evaluation of bilateral lower extremity edema.  No family available at this time.  [place] interpreter services used but patient is currently somnolent and not able to give any history.  She was recently admitted to [facility] for CHF exacerbation and echo revealed LVEF of 25 to 30%.  Interim history  Patient admitted with respiratory failure with acute on chronic CHF exacerbation and placed on IV Lasix.  CHF team was also consulted and has now signed off.  Palliative care consulted as well to discuss goals of care.  Patient also found to have possible left lower extremity cellulitis and placed on IV Rocephin and completed course. Transitioned to PO regimen today. Pending SNF placement.  Assessment & Plan   Acute respiratory failure with hypoxia due to acute on chronic systolic CHF exacerbation -Echocardiogram on 01/27/2020 showed an EF of 25% -BNP continues to be elevated -Patient with volume overload on exam -Patient did require BiPAP during this admission -Heart failure team consulted and appreciated, recommended IV Lasix with palliative care/hospice recommendation.  Patient is not a candidate for advanced heart failure therapies nor she candidate for dialysis. -Continue to monitor intake and output, daily weights (doubt weights are accurate) -Initially placed on IV Lasix 80 mg twice daily, then transition to 40  mg twice daily.   -Patient continues to have good UOP, 1100cc over past 24hrs -Will transition to oral torsemide today and continue to monitor  Sepsis secondary to possible left lower extremity cellulitis/ R arm IV infiltration -Patient was noted to have fever as well as leukocytosis on admission -Patient was started on IV Rocephin- Completed course -Currently she is afebrile, leukocytosis is improved -Left lower extremity Doppler unremarkable for DVT -RUE appears to have an area where the IV infiltrated- treat symptomatically  Acute kidney injury on chronic kidney disease, stage IV -Resolved -Baseline creatinine 1.4-1.80 -Creatinine peaked to 3.5, currently down to 1.38 today  Paroxysmal atrial fibrillation -Continue Eliquis, amiodarone and metoprolol  Elevated troponin -Suspect demand ischemia secondary to the above  Mild hyponatremia -Resolved, currently 139 -Continue to monitor closely  Essential hypertension -BP well controlled  Diabetes mellitus, type II -Hemoglobin A1c 7.1 -Continue Levemir, insulin sliding scale and CBG monitoring  Goals of care -As noted above, patient is not a candidate for advanced heart failure therapies or dialysis -Palliative care consulted and appreciated -Patient currently DNR -Plan for discharge to SNF with palliative to follow   DVT Prophylaxis Eliquis  Code Status: DNR  Family Communication: None at bedside.  Have tried to call the son several times with no answer and no voicemail could be left.  Disposition Plan:  Status is: Inpatient  Remains inpatient appropriate because:IV treatments appropriate due to intensity of illness or inability to take PO   Dispo: The patient is from: Home              Anticipated d/c is to: SNF  Anticipated d/c date is: 2 days              Patient currently is not medically stable to d/c.   Consultants Cardiology Palliative care  Procedures  Lower extremity  Doppler  Antibiotics   Anti-infectives (From admission, onward)   Start     Dose/Rate Route Frequency Ordered Stop   02/03/20 0730  ceFAZolin (ANCEF) IVPB 1 g/50 mL premix  Status:  Discontinued        1 g 100 mL/hr over 30 Minutes Intravenous Every 8 hours 02/03/20 0716 02/03/20 0720   02/03/20 0515  cefTRIAXone (ROCEPHIN) 1 g in sodium chloride 0.9 % 100 mL IVPB        1 g 200 mL/hr over 30 Minutes Intravenous Every 24 hours 02/03/20 0500 02/13/20 0514      Subjective:   Lisa [name] seen and examined today.  Using interpreter complaining of her mouth hurting.  Also wants to go home.  Denies chest pain or headache at this time.  Not wanting to answer all questions.    Objective:   Vitals:   02/14/20 0446 02/14/20 0449 02/14/20 0800 02/14/20 1104  BP: 137/67  (!) 153/57   Pulse: 70  64   Resp: 20  20   Temp: 97.7 F (36.5 C)  97.9 F (36.6 C) 98 F (36.7 C)  TempSrc: Oral  Oral Oral  SpO2: 100%  100%   Weight:  71.4 kg    Height:        Intake/Output Summary (Last 24 hours) at 02/14/2020 1219 Last data filed at 02/14/2020 1000 Gross per 24 hour  Intake 360 ml  Output 1700 ml  Net -1340 ml   Filed Weights   02/12/20 0636 02/13/20 0440 02/14/20 0449  Weight: 73.2 kg 71.3 kg 71.4 kg   Exam  General: Well developed, chronically ill-appearing, NAD  HEENT: NCAT, mucous membranes dry   Cardiovascular: S1 S2 auscultated, irregular  Respiratory: Diminished breath sounds  Abdomen: Soft, nontender, nondistended, + bowel sounds  Extremities: warm dry without cyanosis clubbing.  Lower extremity edema improving  Neuro: Awake and alert however refusing to answer most questions  Skin: Is in place on lower extremities.  Noted to have bruising and ecchymosis on right upper extremity which is also improving  Psych: cannot fully assess  Data Reviewed: I have personally reviewed following labs and imaging studies  CBC: Recent Labs  Lab 02/12/20 0239 02/14/20 0330   HGB 11.9* 12.1  HCT 40.5 44.0   Basic Metabolic Panel: Recent Labs  Lab 02/10/20 0157 02/11/20 0429 02/12/20 0239 02/13/20 0310 02/14/20 0330  NA 140 143 142 141 139  K 4.7 3.7 3.7 3.8 3.6  CL 98 92* 91* 88* 84*  CO2 28 39* 39* 41* 42*  GLUCOSE 114* 169* 172* 223* 115*  BUN 99* 78* 71* 55* 49*  CREATININE 2.48* 1.84* 1.64* 1.46* 1.38*  CALCIUM 8.5* 8.3* 8.8* 8.9 9.2   GFR: Estimated Creatinine Clearance: 30.5 mL/min (A) (by C-G formula based on SCr of 1.38 mg/dL (H)). Liver Function Tests: No results for input(s): AST, ALT, ALKPHOS, BILITOT, PROT, ALBUMIN in the last 168 hours. No results for input(s): LIPASE, AMYLASE in the last 168 hours. No results for input(s): AMMONIA in the last 168 hours. Coagulation Profile: No results for input(s): INR, PROTIME in the last 168 hours. Cardiac Enzymes: No results for input(s): CKTOTAL, CKMB, CKMBINDEX, TROPONINI in the last 168 hours. BNP (last 3 results) No results for input(s): PROBNP in the  last 8760 hours. HbA1C: No results for input(s): HGBA1C in the last 72 hours. CBG: Recent Labs  Lab 02/13/20 1642 02/13/20 2110 02/13/20 2155 02/14/20 0603 02/14/20 1107  GLUCAP 183* 128* 118* 94 105*   Lipid Profile: No results for input(s): CHOL, HDL, LDLCALC, TRIG, CHOLHDL, LDLDIRECT in the last 72 hours. Thyroid Function Tests: No results for input(s): TSH, T4TOTAL, FREET4, T3FREE, THYROIDAB in the last 72 hours. Anemia Panel: No results for input(s): VITAMINB12, FOLATE, FERRITIN, TIBC, IRON, RETICCTPCT in the last 72 hours. Urine analysis:    Component Value Date/Time   COLORURINE YELLOW 02/03/2020 0645   APPEARANCEUR CLEAR 02/03/2020 0645   LABSPEC 1.009 02/03/2020 0645   PHURINE 5.0 02/03/2020 0645   GLUCOSEU NEGATIVE 02/03/2020 0645   HGBUR SMALL (A) 02/03/2020 0645   BILIRUBINUR NEGATIVE 02/03/2020 0645   KETONESUR NEGATIVE 02/03/2020 0645   PROTEINUR NEGATIVE 02/03/2020 0645   UROBILINOGEN 0.2 08/23/2014 1838    NITRITE NEGATIVE 02/03/2020 0645   LEUKOCYTESUR MODERATE (A) 02/03/2020 0645   Sepsis Labs: @LABRCNTIP (procalcitonin:4,lacticidven:4)  ) No results found for this or any previous visit (from the past 240 hour(s)).    Radiology Studies: No results found.   Scheduled Meds: . (feeding supplement) PROSource Plus  30 mL Oral BID BM  . apixaban  2.5 mg Oral BID  . chlorhexidine  15 mL Mouth Rinse BID  . furosemide  40 mg Intravenous BID  . insulin aspart  0-15 Units Subcutaneous TID WC  . insulin aspart  0-5 Units Subcutaneous QHS  . insulin detemir  5 Units Subcutaneous BID  . mouth rinse  15 mL Mouth Rinse q12n4p  . multivitamin with minerals  1 tablet Oral Daily  . polyethylene glycol  17 g Oral Daily   Continuous Infusions: . sodium chloride 1,000 mL (02/10/20 0533)     LOS: 11 days   Time Spent in minutes   30 minutes  Ashtan Laton D.O. on 02/14/2020 at 12:19 PM  Between 7am to 7pm - Please see pager noted on [email or web address]  After 7pm go to [email or web address]  And look for the night coverage person covering for me after hours  [facility] Office  [phone]

## 2020-02-14 NOTE — Progress Notes (Signed)
PT Cancellation Note  Patient Details Name: Lisa [name] MRN: [id number] DOB: August 11, 1935   Cancelled Treatment:    Reason Eval/Treat Not Completed: Other (comment). Pt going home with hospice.    Shary Decamp [facility] 02/14/2020, 4:49 PM [place] Pager [phone] Office [phone]

## 2020-02-14 NOTE — Plan of Care (Signed)

## 2020-02-14 NOTE — Progress Notes (Addendum)
D/C instructions given and reviewed with son at bedside. Questions asked and answered. Tele and IV removed. Small skin tear noted under tegaderm. Covered with dry gauze and tape.

## 2020-02-14 NOTE — TOC Transition Note (Signed)
Transition of Care [facility]) - CM/SW Discharge Note   Patient Details  Name: Lisa [name] MRN: [id number] Date of Birth: Jul 16, 1935  Transition of Care [facility]) CM/SW Contact:  Bethann Berkshire, [place] Phone Number: 02/14/2020, 4:06 PM   Clinical Narrative:     CSW spoke with pt son bedside. CSW provides SNF bed offers. Son explains he does not feel she is able to do well in rehab in her condition. He requests that she can come home with hospice. Son states that his primary care doctor has been recommending hospice for the past month. He does not have preference for hospice company.   CSW explained to son that equipment would likely not be able to be delivered until following day. Son still wants to drive her home today. He has O2 tank in his vehicle.   CSW notified MD. CSW made referral to [place]. Pt to d/c home with hospice today. Son will transport.    Barriers to Discharge: No Barriers Identified   Patient Goals and CMS Choice Patient states their goals for this hospitalization and ongoing recovery are:: Son wants pt to return home with Hospice      Discharge Placement                       Discharge Plan and Services                                     Social Determinants of Health (SDOH) Interventions     Readmission Risk Interventions No flowsheet data found.

## 2020-02-14 NOTE — Discharge Summary (Signed)
Physician Discharge Summary  Lisa [name] BJS:[id number] DOB: Jun 29, 1935 DOA: 02/03/2020  PCP: Colonel Bald, MD  Admit date: 02/03/2020 Discharge date: 02/14/2020  Time spent: 45 minutes  Recommendations for Outpatient Follow-up:  Patient will be discharged to home with hospice.  Patient may follow up with primary care provider within one week of discharge.  Patient should continue medications as prescribed.  Patient should follow a heart healthy/carb modified diet.    Discharge Diagnoses:  Acute respiratory failure with hypoxia due to acute on chronic systolic CHF exacerbation Sepsis secondary to possible left lower extremity cellulitis/ R arm IV infiltration Acute kidney injury on chronic kidney disease, stage IV Paroxysmal atrial fibrillation Elevated troponin Mild hyponatremia Essential hypertension Diabetes mellitus, type II Goals of care   Discharge Condition: Stable  Diet recommendation: heart healthy/carb modified  Filed Weights   02/12/20 0636 02/13/20 0440 02/14/20 0449  Weight: 73.2 kg 71.3 kg 71.4 kg    History of present illness:  on 02/03/20 by Dr. Marlane Hatcher Davidovicis a 85 y.o.femalewith medical history significant ofparoxysmal A. fib on Xarelto, CAD, ischemic dilated cardiomyopathy status post AICD, chronic hypoxemic respiratory failure on 2 L home oxygen, history of cardiac arrest, pulmonary hypertension, CKD stage IV, insulin-dependent type 2 diabetes, hypertension, OSA on CPAP, carotid arterial disease presenting to the ED via EMS for evaluation of bilateral lower extremity edema. No familyavailable at this time. [place] interpreter services used but patient is currently somnolent and not able to give any history. [facility]was [facility]F [facility] and echo revealed LVEF of 25 to 30%.  Hospital Course:  Acute respiratory failure with hypoxia due to acute on chronic systolic CHF  exacerbation -Echocardiogram on 01/27/2020 showed an EF of 25% -BNP continues to be elevated -Patient with volume overload on exam -Patient did require BiPAP during this admission -Heart failure team consulted and appreciated, recommended IV Lasix with palliative care/hospice recommendation.  Patient is not a candidate for advanced heart failure therapies nor she candidate for dialysis. -Continue to monitor intake and output, daily weights (doubt weights are accurate) -Initially placed on IV Lasix 80 mg twice daily, then transition to 40 mg twice daily.   -Patient continues to have good UOP, 1100cc over past 24hrs -Transitioned to torsemide  Sepsis secondary to possible left lower extremity cellulitis/ R arm IV infiltration -Patient was noted to have fever as well as leukocytosis on admission -Patient was started on IV Rocephin- Completed course -Currently she is afebrile, leukocytosis is improved -Left lower extremity Doppler unremarkable for DVT -RUE appears to have an area where the IV infiltrated- treat symptomatically  Acute kidney injury on chronic kidney disease, stage IV -Resolved -Baseline creatinine 1.4-1.80 -Creatinine peaked to 3.5, currently down to 1.38 today  Paroxysmal atrial fibrillation -Continue Eliquis, amiodarone and metoprolol  Elevated troponin -Suspect demand ischemia secondary to the above  Mild hyponatremia -Resolved, currently 139 -Continue to monitor closely  Essential hypertension -BP well controlled  Diabetes mellitus, type II -Hemoglobin A1c 7.1 -Continue Levemir, insulin sliding scale and CBG monitoring  Goals of care -As noted above, patient is not a candidate for advanced heart failure therapies or dialysis -Palliative care consulted and appreciated -Patient currently DNR -Son would now like to take the patient home with hospice; he will need DME but those items can be delivered later  Consultants Cardiology Palliative  care  Procedures  Lower extremity Doppler   Discharge Exam: Vitals:   02/14/20 1104 02/14/20 1547  BP:  Pulse:    Resp:    Temp: 98 F (36.7 C) 98 F (36.7 C)  SpO2:      Exam  General: Well developed, chronically ill-appearing, NAD  HEENT: NCAT, mucous membranes dry   Cardiovascular: S1 S2 auscultated, irregular  Respiratory: Diminished breath sounds  Abdomen: Soft, nontender, nondistended, + bowel sounds  Extremities: warm dry without cyanosis clubbing.  Lower extremity edema improving  Neuro: Awake and alert however refusing to answer most questions  Skin: Is in place on lower extremities.  Noted to have bruising and ecchymosis on right upper extremity which is also improving  Psych: cannot fully assess   Discharge Instructions Discharge Instructions    Diet - low sodium heart healthy   Complete by: As directed    Increase activity slowly   Complete by: As directed    No wound care   Complete by: As directed      Allergies as of 02/14/2020      Reactions   Lisinopril Cough      Medication List    STOP taking these medications   amiodarone 200 MG tablet Commonly known as: PACERONE   aspirin EC 81 MG tablet   butalbital-acetaminophen-caffeine 50-325-40 MG tablet Commonly known as: FIORICET   carvedilol 12.5 MG tablet Commonly known as: COREG   docusate sodium 100 MG capsule Commonly known as: COLACE   furosemide 40 MG tablet Commonly known as: LASIX   hydrALAZINE 25 MG tablet Commonly known as: APRESOLINE   Rivaroxaban 15 MG Tabs tablet Commonly known as: XARELTO   rosuvastatin 10 MG tablet Commonly known as: CRESTOR     TAKE these medications   Accu-Chek Guide test strip Generic drug: glucose blood 1 each by Other route 4 (four) times daily. Use as instructed   acetaminophen 500 MG tablet Commonly known as: TYLENOL Take 500 mg by mouth every 6 (six) hours as needed for mild pain or headache.   albuterol (2.5 MG/3ML)  0.083% nebulizer solution Commonly known as: PROVENTIL Inhale 3 mLs into the lungs every 6 (six) hours as needed for wheezing or shortness of breath.   atorvastatin 20 MG tablet Commonly known as: LIPITOR Take 20 mg by mouth daily.   Eliquis 2.5 MG Tabs tablet Generic drug: apixaban Take 2.5 mg by mouth 2 (two) times daily.   FeroSul 325 (65 FE) MG tablet Generic drug: ferrous sulfate Take 325 mg by mouth every morning.   Insulin Pen Needle 32G X 4 MM Misc Commonly known as: BD Pen Needle Nano U/F Four times daily   isosorbide mononitrate 60 MG 24 hr tablet Commonly known as: IMDUR Take 60 mg by mouth daily.   magnesium oxide 400 (241.3 Mg) MG tablet Commonly known as: MAG-OX Take 400 mg by mouth daily.   metoprolol succinate 25 MG 24 hr tablet Commonly known as: TOPROL-XL Take 25 mg by mouth daily.   NovoLOG FlexPen 100 UNIT/ML FlexPen Generic drug: insulin aspart Inject 12 Units into the skin 3 (three) times daily with meals. Pt uses per sliding scale. What changed:   how much to take  additional instructions   omeprazole 40 MG capsule Commonly known as: PRILOSEC Take 40 mg by mouth daily.   OXYGEN Inhale 2 L into the lungs continuous.   OXYGEN Inhale 5 % into the lungs at bedtime. Cpap   polyethylene glycol 17 g packet Commonly known as: MIRALAX / GLYCOLAX Take 17 g by mouth daily.   senna-docusate 8.6-50 MG tablet Commonly known as: Senokot-S  Take 2 tablets by mouth daily as needed for mild constipation.   spironolactone 25 MG tablet Commonly known as: ALDACTONE Take 25 mg by mouth daily.   torsemide 20 MG tablet Commonly known as: DEMADEX Take 20 mg by mouth every morning.   Tyler Aas FlexTouch 100 UNIT/ML FlexTouch Pen Generic drug: insulin degludec Inject 0.3 mLs (30 Units total) into the skin at bedtime. What changed: how much to take      Allergies  Allergen Reactions  . Lisinopril Cough      The results of significant  diagnostics from this hospitalization (including imaging, microbiology, ancillary and laboratory) are listed below for reference.    Significant Diagnostic Studies: DG Chest Portable 1 View  Result Date: 02/03/2020 CLINICAL DATA:  Leg swelling EXAM: PORTABLE CHEST 1 VIEW COMPARISON:  January 28, 2020 FINDINGS: The heart size and mediastinal contours are unchanged with cardiomegaly. A left-sided pacemaker again noted. There is diffusely increased interstitial markings throughout both lungs. No definite pleural effusion. No acute osseous abnormality. IMPRESSION: Findings which may be suggestive of interstitial edema. Electronically Signed   By: Prudencio Pair M.D.   On: 02/03/2020 02:49   VAS [place] LOWER EXTREMITY VENOUS (DVT)  Result Date: 02/04/2020  Lower Venous DVT Study Indications: Swelling, and Pain.  Limitations: Patient started kicking when calf was touched assumed to be the rash and pain as a cause. Patient does not speak english. Comparison Study: No previous exam Performing Technologist: Vonzell Schlatter RVT  Examination Guidelines: A complete evaluation includes B-mode imaging, spectral Doppler, color Doppler, and power Doppler as needed of all accessible portions of each vessel. Bilateral testing is considered an integral part of a complete examination. Limited examinations for reoccurring indications may be performed as noted. The reflux portion of the exam is performed with the patient in reverse Trendelenburg.  +---------+---------------+---------+-----------+----------+--------------+ LEFT     CompressibilityPhasicitySpontaneityPropertiesThrombus Aging +---------+---------------+---------+-----------+----------+--------------+ CFV      Full                                                        +---------+---------------+---------+-----------+----------+--------------+ SFJ      Full                                                         +---------+---------------+---------+-----------+----------+--------------+ FV Prox  Full                                                        +---------+---------------+---------+-----------+----------+--------------+ FV Mid   Full                                                        +---------+---------------+---------+-----------+----------+--------------+ FV DistalFull                                                        +---------+---------------+---------+-----------+----------+--------------+  PFV      Full                                                        +---------+---------------+---------+-----------+----------+--------------+ POP      Full                                                        +---------+---------------+---------+-----------+----------+--------------+   Left Technical Findings: Not visualized segments include PTV, PerV.   Summary: LEFT: - There is no evidence of deep vein thrombosis in the lower extremity. However, portions of this examination were limited- see technologist comments above.  - No cystic structure found in the popliteal fossa.  *See table(s) above for measurements and observations. Electronically signed by Ruta Hinds MD on 02/04/2020 at 9:59:12 AM.    Final     Microbiology: No results found for this or any previous visit (from the past 240 hour(s)).   Labs: Basic Metabolic Panel: Recent Labs  Lab 02/10/20 0157 02/11/20 0429 02/12/20 0239 02/13/20 0310 02/14/20 0330  NA 140 143 142 141 139  K 4.7 3.7 3.7 3.8 3.6  CL 98 92* 91* 88* 84*  CO2 28 39* 39* 41* 42*  GLUCOSE 114* 169* 172* 223* 115*  BUN 99* 78* 71* 55* 49*  CREATININE 2.48* 1.84* 1.64* 1.46* 1.38*  CALCIUM 8.5* 8.3* 8.8* 8.9 9.2   Liver Function Tests: No results for input(s): AST, ALT, ALKPHOS, BILITOT, PROT, ALBUMIN in the last 168 hours. No results for input(s): LIPASE, AMYLASE in the last 168 hours. No results for input(s): AMMONIA in  the last 168 hours. CBC: Recent Labs  Lab 02/12/20 0239 02/14/20 0330  HGB 11.9* 12.1  HCT 40.5 39.4   Cardiac Enzymes: No results for input(s): CKTOTAL, CKMB, CKMBINDEX, TROPONINI in the last 168 hours. BNP: BNP (last 3 results) Recent Labs    02/03/20 0221 02/05/20 1106  BNP 1,118.4* 1,233.0*    ProBNP (last 3 results) No results for input(s): PROBNP in the last 8760 hours.  CBG: Recent Labs  Lab 02/13/20 2110 02/13/20 2155 02/14/20 0603 02/14/20 1107 02/14/20 1537  GLUCAP 128* 118* 94 105* 123*       Signed:  Echo [place]sbrook  [facility] 02/14/2020, 4:29 PM

## 2020-05-31 ENCOUNTER — Telehealth: Payer: Self-pay

## 2020-05-31 NOTE — Telephone Encounter (Signed)
Spoke with patient's granddaughter Hughes Better and scheduled an in-person Palliative Consult for 06/29/20 @ 11AM  COVID screening was negative. No pets in home. Patient lives with her husband.  Consent obtained; updated Outlook/Netsmart/Team List and Epic.  Family is aware they may be receiving a call from NP the day before or day of to confirm appointment.

## 2020-06-29 ENCOUNTER — Other Ambulatory Visit: Payer: Self-pay

## 2020-06-29 ENCOUNTER — Telehealth: Payer: Self-pay

## 2020-06-29 ENCOUNTER — Other Ambulatory Visit: Payer: Medicare Other | Admitting: Student

## 2020-06-29 DIAGNOSIS — R0600 Dyspnea, unspecified: Secondary | ICD-10-CM

## 2020-06-29 DIAGNOSIS — Z992 Dependence on renal dialysis: Secondary | ICD-10-CM

## 2020-06-29 DIAGNOSIS — R600 Localized edema: Secondary | ICD-10-CM

## 2020-06-29 DIAGNOSIS — Z515 Encounter for palliative care: Secondary | ICD-10-CM

## 2020-06-29 DIAGNOSIS — I5043 Acute on chronic combined systolic (congestive) and diastolic (congestive) heart failure: Secondary | ICD-10-CM

## 2020-06-29 DIAGNOSIS — N186 End stage renal disease: Secondary | ICD-10-CM

## 2020-06-29 NOTE — Progress Notes (Signed)
[facility] Palliative Care Consult Note Telephone: [phone]  Fax: [phone]    Date of encounter: 06/29/20 PATIENT NAME: Lisa [name] [address] [place] [address]   [phone] (home)  DOB: 1935-10-09 MRN: [id number] PRIMARY CARE PROVIDER:    Colonel Bald, MD,  [address] [place] [address] [phone]  REFERRING PROVIDER:   Colonel Bald, MD [address] [place],  [place] [address] [phone]  RESPONSIBLE PARTY:    Contact Information    Name Relation Home Work Mobile   Lisa [name],Lisa [name] Granddaughter   [phone]   Lisa [name], Lisa [name] Daughter [phone]     Lisa [name], Lisa [name] Granddaughter   [phone]   Lisa [name], Lisa [name]   [phone]   Lisa [name], Lisa [name]   [phone]       I met face to face with patient and family in the home. Interpreter present via telephone as patient/ family speak [place]. Son and husband present. Palliative Care was asked to follow this patient by consultation request of  Paruchuri, Saunders Glance, MD to address advance care planning and complex medical decision making. This is the initial visit.                                     ASSESSMENT AND PLAN / RECOMMENDATIONS:   Advance Care Planning/Goals of Care: Goals include to maximize quality of life and symptom management. Son and husband express wanting patient to be comfortable, continue dialysis, receive additional care/support in the home. Our advance care planning conversation included a discussion about:     The value and importance of advance care planning   Experiences with loved ones who have been seriously ill or have died   Exploration of personal, cultural or spiritual beliefs that might influence medical decisions   Exploration of goals of care in the event of a sudden injury or illness   Decision not to resuscitate or to de-escalate disease focused treatments due to poor prognosis.  CODE  STATUS: DNR  Symptom Management/Plan:  ESRD-currently receiving HD on Tuesday, Thursday, Saturday, Monday. Palliative nurse navigator spoke with [facility]ney center to make the aware of increased edema, question diuretic as none currently in place. Nurse at kidney center states they are aware of increased fluid/edema, reason for additional HD day.   Dyspnea secondary to Heart Failure-continue oxygen at 2lpm. Edema due to HF-elevate legs while in bed. Nephrology to manage diuretic. Continue unna wraps per [place] for lower extremity edema.  Referral made to Palliative SW regarding additional support/care in the home. Family is also requesting an in home provider due to impaired mobility, difficulty transporting patient to/from visits. Palliative Medicine will assist with finding in home provider.   Follow up Palliative Care Visit: Palliative care will continue to follow for complex medical decision making, advance care planning, and clarification of goals. Return in weeks or prn.  I spent 45 minutes providing this consultation. More than 50% of the time in this consultation was spent in counseling and care coordination.   PPS: 40%  HOSPICE ELIGIBILITY/DIAGNOSIS: TBD  Chief Complaint: Palliative Medicine initial consult, ESRD, HF.  HISTORY OF PRESENT ILLNESS:  Lisa [name] is a 85 y.o. year old female  with ESRD, heart failure. Dx also include atrial fibrillation, T2DM, cardiomyopathy, cerebral infarct.  Patient resides at home; husband and family care for patient. She speaks [place]. currently has [facility] nursing and therapy coming with [facility]. Patient is receiving HD 4 times a week per son. She is normally TTHSat and now to start on Mondays as well. Patient with worsening edema, weeping to BLE. [facility] nurse present upon arrival and changing unna wraps. [place] nurse states patient has multiple small areas to LE that are weeping. Son reports abdominal pain when she is constipated; taking senna  prn. Good appetite endorsed. Family is checking blood sugar TID; 136m/dL this AM. Son and husband report patient usually produces very little to no urine. Son expresses need for additional support in the home.    History obtained from review of EMR, discussion with primary team, and interview with family, facility staff/caregiver and/or Lisa [name].  I reviewed available labs, medications, imaging, studies and related documents from the EMR.  Records reviewed and summarized above.   ROS  See HPI for pertinent information; unable to obtain from patient due to impaired hearing and language barrier.    Physical Exam: Pulse 72, respirations 22, b/p 130/60, sats 97% on room air Constitutional: NAD General: frail appearing, chronically ill appearing EYES: anicteric sclera, lids intact, no discharge  ENMT: hard of hearing, oral mucous membranes moist CV: S1S2, RRR, 3+ LE edema, dialysis catheter to right upper chestwall Pulmonary: fine crackles to bilateral bases, no increased work of breathing, occasional non-productive cough Abdomen: normo-active BS + 4 quadrants, soft and non tender GU: deferred MSK: moves extremities, stand/pivot for transfers Skin: warm and dry, [facility] wraps [facility] Neuro: generalized weakness, alert Psych: non-anxious affect Hem/lymph/immuno: no abnormal bleeding   CURRENT PROBLEM LIST:  Patient Active Problem List   Diagnosis Date Noted  . Pressure injury of skin 02/04/2020  . CHF exacerbation ([facility] 02/03/2020  . Pericardial effusion with cardiac tamponade post draaijnage 10/17/15 11/07/2015  . Chest tube in place left 11/07/2015  . Acute on chronic respiratory failure with hypoxia ([facility]   . Pleural effusion   . AKI (acute kidney injury) ([facility] 09/18/2015  . Sinus pause 09/18/2015  . Liver lesion 09/18/2015  . Leukocytosis 09/17/2015  . Elevated troponin 09/17/2015  . Chest pain 09/17/2015  . Uncontrolled type 2 diabetes mellitus with  circulatory disorder, with long-term current use of insulin ([facility] 08/02/2015  . Essential hypertension   . Acute on chronic combined systolic and diastolic heart failure ([facility]   . Aortic stenosis due to bicuspid aortic valve   . OSA on CPAP   . Persistent atrial fibrillation ([facility]   . CKD (chronic kidney disease), stage III ([facility]   . Atrial fibrillation with RVR (HN[place] 08/23/2014   PAST MEDICAL HISTORY:  Active Ambulatory Problems    Diagnosis Date Noted  . Atrial fibrillation with RVR ([facility] 08/23/2014  . Essential hypertension   . Acute on chronic combined systolic and diastolic heart failure ([facility]   . Aortic stenosis due to bicuspid aortic valve   . OSA on CPAP   . Persistent atrial fibrillation (HP[place]   . CKD (chronic kidney disease), stage III ([facility]   . Uncontrolled type 2 diabetes mellitus with circulatory disorder, with long-term current use of insulin (HB[place] 08/02/2015  . Leukocytosis 09/17/2015  . Elevated troponin 09/17/2015  . Chest pain 09/17/2015  . AKI (acute kidney injury) ([facility] 09/18/2015  . Sinus pause 09/18/2015  . Liver lesion 09/18/2015  . Pericardial effusion with cardiac tamponade post draaijnage 10/17/15 11/07/2015  . Chest tube in place left 11/07/2015  . Acute on chronic respiratory failure with hypoxia (HM[place]   . Pleural effusion   .  CHF exacerbation ([place]) 02/03/2020  . Pressure injury of skin 02/04/2020   Resolved Ambulatory Problems    Diagnosis Date Noted  . HTN (hypertension) 08/23/2014  . Acute diastolic CHF (congestive heart failure) ([place]) 08/25/2014  . CKD (chronic kidney disease) stage 4, GFR 15-29 ml/min (HCC)   . Acute exacerbation of CHF (congestive heart failure) ([place]) 09/17/2015   Past Medical History:  Diagnosis Date  . Anemia   . Carotid arterial disease ([place])   . Chronic combined systolic and diastolic CHF (congestive heart failure) ([place])   . Diabetes mellitus without complication ([place])   . Mild aortic stenosis   . Osteoarthritis of  both ankles   . Vitamin D deficiency    SOCIAL HX:  Social History   Tobacco Use  . Smoking status: Former Research scientist (life sciences)  . Smokeless tobacco: Never Used  Substance Use Topics  . Alcohol use: No   FAMILY HX:  Family History  Problem Relation Age of Onset  . Diabetes Mother   . Hyperlipidemia Mother   . Hypertension Mother   . Stroke Sister   . Heart attack Neg Hx       ALLERGIES:  Allergies  Allergen Reactions  . Lisinopril Cough     PERTINENT MEDICATIONS:  Outpatient Encounter Medications as of 06/29/2020  Medication Sig  . acetaminophen (TYLENOL) 500 MG tablet Take 500 mg by mouth every 6 (six) hours as needed for mild pain or headache.  . albuterol (PROVENTIL) (2.5 MG/3ML) 0.083% nebulizer solution Inhale 3 mLs into the lungs every 6 (six) hours as needed for wheezing or shortness of breath.  Marland Kitchen atorvastatin (LIPITOR) 20 MG tablet Take 20 mg by mouth daily.  Marland Kitchen ELIQUIS 2.5 MG TABS tablet Take 2.5 mg by mouth 2 (two) times daily.  . FEROSUL 325 (65 Fe) MG tablet Take 325 mg by mouth every morning.  Marland Kitchen glucose blood (ACCU-CHEK GUIDE) test strip 1 each by Other route 4 (four) times daily. Use as instructed  . insulin aspart (NOVOLOG FLEXPEN) 100 UNIT/ML FlexPen Inject 12 Units into the skin 3 (three) times daily with meals. Pt uses per sliding scale. (Patient taking differently: Inject 8 Units into the skin 3 (three) times daily with meals.)  . Insulin Pen Needle (BD PEN NEEDLE NANO U/F) 32G X 4 MM MISC Four times daily  . isosorbide mononitrate (IMDUR) 60 MG 24 hr tablet Take 60 mg by mouth daily.  . magnesium oxide (MAG-OX) 400 (241.3 Mg) MG tablet Take 400 mg by mouth daily.  . metoprolol succinate (TOPROL-XL) 25 MG 24 hr tablet Take 25 mg by mouth daily.  Marland Kitchen omeprazole (PRILOSEC) 40 MG capsule Take 40 mg by mouth daily.  . OXYGEN Inhale 2 L into the lungs continuous.  . OXYGEN Inhale 5 % into the lungs at bedtime. Cpap  . polyethylene glycol (MIRALAX / GLYCOLAX) packet Take 17 g  by mouth daily. (Patient not taking: No sig reported)  . senna-docusate (SENOKOT-S) 8.6-50 MG tablet Take 2 tablets by mouth daily as needed for mild constipation.  Marland Kitchen spironolactone (ALDACTONE) 25 MG tablet Take 25 mg by mouth daily.  Marland Kitchen torsemide (DEMADEX) 20 MG tablet Take 20 mg by mouth every morning.  Tyler Aas FLEXTOUCH 100 UNIT/ML FlexTouch Pen Inject 0.3 mLs (30 Units total) into the skin at bedtime. (Patient taking differently: Inject 10 Units into the skin at bedtime.)   No facility-administered encounter medications on file as of 06/29/2020.    Thank you for the opportunity to participate in the care  of Ms. Okelly.  The palliative care team will continue to follow. Please call our office at [phone] if we can be of additional assistance.   Ezekiel Slocumb, NP   COVID-19 PATIENT SCREENING TOOL Asked and negative response unless otherwise noted:   Have you had symptoms of covid, tested positive or been in contact with someone with symptoms/positive test in the past 5-10 days? No

## 2020-06-29 NOTE — Telephone Encounter (Signed)
At the direction of Palliative NP, phone call placed to Dr. Dimas Aguas, Nephrologist, to provide update that patient has 3+ pitting edema to lower extremities that are weeping. Spoke with Patent examiner, who shared that provider is aware with orders in place to have patient begin dialysis 4x per week. Will update NP

## 2020-08-03 DEATH — deceased

## 2021-02-22 ENCOUNTER — Telehealth: Payer: Self-pay

## 2021-02-22 NOTE — Telephone Encounter (Signed)
Volunteer called patient/family on behalf of [facility] and did not get a answer from patient/family. ° °
